# Patient Record
Sex: Female | Born: 1935 | Race: Black or African American | Hispanic: No | Marital: Married | Smoking: Never smoker
Health system: Southern US, Community
[De-identification: ages and names within clinical notes are randomized; demographics above are authoritative.]

## PROBLEM LIST (undated history)

## (undated) DIAGNOSIS — C801 Malignant (primary) neoplasm, unspecified: Secondary | ICD-10-CM

## (undated) DIAGNOSIS — M1990 Unspecified osteoarthritis, unspecified site: Secondary | ICD-10-CM

## (undated) DIAGNOSIS — F32A Depression, unspecified: Secondary | ICD-10-CM

## (undated) DIAGNOSIS — H269 Unspecified cataract: Secondary | ICD-10-CM

## (undated) DIAGNOSIS — E785 Hyperlipidemia, unspecified: Secondary | ICD-10-CM

## (undated) DIAGNOSIS — I10 Essential (primary) hypertension: Secondary | ICD-10-CM

## (undated) DIAGNOSIS — T7840XA Allergy, unspecified, initial encounter: Secondary | ICD-10-CM

## (undated) DIAGNOSIS — E119 Type 2 diabetes mellitus without complications: Secondary | ICD-10-CM

## (undated) DIAGNOSIS — K219 Gastro-esophageal reflux disease without esophagitis: Secondary | ICD-10-CM

## (undated) DIAGNOSIS — E079 Disorder of thyroid, unspecified: Secondary | ICD-10-CM

## (undated) HISTORY — DX: Disorder of thyroid, unspecified: E07.9

## (undated) HISTORY — DX: Unspecified osteoarthritis, unspecified site: M19.90

## (undated) HISTORY — PX: EYE SURGERY: SHX253

## (undated) HISTORY — DX: Hyperlipidemia, unspecified: E78.5

## (undated) HISTORY — PX: CATARACT EXTRACTION: SUR2

## (undated) HISTORY — DX: Malignant (primary) neoplasm, unspecified: C80.1

## (undated) HISTORY — DX: Allergy, unspecified, initial encounter: T78.40XA

## (undated) HISTORY — PX: BREAST BIOPSY: SHX20

## (undated) HISTORY — DX: Unspecified cataract: H26.9

## (undated) HISTORY — DX: Depression, unspecified: F32.A

## (undated) HISTORY — DX: Type 2 diabetes mellitus without complications: E11.9

## (undated) HISTORY — PX: TONSILLECTOMY: SUR1361

## (undated) HISTORY — PX: ABDOMINAL HYSTERECTOMY: SHX81

## (undated) HISTORY — DX: Essential (primary) hypertension: I10

## (undated) HISTORY — DX: Gastro-esophageal reflux disease without esophagitis: K21.9

---

## 1992-09-14 HISTORY — PX: THYROIDECTOMY, PARTIAL: SHX18

## 2002-04-04 ENCOUNTER — Ambulatory Visit (HOSPITAL_COMMUNITY): Admission: RE | Admit: 2002-04-04 | Discharge: 2002-04-04 | Payer: Self-pay | Admitting: Cardiology

## 2003-10-30 LAB — HM PAP SMEAR: HM PAP: NORMAL

## 2004-07-01 ENCOUNTER — Inpatient Hospital Stay (HOSPITAL_COMMUNITY): Admission: RE | Admit: 2004-07-01 | Discharge: 2004-07-03 | Payer: Self-pay | Admitting: Gynecology

## 2004-07-01 ENCOUNTER — Encounter (INDEPENDENT_AMBULATORY_CARE_PROVIDER_SITE_OTHER): Payer: Self-pay | Admitting: Specialist

## 2004-07-29 ENCOUNTER — Ambulatory Visit: Payer: Self-pay | Admitting: Family Medicine

## 2006-03-30 ENCOUNTER — Ambulatory Visit: Payer: Self-pay | Admitting: Family Medicine

## 2007-02-14 ENCOUNTER — Ambulatory Visit (HOSPITAL_COMMUNITY): Admission: RE | Admit: 2007-02-14 | Discharge: 2007-02-14 | Payer: Self-pay | Admitting: Cardiology

## 2007-06-28 ENCOUNTER — Ambulatory Visit: Payer: Self-pay | Admitting: Family Medicine

## 2008-01-03 ENCOUNTER — Ambulatory Visit: Payer: Self-pay | Admitting: Family Medicine

## 2008-11-28 ENCOUNTER — Ambulatory Visit: Payer: Self-pay | Admitting: Family Medicine

## 2010-02-13 ENCOUNTER — Ambulatory Visit: Payer: Self-pay | Admitting: Family Medicine

## 2010-09-23 ENCOUNTER — Ambulatory Visit (HOSPITAL_COMMUNITY)
Admission: RE | Admit: 2010-09-23 | Discharge: 2010-09-23 | Payer: Self-pay | Source: Home / Self Care | Attending: Ophthalmology | Admitting: Ophthalmology

## 2011-01-30 NOTE — H&P (Signed)
NAME:  [name], [name]              ACCOUNT NO.:  [id number]   MEDICAL RECORD NO.:  [id number]          PATIENT TYPE:  INP   LOCATION:  NA                            FACILITY:  [facility]   PHYSICIAN:  Ginger Carne, MD  DATE OF BIRTH:  Aug 21, 1933   DATE OF ADMISSION:  DATE OF DISCHARGE:                                HISTORY & PHYSICAL   ADMISSION DIAGNOSIS:  Postmenopausal bleeding with pedunculated uterine  fibroids/polyps.   IN-HOSPITAL PROCEDURE:  Total vaginal hysterectomy, bilateral salpingo-  oophorectomy, and possible laparoscopic assistance.   PRESENT HISTORY:  The patient is a 77 year old gravida 3, para 3-0-0-3,  African-American female admitted for the aforementioned procedures because  of postmenopausal bleeding.  The patient's primary care physician referred  said patient after a six to eight-week history of off and on spotting.  The  patient was noted on transvaginal ultrasound to have and on physical  inspection to have two large pedunculated leiomyoma, which apparently had  prolapsed through the endocervical canal and were present in the vagina.  They extended approximately 2-3 cm from the ectocervix.  The patient takes  no medications to enhance her bleeding propensity and has no personal or  family history of bleeding diatheses.  Endometrial biopsy revealed no  evidence of neoplasia or hyperplasia and findings were also demonstrating a  portion of a polyp.   OBSTETRIC/GYNECOLOGIC HISTORY:  The patient has had three full-term vaginal  deliveries in 1960, 1963, and 1970.  She had cryosurgery for dysplasia in  the 1970s.   MEDICAL HISTORY:  1.  Carcinoma of the thyroid in 1994.  2.  History of type 2 diabetes.  3.  Hypertension.   MEDICATIONS:  1.  Prilosec over-the-counter 20 mg daily.  2.  Glipizide ER 5 mg once a day.  3.  Accuretic 20/25 mg once a day.  4.  Synthroid 100 mcg daily.  5.  Lovastatin 40 mg at night.  6.  Vitamin C.  7.  Flax oil.  8.   Vitamin E.   FAMILY HISTORY:  Negative for breast, colon, ovarian, or uterine carcinoma  in first degree relatives.   The patient has no known allergies or sensitivities to iodine or latex.   OCCUPATION:  Retired.   SOCIAL HISTORY:  Negative for smoking, illicit drug abuse, or alcohol abuse.   SURGICAL HISTORY:  Thyroidectomy.   PHYSICAL EXAMINATION:  VITAL SIGNS:  Blood pressure is 120/60, weight 200  pounds, height 5 feet 3 inches.  HEENT:  Grossly normal.  BREASTS:  Without masses, discharge, thickenings, or tenderness.  CHEST:  Clear to percussion and auscultation.  CARDIOVASCULAR:  Without murmurs or enlargements, regular rate and rhythm.  EXTREMITIES, LYMPHATIC, SKIN, NEUROLOGIC, MUSCULOSKELETAL:  Systems within  normal limits.  ABDOMEN:  Soft without gross hepatosplenomegaly.  PELVIC:  External genitalia, vulva, and vagina normal.  Cervix smooth  without erosions or lesions on the cervix proper.  Present at the ectocervix  and extending about 2-3 cm from the ectocervix are two leiomyoma/polyps.  Uterus is small and appropriate for patient's age.  Both adnexa are  palpable, found to  be normal.  RECTAL:  Hemoccult-negative without masses.   IMPRESSION:  Postmenopausal bleeding secondary to prolapsed, pedunculated  uterine fibroids/polyps.   PLAN:  Discussed with patient the option of a hysteroscopic removal with  possibility of being unable to remove said base or stumps of said lesions,  which may cause further bleeding and/or further surgery.  The patient was  not open to the possibility of an incomplete resection and preferred a  vaginal hysterectomy and removal of both tubes and ovaries.  Risks,  including injuries to ureter, bowel, and bladder; possible conversion to a  laparoscopic or open procedure; hemorrhage possibly requiring blood  transfusion; infection; postoperative complications related to the patient's  medical diseases were discussed and understood by said  patient.  The patient  is therefore scheduled for a total vaginal hysterectomy and bilateral  salpingo-oophorectomy.  The nature of said procedure discussed in detail.  Pros and cons and options were also discussed thoroughly with patient and  husband and daughter.     Stev   SHB/MEDQ  D:  06/30/2004  T:  06/30/2004  Job:  [id number]

## 2011-01-30 NOTE — Op Note (Signed)
NAME:  [name] [name], [name] [name]              ACCOUNT NO.:  [id number]   MEDICAL RECORD NO.:  [id number]          PATIENT TYPE:  INP   LOCATION:  9399                          [facility]ACILITY:  [facility]   PHYSICIAN:  Ginger Carne, MD  DATE OF BIRTH:  [birth year]   DATE OF PROCEDURE:  DATE OF DISCHARGE:                                 OPERATIVE REPORT   PREOPERATIVE DIAGNOSIS:  Pedunculated prolapsed leiomyoma of the uterus and  subserosal leiomyoma.   POSTOPERATIVE DIAGNOSIS:  Pedunculated prolapsed leiomyoma of the uterus and  subserosal leiomyoma.   PROCEDURE:  Total vaginal hysterectomy with preservation of both tubes and  ovaries.   SURGEON:  Ginger Carne, M.D.   ASSISTANT:  Burnadette Peter, M.D.   ESTIMATED BLOOD LOSS:  Less than 50 to 75 mL.   COMPLICATIONS:  None immediate.   ANESTHESIA:  General anesthesia.   SPECIMENS:  Uterus and cervix.   FINDINGS:  Uterus was approximately four weeks in size compatible with post  menopausal size uterus.  There were two pedunculated fibroids than emanated  from the fundus with broad bases.  In addition, there was a 3 cm leiomyoma  on the subserosal intramural portion of the uterus at the level of the  fundus.  Ovaries and tubes were too high to safely remove vaginally.  Tissue  was somewhat friable through the course of the procedure and was deemed  inappropriate to attempt to remove said ovaries and incur the possibility of  further bleeding.   DESCRIPTION OF PROCEDURE:  The patient prepped and draped in usual fashion  and placed in the lithotomy position.  Betadine soap was used for antiseptic  and the patient was catheterized prior to the procedure.  After adequate  general anesthesia, anterior and posterior lifts of the cervix were grasped  with a double tooth tenaculum.  There were 20 mL of Marcaine with  epinephrine injected circumferentially around the cervix.  There were 2 cm  of anterior and posterior vaginal epithelium incised  transversely.  This was  then followed by opening the peritoneal reflections anteriorly and  posteriorly without injury to their respective organs.  Uterosacral cardinal  ligament complexes clamped, cut and ligated with 0 Vicryl suture.  This  extended to the uterine vasculature with its ascending branches.  Afterwards, the broad ligaments were clamped, cut and ligated with 0 Vicryl  suture. The round ligament was taken separately and utero-ovarian ligaments  clamped, cut and ligated with 0 Vicryl suture.  Bleeding points  hemostatically checked. Clots removed from the abdomen, closed with cuff in  one layer with 0 Vicryl running interlocking suture.  The patient tolerated  the procedure well and returned to the post anesthesia recovery room in  excellent condition.     Stev   SHB/MEDQ  D:  07/01/2004  T:  07/01/2004  Job:  [id number]

## 2011-01-30 NOTE — Cardiovascular Report (Signed)
[facility]  Patient:    [name], [name] Visit Number: [id number] MRN: [id number]          Service Type: DSU Location: [facility] 2859 [id number] Attending Physician:  Robynn Pane Dictated by:   Eduardo Osier Sharyn Lull, M.D. Proc. Date: 04/04/02 Admit Date:  04/04/2002 Discharge Date: 04/04/2002                          Cardiac Catheterization  PROCEDURES: 1. Left cardiac catheterization. 2. Selective left and right coronary angiography. 3. Left ventriculography via right groin using Judkins technique.  INDICATIONS FOR PROCEDURE:  The patient is a 77 year old black female with a past medical history significant for hypertension, diabetes mellitus, hypercholesterolemia, hypothyroidism, morbid obesity.  She is complains of retrosternal chest tightness, associated with nausea, vomiting, feeling weak for approximately two weeks.  The patient saw PMD in [place], [place] and subsequently had a stress Cardiolite at [facility], which showed partial fixed inferior wall defect with incomplete redistribution consistent with scar and peri-infarct ischemia in the inferior wall, with normal LV function.  The patient is referred for catheterization, possible PTC and stenting.  The patient denies any palpitations, lightheadedness or syncope.  No history of PND, orthopnea or leg swelling.  PAST MEDICAL HISTORY:  As above.  PAST SURGICAL HISTORY:  She had partial ________ many years ago.  Status post Bartholin cyst resection many years ago.  Status post tonsillectomy as a child.  ALLERGIES:  No known drug allergies.  HOME MEDICATIONS: 1. ________ 20/25 p.o. q.d. 2. Synthroid 0.1 mg p.o. q.d. 3. Glucotrol XL 2.5 mg p.o. q.d. 4. Lipitor 20 mg p.o. q.d. 5. Enteric-coated aspirin one p.o. q.d. 6. Nitrostat 0.4 mg sublingual as directed. 7. Norvasc (recently added) 5 mg p.o. q.d.  SOCIAL HISTORY:  She is married.  Has three children.  No smoking or alcohol abuse.   She is a retired [employer] features editor.  FAMILY HISTORY:  Negative for coronary artery disease.  PHYSICAL EXAMINATION:  GENERAL:  She was alert and oriented x3.  In no acute distress.  VITAL SIGNS:  Blood pressure 160/90, pulse 57 and regular.  HEENT:  Conjunctivae pink.  NECK:  Supple, no JVD and no bruit.  LUNGS:  Clear to auscultation without rhonchi or rales.  CARDIOVASCULAR:  S1, S2 normal.  There was a soft S4 gallop.  ABDOMEN:  Soft, bowel sounds were present.  Nontender.  EXTREMITIES:  There is no clubbing, cyanosis or edema.  IMPRESSION: 1. Accelerated angina, positive stress Cardiolite. 2. Uncontrolled hypertension. 3. Noninsulin-dependent diabetes mellitus. 4. Hypercholesterolemia. 5. Hypothyroidism. 6. Morbid obesity.  PLAN:  Discussed with the patient and her husband regarding left catheterization, possible PTCA and stenting; its risks including MI, stroke, need for emergency CABG, risks of restenosis, localized other complications. The patient has accepted and consented for PCI.  DESCRIPTION OF PROCEDURE:  After obtaining the informed consent, the patient was brought to the catheterization lab and was placed on the fluoroscopy table.  The right groin was prepped and draped in the usual fashion. Xylocaine 2% was used for local anesthesia in the right groin.  With the help of a thin-walled needle, a 6-French arterial sheath was placed.  The sheath was aspirated and flushed.  Next, 6-French left Judkins catheter was advanced over the wire under fluoroscopic guidance up to the ascending aorta.  The wire was pulled out. The catheter was aspirated and connected to the manifold.  The catheter was  further advanced and engaged into the right coronary ostium.  Multiple views of the left system were taken.  Next, the catheter was disengaged and was pulled out over the wire; replaced with 6-French right Judkins catheter.  This was advanced over the wire  under fluoroscopic guidance up to the ascending aorta.  The wire was pulled out. The catheter was aspirated and connected to the manifold. The catheter was further advanced and engaged into the right coronary ostium.  Multiple views of the right system were taken.  Next, the catheter was disengaged and was pulled out over the wire.  Replaced with 6-French pigtail catheter.  This was advanced over the wire under fluoroscopic guidance up to the ascending aorta.  The wire was pulled out, the catheter was aspirated and connected to the manifold.  The catheter was further advanced across the aortic valve into the LV.  LV pressures were recorded.  Next, left ventriculography was done in the 30-degree RAO position. Post-angiographic pressures were recorded from the LV and then pullback pressures were recorded from the aorta.  There was no gradient across the aortic valve.  Next, the pigtail catheter was pulled out over the wire and the sheaths were aspirated and flushed.  FINDINGS:  LEFT VENTRICULOGRAM:  The left ventricle showed good LV systolic function.  EF of 55-60%.  CORONARIES: 1. LEFT MAIN:  Has 10% ostial stenosis. 2. LEFT ANTERIOR DESCENDING:  Patent.  The first diagonal branch was medium    sized and had 20-25% ostial stenosis. 3. LEFT CIRCUMFLEX:  Large and patent.  This tapers down after giving    off OM2 in the AV groove.  OM1 is very small but is patent.  OM2 is    medium sized and patent. 4. RIGHT CORONARY:  Has 15-20% ostial and proximal stenosis.  There is    20-25% mid focal stenosis, with haziness and TIMI-3 distal flow.  DISPOSITION:  The patient tolerated the procedure well.  There were no complications.  PLAN:  Treat medically. Dictated by:   Eduardo Osier Sharyn Lull, M.D. Attending Physician:  Robynn Pane DD:  04/04/02 TD:  04/08/02 Job: [id number]

## 2011-01-30 NOTE — Discharge Summary (Signed)
NAME:  [name] [name], [name] [name]              ACCOUNT NO.:  [id number]   MEDICAL RECORD NO.:  [id number]          PATIENT TYPE:  INP   LOCATION:  9307                          [facility]ILITY:  [facility]   PHYSICIAN:  Ginger Carne, MD  DATE OF BIRTH:  Jun 24, 1933   DATE OF ADMISSION:  07/01/2004  DATE OF DISCHARGE:  07/03/2004                                 DISCHARGE SUMMARY   FINAL DIAGNOSES:  Postmenopausal bleeding with pedunculated uterine  fibroids.   IN HOSPITAL PROCEDURES:  Total vaginal hysterectomy with preservation of  both tubes and ovaries.   HOSPITAL COURSE:  This is a 77 year old African-American female who  underwent the aforementioned procedures on the 18th of October 2005. Her  postoperative course was uneventful.  Ovaries were not removed due to the  high position of said ovaries and the risk of bleeding.  She was afebrile,  voided well, minimal vaginal flow, calves without tenderness, lungs clear.  Her postoperative hemoglobin was 11.3 from a preop of 13.8 and 33.3,  hematocrit from 41.9 preop. Routine postoperative instructions provided to  said patient, Vicodin provided for analgesia.  The patient was asked to  return to the office in four weeks and to contact the office for temperature  elevation about 100.4 degrees centigrade, increased vaginal discharge,  burning  on urination, problems with bowel function and/or any other  concerns or complaints.     Stev   SHB/MEDQ  D:  07/03/2004  T:  07/03/2004  Job:  [id number]

## 2013-03-20 ENCOUNTER — Ambulatory Visit: Payer: Self-pay | Admitting: Family Medicine

## 2013-04-07 ENCOUNTER — Ambulatory Visit: Payer: Self-pay | Admitting: Orthopedic Surgery

## 2014-03-26 DIAGNOSIS — M48061 Spinal stenosis, lumbar region without neurogenic claudication: Secondary | ICD-10-CM | POA: Insufficient documentation

## 2014-03-26 DIAGNOSIS — M5116 Intervertebral disc disorders with radiculopathy, lumbar region: Secondary | ICD-10-CM | POA: Insufficient documentation

## 2014-03-27 DIAGNOSIS — M5136 Other intervertebral disc degeneration, lumbar region: Secondary | ICD-10-CM | POA: Insufficient documentation

## 2014-04-28 LAB — HM MAMMOGRAPHY: HM MAMMO: NORMAL

## 2014-10-02 DIAGNOSIS — E119 Type 2 diabetes mellitus without complications: Secondary | ICD-10-CM | POA: Diagnosis not present

## 2014-10-18 DIAGNOSIS — M5416 Radiculopathy, lumbar region: Secondary | ICD-10-CM | POA: Diagnosis not present

## 2014-10-18 DIAGNOSIS — M4806 Spinal stenosis, lumbar region: Secondary | ICD-10-CM | POA: Diagnosis not present

## 2014-12-17 DIAGNOSIS — Z23 Encounter for immunization: Secondary | ICD-10-CM | POA: Diagnosis not present

## 2014-12-17 DIAGNOSIS — E119 Type 2 diabetes mellitus without complications: Secondary | ICD-10-CM | POA: Diagnosis not present

## 2014-12-17 DIAGNOSIS — R5383 Other fatigue: Secondary | ICD-10-CM | POA: Diagnosis not present

## 2014-12-17 DIAGNOSIS — E785 Hyperlipidemia, unspecified: Secondary | ICD-10-CM | POA: Diagnosis not present

## 2014-12-17 DIAGNOSIS — I10 Essential (primary) hypertension: Secondary | ICD-10-CM | POA: Diagnosis not present

## 2014-12-17 DIAGNOSIS — E039 Hypothyroidism, unspecified: Secondary | ICD-10-CM | POA: Diagnosis not present

## 2014-12-17 DIAGNOSIS — Z0000 Encounter for general adult medical examination without abnormal findings: Secondary | ICD-10-CM | POA: Diagnosis not present

## 2014-12-17 DIAGNOSIS — E538 Deficiency of other specified B group vitamins: Secondary | ICD-10-CM | POA: Diagnosis not present

## 2014-12-17 LAB — LIPID PANEL
Cholesterol: 219 mg/dL — AB (ref 0–200)
HDL: 48 mg/dL (ref 35–70)
LDL Cholesterol: 152 mg/dL
Triglycerides: 97 mg/dL (ref 40–160)

## 2014-12-17 LAB — TSH: TSH: 0.74 u[IU]/mL (ref <=5.90)

## 2014-12-17 LAB — HEMOGLOBIN A1C: HEMOGLOBIN A1C: 6.4 % — AB (ref 4.0–6.0)

## 2014-12-31 DIAGNOSIS — R42 Dizziness and giddiness: Secondary | ICD-10-CM | POA: Insufficient documentation

## 2014-12-31 DIAGNOSIS — M549 Dorsalgia, unspecified: Secondary | ICD-10-CM | POA: Insufficient documentation

## 2014-12-31 DIAGNOSIS — D249 Benign neoplasm of unspecified breast: Secondary | ICD-10-CM | POA: Insufficient documentation

## 2014-12-31 DIAGNOSIS — E039 Hypothyroidism, unspecified: Secondary | ICD-10-CM | POA: Insufficient documentation

## 2014-12-31 DIAGNOSIS — M1990 Unspecified osteoarthritis, unspecified site: Secondary | ICD-10-CM | POA: Insufficient documentation

## 2014-12-31 DIAGNOSIS — B351 Tinea unguium: Secondary | ICD-10-CM | POA: Diagnosis not present

## 2014-12-31 DIAGNOSIS — E7849 Other hyperlipidemia: Secondary | ICD-10-CM | POA: Insufficient documentation

## 2014-12-31 DIAGNOSIS — M76899 Other specified enthesopathies of unspecified lower limb, excluding foot: Secondary | ICD-10-CM | POA: Insufficient documentation

## 2014-12-31 DIAGNOSIS — E1142 Type 2 diabetes mellitus with diabetic polyneuropathy: Secondary | ICD-10-CM | POA: Diagnosis not present

## 2014-12-31 DIAGNOSIS — R600 Localized edema: Secondary | ICD-10-CM | POA: Diagnosis not present

## 2014-12-31 DIAGNOSIS — M5186 Other intervertebral disc disorders, lumbar region: Secondary | ICD-10-CM | POA: Insufficient documentation

## 2014-12-31 DIAGNOSIS — M25559 Pain in unspecified hip: Secondary | ICD-10-CM | POA: Insufficient documentation

## 2015-01-14 DIAGNOSIS — H2513 Age-related nuclear cataract, bilateral: Secondary | ICD-10-CM | POA: Diagnosis not present

## 2015-01-17 DIAGNOSIS — E119 Type 2 diabetes mellitus without complications: Secondary | ICD-10-CM | POA: Diagnosis not present

## 2015-02-08 DIAGNOSIS — E119 Type 2 diabetes mellitus without complications: Secondary | ICD-10-CM | POA: Diagnosis not present

## 2015-03-27 DIAGNOSIS — E039 Hypothyroidism, unspecified: Secondary | ICD-10-CM | POA: Insufficient documentation

## 2015-03-27 DIAGNOSIS — E538 Deficiency of other specified B group vitamins: Secondary | ICD-10-CM | POA: Insufficient documentation

## 2015-03-27 DIAGNOSIS — D249 Benign neoplasm of unspecified breast: Secondary | ICD-10-CM | POA: Insufficient documentation

## 2015-03-27 DIAGNOSIS — M519 Unspecified thoracic, thoracolumbar and lumbosacral intervertebral disc disorder: Secondary | ICD-10-CM | POA: Insufficient documentation

## 2015-03-27 DIAGNOSIS — R609 Edema, unspecified: Secondary | ICD-10-CM | POA: Insufficient documentation

## 2015-03-27 DIAGNOSIS — M48061 Spinal stenosis, lumbar region without neurogenic claudication: Secondary | ICD-10-CM | POA: Insufficient documentation

## 2015-03-27 DIAGNOSIS — M1990 Unspecified osteoarthritis, unspecified site: Secondary | ICD-10-CM | POA: Insufficient documentation

## 2015-03-27 DIAGNOSIS — M7070 Other bursitis of hip, unspecified hip: Secondary | ICD-10-CM | POA: Insufficient documentation

## 2015-03-27 DIAGNOSIS — M7550 Bursitis of unspecified shoulder: Secondary | ICD-10-CM | POA: Insufficient documentation

## 2015-03-27 DIAGNOSIS — E739 Lactose intolerance, unspecified: Secondary | ICD-10-CM | POA: Insufficient documentation

## 2015-03-27 DIAGNOSIS — R351 Nocturia: Secondary | ICD-10-CM | POA: Insufficient documentation

## 2015-03-27 DIAGNOSIS — E785 Hyperlipidemia, unspecified: Secondary | ICD-10-CM | POA: Insufficient documentation

## 2015-03-27 DIAGNOSIS — I10 Essential (primary) hypertension: Secondary | ICD-10-CM | POA: Insufficient documentation

## 2015-03-27 DIAGNOSIS — E1149 Type 2 diabetes mellitus with other diabetic neurological complication: Secondary | ICD-10-CM | POA: Insufficient documentation

## 2015-04-01 ENCOUNTER — Encounter: Payer: Self-pay | Admitting: Family Medicine

## 2015-04-01 ENCOUNTER — Ambulatory Visit (INDEPENDENT_AMBULATORY_CARE_PROVIDER_SITE_OTHER): Payer: Commercial Managed Care - HMO | Admitting: Family Medicine

## 2015-04-01 VITALS — BP 144/74 | HR 58 | Temp 98.9°F | Resp 16 | Wt 179.0 lb

## 2015-04-01 DIAGNOSIS — E785 Hyperlipidemia, unspecified: Secondary | ICD-10-CM | POA: Diagnosis not present

## 2015-04-01 DIAGNOSIS — E039 Hypothyroidism, unspecified: Secondary | ICD-10-CM

## 2015-04-01 DIAGNOSIS — R358 Other polyuria: Secondary | ICD-10-CM

## 2015-04-01 DIAGNOSIS — M519 Unspecified thoracic, thoracolumbar and lumbosacral intervertebral disc disorder: Secondary | ICD-10-CM

## 2015-04-01 DIAGNOSIS — R3589 Other polyuria: Secondary | ICD-10-CM | POA: Insufficient documentation

## 2015-04-01 DIAGNOSIS — E119 Type 2 diabetes mellitus without complications: Secondary | ICD-10-CM | POA: Diagnosis not present

## 2015-04-01 DIAGNOSIS — I10 Essential (primary) hypertension: Secondary | ICD-10-CM | POA: Diagnosis not present

## 2015-04-01 LAB — POCT URINALYSIS DIPSTICK
Bilirubin, UA: NEGATIVE
Glucose, UA: NEGATIVE
KETONES UA: NEGATIVE
Leukocytes, UA: NEGATIVE
NITRITE UA: NEGATIVE
PROTEIN UA: NEGATIVE
RBC UA: NEGATIVE
Spec Grav, UA: 1.01
UROBILINOGEN UA: 0.2
pH, UA: 7

## 2015-04-01 MED ORDER — SOLIFENACIN SUCCINATE 5 MG PO TABS: 5.0000 mg | ORAL_TABLET | Freq: Every day | ORAL | Status: DC

## 2015-04-01 NOTE — Progress Notes (Signed)
Patient: [name] [name] Female    DOB: [birth year]   81 y.o.   MRN: [id number] Visit Date: 04/01/2015  Today's Provider: Lelon Huh, MD   Chief Complaint  Patient presents with  . Follow-up    Medication and labs  . Hypertension  . Hyperlipidemia  . Diabetes  . Hypothyroidism   Subjective:    Hypertension This is a chronic problem. The current episode started more than 1 year ago. The problem is controlled. Associated symptoms include headaches and malaise/fatigue. Pertinent negatives include no chest pain, palpitations or shortness of breath. Risk factors for coronary artery disease include diabetes mellitus.  Hyperlipidemia This is a chronic problem. The current episode started more than 1 year ago. Exacerbating diseases include diabetes and hypothyroidism. Associated symptoms include leg pain. Pertinent negatives include no chest pain or shortness of breath.  Diabetes She presents for her follow-up diabetic visit. She has type 2 diabetes mellitus. Hypoglycemia symptoms include headaches. Pertinent negatives for hypoglycemia include no dizziness. Associated symptoms include fatigue and weakness. Pertinent negatives for diabetes include no chest pain. Risk factors for coronary artery disease include hypertension.     Diabetes Mellitus Type II, Follow-up:   Lab Results  Component Value Date   HGBA1C 6.4* 12/17/2014   Last seen for diabetes 3 months ago.  Management changes included none. She reports good compliance with treatment. She is not having side effects. none Home blood sugar records: fasting range: 76  Episodes of hypoglycemia? no   Current exercise: none  ------------------------------------------------------------------------   Hypertension, follow-up:  BP Readings from Last 3 Encounters:  04/01/15 144/74  12/17/14 130/72    She was last seen for hypertension 3 months ago.  BP at that visit was 130/72. Management changes since that visit  include none .She reports good compliance with treatment. She is not having side effects. npne  She is not exercising. She is adherent to low salt diet.   Outside blood pressures are none. She is experiencing none.  Use of agents associated with hypertension: none.   ------------------------------------------------------------------------    Lipid/Cholesterol, Follow-up:   Last seen for this 3 months ago.  Management changes since that visit include continue Zetia, discontinue HCTZ. Added Furosemide 20 mg qd.  Last Lipid Panel:    Component Value Date/Time   CHOL 219* 12/17/2014   TRIG 97 12/17/2014   HDL 48 12/17/2014   LDLCALC 152 12/17/2014    She reports good compliance with treatment. She is not having side effects. none  Wt Readings from Last 3 Encounters:  04/01/15 179 lb (81.194 kg)  12/17/14 177 lb (80.287 kg)    ------------------------------------------------------------------------   Polyuria She states she continues to have to void ever couple of hours during the day, and several times throughout the night. She has stopped hctz but continues furosemide due to chronic edema of LEs, and finds that it is effective.     Allergies  Allergen Reactions  . Lovastatin     Weakness  . Pravastatin Sodium     Muscle weakness  . Simvastatin     Fatigue   Previous Medications   AMLODIPINE (NORVASC) 5 MG TABLET    Take 1 tablet by mouth daily.   ASPIRIN 81 MG TABLET    Take 1 tablet by mouth daily.   CHOLECALCIFEROL (VITAMIN D) 1000 UNITS TABLET    Take 1 tablet by mouth daily.   EZETIMIBE (ZETIA) 10 MG TABLET    Take 10 mg by mouth daily.  FUROSEMIDE (LASIX) 20 MG TABLET    Take 20 mg by mouth.   GLIPIZIDE (GLUCOTROL XL) 10 MG 24 HR TABLET    Take 1 tablet by mouth daily.   LEVOTHYROXINE (SYNTHROID, LEVOTHROID) 75 MCG TABLET    Take 1 tablet by mouth daily.   METFORMIN (GLUCOPHAGE-XR) 750 MG 24 HR TABLET    Take 1 tablet by mouth 2 (two) times daily.    OMEGA-3 FATTY ACIDS (FISH OIL) 1000 MG CAPS    Take 1 capsule by mouth daily.   QUINAPRIL (ACCUPRIL) 40 MG TABLET    Take 2 tablets by mouth daily.   VITAMIN B-12 (CYANOCOBALAMIN) 100 MCG TABLET    Take 1 tablet by mouth daily.    Review of Systems  Constitutional: Positive for malaise/fatigue and fatigue.  Respiratory: Negative for shortness of breath.   Cardiovascular: Positive for leg swelling. Negative for chest pain and palpitations.  Genitourinary: Positive for frequency.  Musculoskeletal: Positive for back pain and joint swelling.  Neurological: Positive for weakness, light-headedness and headaches. Negative for dizziness.    History  Substance Use Topics  . Smoking status: Never Smoker   . Smokeless tobacco: Not on file  . Alcohol Use: No   Objective:   BP 144/74 mmHg  Pulse 58  Temp(Src) 98.9 F (37.2 C) (Oral)  Resp 16  Wt 179 lb (81.194 kg)  SpO2 97%  Physical Exam  General Appearance:    Alert, cooperative, no distress  Eyes:    PERRL, conjunctiva/corneas clear, EOM's intact       Lungs:     Clear to auscultation bilaterally, respirations unlabored  Heart:    Regular rate and rhythm  Neurologic:   Awake, alert, oriented x 3. No apparent focal neurological           defect.           Assessment & Plan:      1. HLD (hyperlipidemia) She is tolerating Zetia well, but is expensive. She wants to see how well it is working before refilling prescription.  - Lipid panel  2. Essential hypertension well controlled Continue current medications.    3. Hypothyroidism, unspecified hypothyroidism type   4. Polyuria Try samples of Vesicare 5mg  daily #14.  - POCT urinalysis dipstick  5. Lumbar disc disease Has had low back pain for years and tried shots from Dr. Sharlet Salina with minimal improvement. She ha been very reluctant to try PT, but is now willing since nothing else has helped.  - Ambulatory referral to Physical Therapy  6. Type 2 diabetes mellitus without  complication  - Hemoglobin A1c          Lelon Huh, MD  [facility]

## 2015-04-02 LAB — LIPID PANEL
CHOL/HDL RATIO: 4.9 ratio — AB (ref 0.0–4.4)
CHOLESTEROL TOTAL: 202 mg/dL — AB (ref 100–199)
HDL: 41 mg/dL (ref >39)
LDL Calculated: 140 mg/dL — ABNORMAL HIGH (ref 0–99)
TRIGLYCERIDES: 105 mg/dL (ref 0–149)
VLDL CHOLESTEROL CAL: 21 mg/dL (ref 5–40)

## 2015-04-02 LAB — HEMOGLOBIN A1C
ESTIMATED AVERAGE GLUCOSE: 146 mg/dL
Hgb A1c MFr Bld: 6.7 % — ABNORMAL HIGH (ref 4.8–5.6)

## 2015-04-15 ENCOUNTER — Telehealth: Payer: Self-pay | Admitting: Family Medicine

## 2015-04-15 NOTE — Telephone Encounter (Signed)
It does not sound like a dangerous reaction, so it just depends if she thinks it is helping and how much the side effects bother her. However, I've never heard of a reaction llke this before, so I'm not sure the medications is causing it.

## 2015-04-15 NOTE — Telephone Encounter (Signed)
Pt states she started the Rx solifenacin (VESICARE) 5 MG and pt states she is having a reaction to this medication.  Pt was having a biting feeling on your right arm and back shoulder to her leg.  Pt did not take a pill this morning.  Pt is asking if she needs to stop taking this?  MO#[phone]/PT

## 2015-04-15 NOTE — Telephone Encounter (Signed)
Patient's daughter was notified. Daughter stated that they were going to wait a few days to see how patient is doing and then let us know.

## 2015-04-18 ENCOUNTER — Ambulatory Visit: Payer: Commercial Managed Care - HMO | Admitting: Physical Therapy

## 2015-04-22 ENCOUNTER — Encounter: Payer: Self-pay | Admitting: Physical Therapy

## 2015-04-24 ENCOUNTER — Encounter: Payer: Self-pay | Admitting: Physical Therapy

## 2015-04-29 ENCOUNTER — Ambulatory Visit: Payer: Commercial Managed Care - HMO | Attending: Family Medicine

## 2015-04-29 ENCOUNTER — Encounter: Payer: Self-pay | Admitting: Physical Therapy

## 2015-04-29 DIAGNOSIS — M519 Unspecified thoracic, thoracolumbar and lumbosacral intervertebral disc disorder: Secondary | ICD-10-CM | POA: Diagnosis not present

## 2015-04-29 DIAGNOSIS — R262 Difficulty in walking, not elsewhere classified: Secondary | ICD-10-CM | POA: Insufficient documentation

## 2015-04-29 DIAGNOSIS — R531 Weakness: Secondary | ICD-10-CM | POA: Insufficient documentation

## 2015-04-29 DIAGNOSIS — M545 Low back pain: Secondary | ICD-10-CM | POA: Insufficient documentation

## 2015-04-29 DIAGNOSIS — E119 Type 2 diabetes mellitus without complications: Secondary | ICD-10-CM | POA: Diagnosis not present

## 2015-04-29 NOTE — Patient Instructions (Addendum)
Adduction: Hip - Knees Together (Sitting)   Sit with towel roll between knees. Push knees together. Hold for _5__ seconds. Make sure you breathe!  Repeat __10_ times. Do _3__ times a day.  Copyright  VHI. All rights reserved.   Scapular Retraction: Bilateral   Pull arms back, bringing shoulder blades together.   Hold position for 5 seconds. Breathe!  Repeat __10__ times per set. Do __3__ sets per session. Do _1___ sessions per day.  http://orth.exer.us/177   Copyright  VHI. All rights reserved.    Improved exercise technique, movement at target joints, use of target muscles after mod verbal, visual, tactile cues.

## 2015-04-29 NOTE — Therapy (Signed)
[place] [facility] [address], [place], [address] Phone: [phone]   Fax:  [phone]  Physical Therapy Evaluation  Patient Details  Name: [name] [name] MRN: [id number] Date of Birth: 02-26-1933 Referring Provider:  Birdie Sons, MD  Encounter Date: 04/29/2015      PT End of Session - 04/29/15 0814    Visit Number 1   Number of Visits 9   Date for PT Re-Evaluation 05/30/15   Authorization Type 1   Authorization Time Period of 10   PT Start Time 0814   PT Stop Time 0955   PT Time Calculation (min) 101 min   Activity Tolerance Patient tolerated treatment well   Behavior During Therapy [facility] for tasks assessed/performed      Past Medical History  Diagnosis Date  . Hyperlipidemia   . Hypertension   . Diabetes mellitus without complication   . Arthritis   . Allergy   . Thyroid disease     Past Surgical History  Procedure Laterality Date  . Abdominal hysterectomy    . Thyroidectomy, partial  1994  . Tonsillectomy      There were no vitals filed for this visit.  Visit Diagnosis:  Lumbar disc disease - Plan: PT plan of care cert/re-cert  Bilateral low back pain, with sciatica presence unspecified - Plan: PT plan of care cert/re-cert  Weakness - Plan: PT plan of care cert/re-cert  Difficulty walking - Plan: PT plan of care cert/re-cert      Subjective Assessment - 04/29/15 0816    Subjective Back pain currently 6/10 (pt sitting on chair), best 8/10 (per pt; pt states feeling stress on her back)   Pertinent History Pt came to physical therapy secondary to low back pain. L LE symptoms (coldness) started bothering her first (over 5 years ago). Got cortisone shots x2 which helped. Started having symptoms on her right side as well. Currently walks with  a cane and a rw for balance. Difficulty sleeping on her R. Has to sleep on her back now with pillows under her knees. Feels weakness with her R  LE. Has to step up with her L LE when negotiating stairs due to R LE weakness. Pt also adds that an office chair rolled out from underneath her 5 years ago which might have increased her L LE "coldness" symptoms. Had X-rays and MRIs with which pt was diagnosed with spinal stenosis.  Uses a SPC for balance.  Pt daughter states that her mom is trying to maintain the ROM she has at her age.  Denies saddle anesthesia symptoms. Wears depends diapers for incontinence.    Patient Stated Goals "I would like to not use the cane and walk (secondary to LE weakness)." Try to work on balance.    Currently in Pain? Yes   Pain Score 6    Pain Location Back  and bilateral LE   Pain Orientation Lower   Pain Radiating Towards bilateral LE   Aggravating Factors  standing, cooking in the kitchen for 30-45 min, sitting for > 30 min (causes stiffness),    Multiple Pain Sites Yes  low back, L Merlene Pulling          [facility] PT Assessment - 04/29/15 0836    Assessment   Medical Diagnosis lumbar disc disease   Onset Date/Surgical Date 04/01/15  date MD ordered physical therapy   Prior Therapy None for current condition   Precautions   Precaution Comments  no known precautions   Restrictions   Other Position/Activity Restrictions no known restrictions   Balance Screen   Has the patient fallen in the past 6 months No   Has the patient had a decrease in activity level because of a fear of falling?  Yes   Is the patient reluctant to leave their home because of a fear of falling?  No  so long as pt had a SPC   Prior Function   Vocation Retired   [employer] PLOF: better able to stand and cook, walk, tolerate sitting   Observation/Other Assessments   Observations anterior/posterior sway at ankles during standing without shoes and SPC.  Decreased sensation to light touch to R L5 (at foot), L L4 (at foot), decreased sensation to light touch R L1, L2, L3 dermatomes compared to L LE. Swelling bilateral LE    Modified [place] 62% (extreme disability)   Posture/Postural Control   Posture Comments L low back side bend at L3/L4, R lumbar side bend at L2/L3. Decreased lumbar lordosis. R iliac crest slightly higher, decreased bilateral hip extension, bilaterally protracted shoulders and neck, bilaterally prontated feet   AROM   Lumbar Flexion limited, no symptoms   Lumbar Extension limited with bilateral LE nerve tension   Lumbar - Right Side Bend Limited with L5/S1 pulling sensation   Lumbar - Left Side Bend Limited with L5/S1 pulling sensation   Lumbar - Right Rotation limited with stress at her thoracolumbar junction   Lumbar - Left Rotation limited with stress at the thoracolumbar junction   Strength   Right Hip Flexion 4/5   Right Hip ABduction 4/5  sitting with hips < 90 degrees abduction   Left Hip Flexion 4-/5   Left Hip ABduction 4/5  sitting, hips < 90 degrees flexion   Right Knee Flexion 4/5   Right Knee Extension 5/5   Left Knee Flexion 4-/5   Left Knee Extension 5/5   Right Ankle Dorsiflexion 4+/5   Right Ankle Plantar Flexion 4/5   Left Ankle Dorsiflexion 4+/5   Left Ankle Plantar Flexion 4/5   Palpation   Palpation comment TTP R sacral ala; and around L3 in standing. R rotated L2, L rotated L3, R sacral ala more palpable. No step off deformity   Ambulation/Gait   Gait Comments SPC R LE, antalgic, decreased stance R LE, L lateral lean           [facility] Adult PT Treatment/Exercise - 04/29/15 0836    Exercises   Other Exercises  Directed patient with seated hip adductor pillow squeeze 10x2 with 5 second holds, seated bilateral scapular retraction 10x3 with 5 second holds. Reviewed aforementioned exercises and gave them as part her HEP. Pt demonstrated and verbalized understanding.            PT Education - 04/29/15 1921    Education provided Yes   Education Details ther-ex, HEP, plan of care, low back position, thoracic posture, hip extension, hip strength and back pain    Person(s) Educated Patient;Child(ren)  and daughter   Methods Explanation;Demonstration;Tactile cues;Verbal cues;Handout   Comprehension Verbalized understanding;Returned demonstration            PT Long Term Goals - 04/29/15 1642    PT LONG TERM GOAL #1   Title Pt will be independent with her HEP to help decrease back pain and increase ability to perform standing tasks such as cooking.   Time 4   Period Weeks   Status New   PT LONG  TERM GOAL #2   Title Patient will improve her LEFS score by at least 9 points as a demonstration of improved function.    Time 4   Period Weeks   Status New   PT LONG TERM GOAL #3   Title Patient will improve bilateral LE strength by 1/2 MMT grade to help decrease back and bilateral LE pain as well as to improve ability to perform standing tasks and to ambulate.   Time 4   Period Weeks   Status New           Plan - 05/03/2015 1629    Clinical Impression Statement Patient is an 81 year old female who came to physical therapy secondary to back pain. Patient also presents with LE symptoms, altered gait pattern and posture (including altered lumbar vertebral posture), LE weakness, TTP to low back, and difficulty performing functional tasks such as standing and cooking in the kitchen, walking, and maintaining positions such as sitting. Patient will benefit from skilled physical therapy intervention to address the aforementioned deficits.    Pt will benefit from skilled therapeutic intervention in order to improve on the following deficits Difficulty walking;Decreased range of motion;Pain;Improper body mechanics;Hypomobility;Decreased strength   Rehab Potential Good   Clinical Impairments Affecting Rehab Potential age   PT Frequency 2x / week   PT Duration 4 weeks   PT Treatment/Interventions Therapeutic exercise;Therapeutic activities;Manual techniques;Moist Heat;Electrical Stimulation;Cryotherapy;Patient/family education;Gait training;Neuromuscular  re-education;Ultrasound;ADLs/Self Care Home Management   PT Next Visit Plan thoracic mobility, hip strengthening, manual therapy   Consulted and Agree with Plan of Care Patient;Family member/caregiver   Family Member Consulted daugther          G-Codes - May 03, 2015 1635    Functional Assessment Tool Used LEFS, patient interview   Functional Limitation Mobility: Walking and moving around   Mobility: Walking and Moving Around Current Status [phone]) At least 60 percent but less than 80 percent impaired, limited or restricted   Mobility: Walking and Moving Around Goal Status [phone]) At least 20 percent but less than 40 percent impaired, limited or restricted       Problem List Patient Active Problem List   Diagnosis Date Noted  . Polyuria 04/01/2015  . Breast fibroadenoma 03/27/2015  . Bursitis of hip 03/27/2015  . Diabetes mellitus, type 2 03/27/2015  . Edema 03/27/2015  . HLD (hyperlipidemia) 03/27/2015  . Hypertension 03/27/2015  . Hypothyroid 03/27/2015  . Lactose intolerance 03/27/2015  . Lumbar disc disease 03/27/2015  . Nocturia 03/27/2015  . Osteoarthritis 03/27/2015  . Lumbar canal stenosis 03/27/2015  . Subcoracoid bursitis 03/27/2015  . B12 deficiency 03/27/2015     Thank you for your referral.   Joneen Boers PT, DPT   05-03-15, 7:32 PM  [place] [facility] [address] [place], [address] Phone: [phone]   Fax:  [phone]

## 2015-04-30 ENCOUNTER — Other Ambulatory Visit: Payer: Self-pay | Admitting: Family Medicine

## 2015-04-30 MED ORDER — FUROSEMIDE 20 MG PO TABS: 20.0000 mg | ORAL_TABLET | Freq: Every day | ORAL | Status: DC

## 2015-04-30 NOTE — Telephone Encounter (Signed)
Refill request for furosemide 20 mg Last filled by MD on- 01/04/2015 #90 x0 Last Appt: 04/01/2015 Next Appt: 05/27/2015 Please advise refill?

## 2015-04-30 NOTE — Telephone Encounter (Signed)
Pt called back and says she needs the RX now.  Could you please send 30 days to [facility] [address]   She said her feet are swollen now.  She has an appt. Sept 12th.  Thanks, Con Memos  She wants you to send the mail order in also.

## 2015-04-30 NOTE — Telephone Encounter (Signed)
Pt contacted office for refill request on the following medications:  furosemide (LASIX) 20 MG.  [facility] mail order.  LA#[id number]

## 2015-05-01 ENCOUNTER — Encounter: Payer: Self-pay | Admitting: Physical Therapy

## 2015-05-01 ENCOUNTER — Ambulatory Visit: Payer: Commercial Managed Care - HMO

## 2015-05-01 DIAGNOSIS — R262 Difficulty in walking, not elsewhere classified: Secondary | ICD-10-CM | POA: Diagnosis not present

## 2015-05-01 DIAGNOSIS — M545 Low back pain: Secondary | ICD-10-CM | POA: Diagnosis not present

## 2015-05-01 DIAGNOSIS — M519 Unspecified thoracic, thoracolumbar and lumbosacral intervertebral disc disorder: Secondary | ICD-10-CM | POA: Diagnosis not present

## 2015-05-01 DIAGNOSIS — R531 Weakness: Secondary | ICD-10-CM | POA: Diagnosis not present

## 2015-05-01 NOTE — Therapy (Signed)
[place] [facility] [address], [place], [address] Phone: [phone]   Fax:  [phone]  Physical Therapy Treatment  Patient Details  Name: [name] [name] MRN: [id number] Date of Birth: 1932/09/19 Referring Provider:  Birdie Sons, MD  Encounter Date: 05/01/2015      PT End of Session - 05/01/15 1044    Visit Number 1   Number of Visits 9   Date for PT Re-Evaluation 05/30/15   Authorization Type 1   Authorization Time Period of 10   PT Start Time 1045   PT Stop Time 1144   PT Time Calculation (min) 59 min   Activity Tolerance Patient tolerated treatment well   Behavior During Therapy [facility] for tasks assessed/performed      Past Medical History  Diagnosis Date  . Hyperlipidemia   . Hypertension   . Diabetes mellitus without complication   . Arthritis   . Allergy   . Thyroid disease     Past Surgical History  Procedure Laterality Date  . Abdominal hysterectomy    . Thyroidectomy, partial  1994  . Tonsillectomy      There were no vitals filed for this visit.  Visit Diagnosis:  Lumbar disc disease  Bilateral low back pain, with sciatica presence unspecified  Weakness  Difficulty walking      Subjective Assessment - 05/01/15 1155    Subjective Pt states that her back feels stiff this morning. Had a shot for her L hip when her symptoms started which helped. Had a shot in her R hip in February 2016 which only helped temporarily. One leg is also longer than the other.    Patient Stated Goals "I would like to not use the cane and walk (secondary to LE weakness)." Try to work on balance.    Currently in Pain? Yes   Pain Score --  No pain level provided   Pain Location Back  low back, L and R LE   Multiple Pain Sites Yes  low back, R and L LE           [facility] Adult PT Treatment/Exercise - 05/01/15 1117    Exercises   Other Exercises  Directed patient with sitting on physio ball  anterior/posterior pelvic tilts 10x3, side to side pelvic tilts 10x3; seated on table L trunk rotation 10x2 with 5 second holds each. seated R and L hip extension isometrics 10x3 with 5 second holds, seated L trunk rotation 3x30 seconds, sitting with proper posture bilateral scapular retraction 10x3 with 5 second holds   Manual Therapy   Manual therapy comments Seated: SNAGs R rotation with P to A pressure to L L3, and L5 transverse processes about 7-10x2; SNAGs L rotation with P to A pressure to R L3 transverse process about 10x2; anterior pelvic tilt with gentle P to A pressure to R sacral ala 10x3            PT Education - 05/01/15 1213    Education provided Yes   Education Details ther-ex   [employer]) Educated Patient   Methods Demonstration;Tactile cues;Verbal cues   Comprehension Returned demonstration;Verbal cues required;Tactile cues required           PT Long Term Goals - 04/29/15 1642    PT LONG TERM GOAL #1   Title Pt will be independent with her HEP to help decrease back pain and increase ability to perform standing tasks such as cooking.   Time 4  Period Weeks   Status New   PT LONG TERM GOAL #2   Title Patient will improve her LEFS score by at least 9 points as a demonstration of improved function.    Time 4   Period Weeks   Status New   PT LONG TERM GOAL #3   Title Patient will improve bilateral LE strength by 1/2 MMT grade to help decrease back and bilateral LE pain as well as to improve ability to perform standing tasks and to ambulate.   Time 4   Period Weeks   Status New           Plan - 05/01/15 1145    Clinical Impression Statement Increased R thigh symptoms and numbness with R trunk rotation. Decreased when pt stands. Decreased low back stiffness after session.    Pt will benefit from skilled therapeutic intervention in order to improve on the following deficits Difficulty walking;Decreased range of motion;Pain;Improper body  mechanics;Hypomobility;Decreased strength   Rehab Potential Good   Clinical Impairments Affecting Rehab Potential age   PT Frequency 2x / week   PT Duration 4 weeks   PT Treatment/Interventions Therapeutic exercise;Therapeutic activities;Manual techniques;Moist Heat;Electrical Stimulation;Cryotherapy;Patient/family education;Gait training;Neuromuscular re-education;Ultrasound;ADLs/Self Care Home Management   PT Next Visit Plan thoracic and lumbar mobility, hip strengthening, manual therapy   Consulted and Agree with Plan of Care Patient;Family member/caregiver   Family Member Consulted daugther    Problem List Patient Active Problem List   Diagnosis Date Noted  . Polyuria 04/01/2015  . Breast fibroadenoma 03/27/2015  . Bursitis of hip 03/27/2015  . Diabetes mellitus, type 2 03/27/2015  . Edema 03/27/2015  . HLD (hyperlipidemia) 03/27/2015  . Hypertension 03/27/2015  . Hypothyroid 03/27/2015  . Lactose intolerance 03/27/2015  . Lumbar disc disease 03/27/2015  . Nocturia 03/27/2015  . Osteoarthritis 03/27/2015  . Lumbar canal stenosis 03/27/2015  . Subcoracoid bursitis 03/27/2015  . B12 deficiency 03/27/2015    Joneen Boers PT, DPT  05/01/2015, 12:15 PM  [place] [facility] [address], [place], [address] Phone: [phone]   Fax:  [phone]

## 2015-05-01 NOTE — Patient Instructions (Signed)
Improved lumbar movement, scapular retraction, glute max activation after mod to max verbal, tactile, and mod visual cues.

## 2015-05-06 ENCOUNTER — Ambulatory Visit: Payer: Commercial Managed Care - HMO

## 2015-05-06 VITALS — BP 136/63 | HR 64

## 2015-05-06 DIAGNOSIS — R531 Weakness: Secondary | ICD-10-CM

## 2015-05-06 DIAGNOSIS — R262 Difficulty in walking, not elsewhere classified: Secondary | ICD-10-CM

## 2015-05-06 DIAGNOSIS — M545 Low back pain: Secondary | ICD-10-CM | POA: Diagnosis not present

## 2015-05-06 DIAGNOSIS — Z1231 Encounter for screening mammogram for malignant neoplasm of breast: Secondary | ICD-10-CM | POA: Diagnosis not present

## 2015-05-06 DIAGNOSIS — M519 Unspecified thoracic, thoracolumbar and lumbosacral intervertebral disc disorder: Secondary | ICD-10-CM

## 2015-05-06 NOTE — Patient Instructions (Addendum)
Improved exercise technique, movement at target joints, use of target muscles after mod verbal, visual, tactile cues.    Pt was recommended to try using a dyna disc on a chair only for when pt is sitting on a chair to promote low back comfort and mobility. Not for standing activities for safety.  Pt verbalized understanding.

## 2015-05-06 NOTE — Therapy (Signed)
[place] [facility] [address] [place], [address] Phone: [phone]   Fax:  [phone]  Physical Therapy Treatment  Patient Details  Name: [name] [name] MRN: [id number] Date of Birth: 10-Dec-1932 Referring Provider:  Birdie Sons, MD  Encounter Date: 05/06/2015      PT End of Session - 05/06/15 1425    Visit Number 3   Number of Visits 9   Date for PT Re-Evaluation 05/30/15   Authorization Type 3   Authorization Time Period of 10   PT Start Time 0086   PT Stop Time 1519   PT Time Calculation (min) 54 min   Activity Tolerance Patient tolerated treatment well   Behavior During Therapy [facility] for tasks assessed/performed      Past Medical History  Diagnosis Date  . Hyperlipidemia   . Hypertension   . Diabetes mellitus without complication   . Arthritis   . Allergy   . Thyroid disease     Past Surgical History  Procedure Laterality Date  . Abdominal hysterectomy    . Thyroidectomy, partial  1994  . Tonsillectomy      Filed Vitals:   05/06/15 1426  BP: 136/63  Pulse: 64    Visit Diagnosis:  Lumbar disc disease  Bilateral low back pain, with sciatica presence unspecified  Weakness  Difficulty walking      Subjective Assessment - 05/06/15 1426    Subjective Patient states feeling low back stiffness. Also had a busy morning today.    Patient Stated Goals "I would like to not use the cane and walk (secondary to LE weakness)." Try to work on balance.    Currently in Pain? Yes   Pain Score --  None provided. Back feels stiff.   Pain Descriptors / Indicators --  stiffness   Multiple Pain Sites Yes  low back, R and L LE            [facility] PT Assessment - 05/06/15 1435    Observation/Other Assessments   Observations Long sit test suggests posterior nutation R innominate.            [place] Adult PT Treatment/Exercise - 05/06/15 1448    Exercises   Other Exercises  Directed  patient with sit <> stand with emphasis on L LE use 10x, seated manually resisted L hip/glute extension 10x3, sitting on pillow: anterior/posterior pelvic tilts 10x3, sitting on physioball: side/side pelvic tilts 10x3 each direction, sitting bilateral scapular retraction 10x2 with 5 second holds. sitting on dyna disc bilateral scapular retraction resisting yellow band 10x3 (upgraded scap retract HEP to yellow band; pt demonstrated and verbalized understanding), standing hip abduction 10x2 each LE           PT Education - 05/06/15 2357    Education provided Yes   Education Details ther-ex   [employer]) Educated Patient   Methods Demonstration;Tactile cues;Verbal cues   Comprehension Returned demonstration;Verbal cues required;Tactile cues required             PT Long Term Goals - 04/29/15 1642    PT LONG TERM GOAL #1   Title Pt will be independent with her HEP to help decrease back pain and increase ability to perform standing tasks such as cooking.   Time 4   Period Weeks   Status New   PT LONG TERM GOAL #2   Title Patient will improve her LEFS score by at least 9 points as a demonstration of improved function.  Time 4   Period Weeks   Status New   PT LONG TERM GOAL #3   Title Patient will improve bilateral LE strength by 1/2 MMT grade to help decrease back and bilateral LE pain as well as to improve ability to perform standing tasks and to ambulate.   Time 4   Period Weeks   Status New               Plan - 05/06/15 1519    Clinical Impression Statement Pt states back and posterior hips do not feel as stiff after session.    Pt will benefit from skilled therapeutic intervention in order to improve on the following deficits Difficulty walking;Decreased range of motion;Pain;Improper body mechanics;Hypomobility;Decreased strength   Rehab Potential Good   Clinical Impairments Affecting Rehab Potential age   PT Frequency 2x / week   PT Duration 4 weeks   PT  Treatment/Interventions Therapeutic exercise;Therapeutic activities;Manual techniques;Moist Heat;Electrical Stimulation;Cryotherapy;Patient/family education;Gait training;Neuromuscular re-education;Ultrasound;ADLs/Self Care Home Management   PT Next Visit Plan thoracic and lumbar mobility, hip strengthening, manual therapy   Consulted and Agree with Plan of Care Patient;Family member/caregiver   Family Member Consulted husband        Problem List Patient Active Problem List   Diagnosis Date Noted  . Polyuria 04/01/2015  . Breast fibroadenoma 03/27/2015  . Bursitis of hip 03/27/2015  . Diabetes mellitus, type 2 03/27/2015  . Edema 03/27/2015  . HLD (hyperlipidemia) 03/27/2015  . Hypertension 03/27/2015  . Hypothyroid 03/27/2015  . Lactose intolerance 03/27/2015  . Lumbar disc disease 03/27/2015  . Nocturia 03/27/2015  . Osteoarthritis 03/27/2015  . Lumbar canal stenosis 03/27/2015  . Subcoracoid bursitis 03/27/2015  . B12 deficiency 03/27/2015    Joneen Boers PT, DPT   05/07/2015, 12:01 AM  [place] [facility] [address] [place], [address] Phone: [phone]   Fax:  [phone]

## 2015-05-08 ENCOUNTER — Other Ambulatory Visit: Payer: Self-pay | Admitting: *Deleted

## 2015-05-08 ENCOUNTER — Ambulatory Visit: Payer: Commercial Managed Care - HMO

## 2015-05-08 DIAGNOSIS — M545 Low back pain: Secondary | ICD-10-CM

## 2015-05-08 DIAGNOSIS — M519 Unspecified thoracic, thoracolumbar and lumbosacral intervertebral disc disorder: Secondary | ICD-10-CM

## 2015-05-08 DIAGNOSIS — R531 Weakness: Secondary | ICD-10-CM

## 2015-05-08 DIAGNOSIS — R262 Difficulty in walking, not elsewhere classified: Secondary | ICD-10-CM

## 2015-05-08 MED ORDER — FUROSEMIDE 20 MG PO TABS: 20.0000 mg | ORAL_TABLET | Freq: Every day | ORAL | Status: DC

## 2015-05-08 NOTE — Patient Instructions (Addendum)
Improved exercise technique, lumbopelvic mobility,  movement at target joints, use of target muscles after mod verbal, visual, tactile cues.    Pt was also recommended to perform supine posterior/anterior pelvic tilts when she wakes up in the morning to help decrease back stiffness. Pt verbalized understanding.

## 2015-05-08 NOTE — Telephone Encounter (Signed)
Patient requesting rx for furosemide be sent to [facility]. [facility] was filled [facility]/30/2015 [facility]

## 2015-05-08 NOTE — Therapy (Signed)
[facility] [address] [place], [address] Phone: [phone]   Fax:  [phone]  Physical Therapy Treatment  Patient Details  Name: [name] [name] MRN: [id number] Date of Birth: [birth year] Referring Provider:  Birdie Sons, MD  Encounter Date: 05/08/2015      PT End of Session - 05/08/15 1357    Visit Number 4   Number of Visits 9   Date for PT Re-Evaluation 05/30/15   Authorization Type 4   Authorization Time Period of 10   PT Start Time 1357  Pt arrived late   PT Stop Time 1434   PT Time Calculation (min) 37 min   Activity Tolerance Patient tolerated treatment well   Behavior During Therapy [facility] for tasks assessed/performed      Past Medical History  Diagnosis Date  . Hyperlipidemia   . Hypertension   . Diabetes mellitus without complication   . Arthritis   . Allergy   . Thyroid disease     Past Surgical History  Procedure Laterality Date  . Abdominal hysterectomy    . Thyroidectomy, partial  1994  . Tonsillectomy      There were no vitals filed for this visit.  Visit Diagnosis:  Lumbar disc disease  Bilateral low back pain, with sciatica presence unspecified  Weakness  Difficulty walking      Subjective Assessment - 05/08/15 1413    Subjective Pt states that her daughter said that she is walking better. Pt also states that her back feels better today than yesterday. Back usually feels better after physical therapy.    Patient Stated Goals "I would like to not use the cane and walk (secondary to LE weakness)." Try to work on balance.    Currently in Pain? Yes   Pain Score 9   stiffness   Multiple Pain Sites Yes           [facility] Adult PT Treatment/Exercise - 05/08/15 1419    Exercises   Other Exercises  Directed patient with sitting on physioball: anterior/posterior pelvic tilts 10x3, side /side pelvic tilts 10x3; sitting on dyna disc bilateral scapular retraction  yellow band 10x3 with 5 second holds, seated manually resisted L hip extension 10x3, seated R hip flexion isometrics 10x5 seconds for 3 sets, sit <> stand from mat table 10x.           PT Education - 05/08/15 2319    Education provided Yes   Education Details ther-ex   Person(s) Educated Patient   Methods Explanation;Demonstration;Tactile cues;Verbal cues   Comprehension Returned demonstration;Verbal cues required;Tactile cues required             PT Long Term Goals - 04/29/15 1642    PT LONG TERM GOAL #1   Title Pt will be independent with her HEP to help decrease back pain and increase ability to perform standing tasks such as cooking.   Time 4   Period Weeks   Status New   PT LONG TERM GOAL #2   Title Patient will improve her LEFS score by at least 9 points as a demonstration of improved function.    Time 4   Period Weeks   Status New   PT LONG TERM GOAL #3   Title Patient will improve bilateral LE strength by 1/2 MMT grade to help decrease back and bilateral LE pain as well as to improve ability to perform standing tasks and to ambulate.   Time 4  Period Weeks   Status New           Plan - 05/08/15 1434    Clinical Impression Statement Pt states feeling better after session. Improved lumbopelvic mobility after repetitions with physioball exercise.    Pt will benefit from skilled therapeutic intervention in order to improve on the following deficits Difficulty walking;Decreased range of motion;Pain;Improper body mechanics;Hypomobility;Decreased strength   Rehab Potential Good   Clinical Impairments Affecting Rehab Potential age   PT Frequency 2x / week   PT Duration 4 weeks   PT Treatment/Interventions Therapeutic exercise;Therapeutic activities;Manual techniques;Moist Heat;Electrical Stimulation;Cryotherapy;Patient/family education;Gait training;Neuromuscular re-education;Ultrasound;ADLs/Self Care Home Management   PT Next Visit Plan thoracic and lumbar  mobility, hip strengthening, manual therapy   Consulted and Agree with Plan of Care Patient        Problem List Patient Active Problem List   Diagnosis Date Noted  . Polyuria 04/01/2015  . Breast fibroadenoma 03/27/2015  . Bursitis of hip 03/27/2015  . Diabetes mellitus, type 2 03/27/2015  . Edema 03/27/2015  . HLD (hyperlipidemia) 03/27/2015  . Hypertension 03/27/2015  . Hypothyroid 03/27/2015  . Lactose intolerance 03/27/2015  . Lumbar disc disease 03/27/2015  . Nocturia 03/27/2015  . Osteoarthritis 03/27/2015  . Lumbar canal stenosis 03/27/2015  . Subcoracoid bursitis 03/27/2015  . B12 deficiency 03/27/2015    Joneen Boers PT, DPT  05/08/2015, 11:25 PM  [facility] [address], [place], [address] Phone: [phone]   Fax:  [phone]

## 2015-05-15 ENCOUNTER — Ambulatory Visit: Payer: Commercial Managed Care - HMO | Admitting: Physical Therapy

## 2015-05-15 ENCOUNTER — Encounter: Payer: Self-pay | Admitting: Family Medicine

## 2015-05-15 DIAGNOSIS — M545 Low back pain: Secondary | ICD-10-CM | POA: Diagnosis not present

## 2015-05-15 DIAGNOSIS — R531 Weakness: Secondary | ICD-10-CM | POA: Diagnosis not present

## 2015-05-15 DIAGNOSIS — R262 Difficulty in walking, not elsewhere classified: Secondary | ICD-10-CM | POA: Diagnosis not present

## 2015-05-15 DIAGNOSIS — M519 Unspecified thoracic, thoracolumbar and lumbosacral intervertebral disc disorder: Secondary | ICD-10-CM

## 2015-05-15 NOTE — Patient Instructions (Addendum)
   All exercises provided were adapted from [email or web address]. Patient was provided a written handout with pictures as described. Any additional cues were manually entered in to handout and copied in to this document.  ELASTIC BAND - KNEE EXTENSION  While seated and an elastic band attched to your ankle, straighten your knee and draw your foot upwards.   STANDING HEEL RAISES  While standing, raise up on your toes as you lift your heels off the ground.    SIT TO STAND  Start by sitting in a chair. Next, raise up to standing without using your hands for support.    ** Use your hands!*

## 2015-05-15 NOTE — Therapy (Signed)
[facility] [address] [place], [address] Phone: [phone]   Fax:  [phone]  Physical Therapy Treatment  Patient Details  Name: [name] [name] MRN: [id number] Date of Birth: 11/22/1932 Referring Provider:  Birdie Sons, MD  Encounter Date: 05/15/2015      PT End of Session - 05/15/15 1358    Visit Number 5   Number of Visits 9   Date for PT Re-Evaluation 05/30/15   Authorization Type 5   Authorization Time Period of 10   PT Start Time 1300   PT Stop Time 1345   PT Time Calculation (min) 45 min   Equipment Utilized During Treatment Gait belt  SPC   Activity Tolerance Patient tolerated treatment well   Behavior During Therapy [facility] for tasks assessed/performed      Past Medical History  Diagnosis Date  . Hyperlipidemia   . Hypertension   . Diabetes mellitus without complication   . Arthritis   . Allergy   . Thyroid disease     Past Surgical History  Procedure Laterality Date  . Abdominal hysterectomy    . Thyroidectomy, partial  1994  . Tonsillectomy      There were no vitals filed for this visit.  Visit Diagnosis:  Lumbar disc disease  Weakness  Difficulty walking      Subjective Assessment - 05/15/15 1307    Subjective Patient states this has been a bad week for her, that she has been to several funerals and not been exercising as much as she normally has. She feels stiff everywhere, primarily her legs and back.    Pertinent History Pt came to physical therapy secondary to low back pain. L LE symptoms (coldness) started bothering her first (over 5 years ago). Got cortisone shots x2 which helped. Started having symptoms on her right side as well. Currently walks with  a cane and a rw for balance. Difficulty sleeping on her R. Has to sleep on her back now with pillows under her knees. Feels weakness with her R LE. Has to step up with her L LE when negotiating stairs due to R  LE weakness. Pt also adds that an office chair rolled out from underneath her 5 years ago which might have increased her L LE "coldness" symptoms. Had X-rays and MRIs with which pt was diagnosed with spinal stenosis.  Uses a SPC for balance.  Pt daughter states that her mom is trying to maintain the ROM she has at her age.  Denies saddle anesthesia symptoms. Wears depends diapers for incontinence.    Patient Stated Goals "I would like to not use the cane and walk (secondary to LE weakness)." Try to work on balance.       Standing heel raises x 15 for 2 sets  Standing hip flexion marching with HHA 2 sets x 15  Standing pelvic nutation/counter-nutation x 12  Sit to stand x 5 for 2 sets with cuing for anterior movement of shoulders to initiate more hip flexion    Seated scapular retraction (mid rows) with yellow t-band x 15   OMEGA Leg extensions x 5# with bilateral LEs 2x10  Step ups (to exercise step) forwards x 8 for 3 sets on RLE and HHA, no increase in pain   Step ups x 4 for 2 sets with RLE ascending first, no increased pain.   Manual and self seated piriformis stretching in figure 4 position, decreased pulling sensation in her R  hip. X 1 minute each                            PT Education - 05/15/15 1357    Education provided Yes   Education Details Educated patient extensively on the need for her to exercise daily and complete strengthening routine as her LBP seems to be related to LE weakness.    Person(s) Educated Patient;Spouse   Methods Explanation;Demonstration;Verbal cues;Handout   Comprehension Verbalized understanding;Returned demonstration;Verbal cues required;Need further instruction             PT Long Term Goals - 04/29/15 1642    PT LONG TERM GOAL #1   Title Pt will be independent with her HEP to help decrease back pain and increase ability to perform standing tasks such as cooking.   Time 4   Period Weeks   Status New   PT LONG TERM  GOAL #2   Title Patient will improve her LEFS score by at least 9 points as a demonstration of improved function.    Time 4   Period Weeks   Status New   PT LONG TERM GOAL #3   Title Patient will improve bilateral LE strength by 1/2 MMT grade to help decrease back and bilateral LE pain as well as to improve ability to perform standing tasks and to ambulate.   Time 4   Period Weeks   Status New               Plan - 05/15/15 1359    Clinical Impression Statement Patient and spouse report improved gait after session and patient reports no symptoms afterwards. She states that her LEs feel lighter than prior to the session after the strengthening exercises provided. Patient displays significant bilateral LE weakness likely being compensated for by rigidity of her lumbar spine and paraspinals, and would benefit from LE strengthening.    Pt will benefit from skilled therapeutic intervention in order to improve on the following deficits Difficulty walking;Decreased range of motion;Pain;Improper body mechanics;Hypomobility;Decreased strength   Rehab Potential Good   Clinical Impairments Affecting Rehab Potential age   PT Frequency 2x / week   PT Duration 4 weeks   PT Treatment/Interventions Therapeutic exercise;Therapeutic activities;Manual techniques;Moist Heat;Electrical Stimulation;Cryotherapy;Patient/family education;Gait training;Neuromuscular re-education;Ultrasound;ADLs/Self Care Home Management   PT Next Visit Plan LE strengthening   Consulted and Agree with Plan of Care Patient   Family Member Consulted husband        Problem List Patient Active Problem List   Diagnosis Date Noted  . Polyuria 04/01/2015  . Breast fibroadenoma 03/27/2015  . Bursitis of hip 03/27/2015  . Diabetes mellitus, type 2 03/27/2015  . Edema 03/27/2015  . HLD (hyperlipidemia) 03/27/2015  . Hypertension 03/27/2015  . Hypothyroid 03/27/2015  . Lactose intolerance 03/27/2015  . Lumbar disc disease  03/27/2015  . Nocturia 03/27/2015  . Osteoarthritis 03/27/2015  . Lumbar canal stenosis 03/27/2015  . Subcoracoid bursitis 03/27/2015  . B12 deficiency 03/27/2015    Kerman Passey, PT, DPT    05/15/2015, 2:02 PM  [facility] [address] [place], [address] Phone: [phone]   Fax:  [phone]

## 2015-05-21 ENCOUNTER — Ambulatory Visit: Payer: Commercial Managed Care - HMO | Attending: Family Medicine

## 2015-05-21 DIAGNOSIS — R262 Difficulty in walking, not elsewhere classified: Secondary | ICD-10-CM | POA: Diagnosis not present

## 2015-05-21 DIAGNOSIS — M519 Unspecified thoracic, thoracolumbar and lumbosacral intervertebral disc disorder: Secondary | ICD-10-CM | POA: Insufficient documentation

## 2015-05-21 DIAGNOSIS — M545 Low back pain: Secondary | ICD-10-CM | POA: Diagnosis not present

## 2015-05-21 DIAGNOSIS — R531 Weakness: Secondary | ICD-10-CM | POA: Insufficient documentation

## 2015-05-21 NOTE — Therapy (Signed)
[facility] [address], [place], [address] Phone: [phone]   Fax:  [phone]  Physical Therapy Treatment  Patient Details  Name: [name] [name] MRN: [id number] Date of Birth: Sep 28, 1932 Referring Provider:  Birdie Sons, MD  Encounter Date: 05/21/2015      PT End of Session - 05/21/15 1048    Visit Number 6   Number of Visits 9   Date for PT Re-Evaluation 05/30/15   Authorization Type 6   Authorization Time Period of 10   PT Start Time 1048   PT Stop Time 1133   PT Time Calculation (min) 45 min   Activity Tolerance Patient tolerated treatment well   Behavior During Therapy [facility] for tasks assessed/performed      Past Medical History  Diagnosis Date  . Hyperlipidemia   . Hypertension   . Diabetes mellitus without complication   . Arthritis   . Allergy   . Thyroid disease     Past Surgical History  Procedure Laterality Date  . Abdominal hysterectomy    . Thyroidectomy, partial  1994  . Tonsillectomy      There were no vitals filed for this visit.  Visit Diagnosis:  Lumbar disc disease  Weakness  Difficulty walking  Bilateral low back pain, with sciatica presence unspecified      Subjective Assessment - 05/21/15 1102    Subjective Patient states that she feels stiff. Pt also states having a shot in her back before which helped her LE symptoms temporarily.    Patient Stated Goals "I would like to not use the cane and walk (secondary to LE weakness)." Try to work on balance.    Currently in Pain? No/denies   Multiple Pain Sites No          [facility] Adult PT Treatment/Exercise - 05/21/15 2033    Exercises   Other Exercises  Directed patient with sitting on dyna disc anterior posterior pelvic tilts 15x2 each direction, side/side pelvic tilts 15x2 each direction, seated bilateral scapular retraction red band 10x3 with 5 second holds. Per pt request on information pertaining to  LE numbness/paresthesia, (increased time spent): pt education on low back, piriformis, glute max weakness, increased hamstring use invovlvment in LE paresthesia as a possible cause in addition to possible diabetic neuropathy. Visual aids such as pictures of dermatomes, sciatic nerve distribution, and spinal model utilized.            PT Education - 05/21/15 2040    Education provided Yes   Education Details low back, glute max weakness, increased hamstring and piriformis use involvement on LE paresthesia; ther-ex   Person(s) Educated Patient   Methods Explanation;Demonstration;Tactile cues;Verbal cues;Other (comment)  visual aids   Comprehension Verbalized understanding;Returned demonstration;Verbal cues required;Tactile cues required             PT Long Term Goals - 04/29/15 1642    PT LONG TERM GOAL #1   Title Pt will be independent with her HEP to help decrease back pain and increase ability to perform standing tasks such as cooking.   Time 4   Period Weeks   Status New   PT LONG TERM GOAL #2   Title Patient will improve her LEFS score by at least 9 points as a demonstration of improved function.    Time 4   Period Weeks   Status New   PT LONG TERM GOAL #3   Title Patient will improve bilateral LE strength  by 1/2 MMT grade to help decrease back and bilateral LE pain as well as to improve ability to perform standing tasks and to ambulate.   Time 4   Period Weeks   Status New               Plan - 05/21/15 2041    Clinical Impression Statement Decreased lumbar stiffness with exercises.    Pt will benefit from skilled therapeutic intervention in order to improve on the following deficits Difficulty walking;Decreased range of motion;Pain;Improper body mechanics;Hypomobility;Decreased strength   Rehab Potential Good   Clinical Impairments Affecting Rehab Potential age   PT Frequency 2x / week   PT Duration 4 weeks   PT Treatment/Interventions Therapeutic  exercise;Therapeutic activities;Manual techniques;Moist Heat;Electrical Stimulation;Cryotherapy;Patient/family education;Gait training;Neuromuscular re-education;Ultrasound;ADLs/Self Care Home Management   PT Next Visit Plan LE strengthening, lumbar mobility   Consulted and Agree with Plan of Care Patient   Family Member Consulted husband        Problem List Patient Active Problem List   Diagnosis Date Noted  . Polyuria 04/01/2015  . Breast fibroadenoma 03/27/2015  . Bursitis of hip 03/27/2015  . Diabetes mellitus, type 2 03/27/2015  . Edema 03/27/2015  . HLD (hyperlipidemia) 03/27/2015  . Hypertension 03/27/2015  . Hypothyroid 03/27/2015  . Lactose intolerance 03/27/2015  . Lumbar disc disease 03/27/2015  . Nocturia 03/27/2015  . Osteoarthritis 03/27/2015  . Lumbar canal stenosis 03/27/2015  . Subcoracoid bursitis 03/27/2015  . B12 deficiency 03/27/2015    Joneen Boers PT, DPT   05/21/2015, 8:46 PM  [facility] [address] [place], [address] Phone: [phone]   Fax:  [phone]

## 2015-05-21 NOTE — Patient Instructions (Signed)
Improved exercise technique, movement at target joints, use of target muscles after mod verbal, visual, tactile cues.   

## 2015-05-23 ENCOUNTER — Ambulatory Visit: Payer: Commercial Managed Care - HMO

## 2015-05-23 DIAGNOSIS — R531 Weakness: Secondary | ICD-10-CM | POA: Diagnosis not present

## 2015-05-23 DIAGNOSIS — M519 Unspecified thoracic, thoracolumbar and lumbosacral intervertebral disc disorder: Secondary | ICD-10-CM

## 2015-05-23 DIAGNOSIS — R262 Difficulty in walking, not elsewhere classified: Secondary | ICD-10-CM

## 2015-05-23 DIAGNOSIS — M545 Low back pain: Secondary | ICD-10-CM

## 2015-05-23 NOTE — Therapy (Signed)
[facility] [address] [place], [address] Phone: [phone]   Fax:  [phone]  Physical Therapy Evaluation  Patient Details  Name: [name] [name] MRN: [id number] Date of Birth: May 21, 1933 Referring Provider:  Birdie Sons, MD  Encounter Date: 05/23/2015      PT End of Session - 05/23/15 1040    Visit Number 7   Number of Visits 9   Date for PT Re-Evaluation 05/30/15   Authorization Type 7   Authorization Time Period of 10   PT Start Time 1038   PT Stop Time 1125   PT Time Calculation (min) 47 min   Equipment Utilized During Treatment Gait belt   Activity Tolerance Patient tolerated treatment well   Behavior During Therapy [facility] for tasks assessed/performed      Past Medical History  Diagnosis Date  . Hyperlipidemia   . Hypertension   . Diabetes mellitus without complication   . Arthritis   . Allergy   . Thyroid disease     Past Surgical History  Procedure Laterality Date  . Abdominal hysterectomy    . Thyroidectomy, partial  1994  . Tonsillectomy      There were no vitals filed for this visit.  Visit Diagnosis:  Lumbar disc disease  Weakness  Difficulty walking  Bilateral low back pain, with sciatica presence unspecified      Subjective Assessment - 05/23/15 1040    Subjective Back is not as stiff. I think at times I feel like I'm walking better.    Patient Stated Goals "I would like to not use the cane and walk (secondary to LE weakness)." Try to work on balance.    Currently in Pain? No/denies   Multiple Pain Sites No            [facility] PT Assessment - 05/23/15 1054    Observation/Other Assessments   Modified Oswertry 44% (severe disability)   Strength   Right Hip Flexion 4/5   Right Hip ABduction 4+/5  sitting with hips < 90 degrees abduction   Left Hip Flexion 4-/5   Left Hip ABduction 4+/5  sitting, hips < 90 degrees flexion   Right Knee Flexion 4/5    Right Knee Extension 5/5   Left Knee Flexion 4/5   Left Knee Extension 5/5   Right Ankle Dorsiflexion 4+/5   Right Ankle Plantar Flexion 4+/5   Left Ankle Dorsiflexion 4+/5   Left Ankle Plantar Flexion 4+/5           [facility] Adult PT Treatment/Exercise - 05/23/15 1048    Exercises   Other Exercises  Directed patient with supine bilateral shoulder flexion 10x3 to promote upper thoracic extension, supine lower trunk rotation 10x3 each side (to promote lumbar mobility), manually resisted hip flexion, hip abduction (seated clam shells), knee flexion, knee extension, ankle DF, ankle PF 1x each way for each LE. Reviewed progress/current status with LE strength with pt. Manual ankle PF and IV each LE to promote nerve mobility, standing mini squats 10x2, side step (CGA) 32ft 2x5 each direction, standing bilateral scapular retraction 10x2 resisting yellow band.             PT Education - 05/23/15 2120    Education provided Yes   Education Details ther-ex   [employer]) Educated Patient;Child(ren)  daughter present   Methods Explanation;Demonstration;Tactile cues;Verbal cues   Comprehension Verbalized understanding;Returned demonstration             PT  Long Term Goals - 05/23/15 2125    PT LONG TERM GOAL #1   Title Pt will be independent with her HEP to help decrease back pain and increase ability to perform standing tasks such as cooking.   Time 4   Period Weeks   Status Achieved   PT LONG TERM GOAL #2   Title Patient will improve her LEFS (Modified Oswestry Low Back Pain Disability Questionnaire) score by at least 9 points as a demonstration of improved function.    Time 4   Period Weeks   Status Achieved   PT LONG TERM GOAL #3   Title Patient will improve bilateral LE strength by 1/2 MMT grade to help decrease back and bilateral LE pain as well as to improve ability to perform standing tasks and to ambulate.   Time 4   Period Weeks   Status On-going                Plan - 05/23/15 1129    Clinical Impression Statement Pt states that she has a busy month coming up and wants to try her HEP and schedule another follow up session when things calm down. Pt states she usually feels better after therapy. No complain of pain throughout session. Mainly bilateral foot numbness.  Patient has also demonstrated improved bilateral glute med, L hamstring, bilateral gastroc muscle strength as well as improved Modified Oswestry Low Back Pain Disability questionnaire score to 44% suggesting improved function since initial evaluation. Patient making good progress towards goals.    Pt will benefit from skilled therapeutic intervention in order to improve on the following deficits Difficulty walking;Decreased range of motion;Pain;Improper body mechanics;Hypomobility;Decreased strength   Rehab Potential Good   Clinical Impairments Affecting Rehab Potential age   PT Frequency 2x / week   PT Duration 4 weeks   PT Treatment/Interventions Therapeutic exercise;Therapeutic activities;Manual techniques;Moist Heat;Electrical Stimulation;Cryotherapy;Patient/family education;Gait training;Neuromuscular re-education;Ultrasound;ADLs/Self Care Home Management   PT Next Visit Plan Pt to perform HEP and to schedule more PT visits when her schedule allows.    Consulted and Agree with Plan of Care Patient;Family member/caregiver  daugther    Family Member Consulted daughter         Problem List Patient Active Problem List   Diagnosis Date Noted  . Polyuria 04/01/2015  . Breast fibroadenoma 03/27/2015  . Bursitis of hip 03/27/2015  . Diabetes mellitus, type 2 03/27/2015  . Edema 03/27/2015  . HLD (hyperlipidemia) 03/27/2015  . Hypertension 03/27/2015  . Hypothyroid 03/27/2015  . Lactose intolerance 03/27/2015  . Lumbar disc disease 03/27/2015  . Nocturia 03/27/2015  . Osteoarthritis 03/27/2015  . Lumbar canal stenosis 03/27/2015  . Subcoracoid bursitis 03/27/2015  . B12  deficiency 03/27/2015    Joneen Boers PT, DPT   05/23/2015, 9:38 PM  [facility] [address], [place], [address] Phone: [phone]   Fax:  [phone]

## 2015-05-23 NOTE — Patient Instructions (Addendum)
Improved exercise technique, movement at target joints, use of target muscles after mod verbal, visual, tactile cues.   Pt was recommended to perform seated on dyna disc on chair with arms anterior/posterior and side/side pelvic tilts as part of her HEP. Pt and daughter verbalized understanding.

## 2015-05-27 ENCOUNTER — Ambulatory Visit (INDEPENDENT_AMBULATORY_CARE_PROVIDER_SITE_OTHER): Payer: Commercial Managed Care - HMO | Admitting: Family Medicine

## 2015-05-27 ENCOUNTER — Encounter: Payer: Self-pay | Admitting: Family Medicine

## 2015-05-27 VITALS — BP 130/70 | HR 70 | Temp 99.1°F | Resp 16 | Wt 181.8 lb

## 2015-05-27 DIAGNOSIS — R358 Other polyuria: Secondary | ICD-10-CM | POA: Diagnosis not present

## 2015-05-27 DIAGNOSIS — Z23 Encounter for immunization: Secondary | ICD-10-CM

## 2015-05-27 DIAGNOSIS — I10 Essential (primary) hypertension: Secondary | ICD-10-CM | POA: Diagnosis not present

## 2015-05-27 DIAGNOSIS — R3589 Other polyuria: Secondary | ICD-10-CM

## 2015-05-27 MED ORDER — OXYBUTYNIN CHLORIDE 5 MG PO TABS: ORAL_TABLET | ORAL | Status: DC

## 2015-05-27 NOTE — Progress Notes (Signed)
Patient: [name] [name] Female    DOB: 1932-11-28   81 y.o.   MRN: [id number] Visit Date: 05/27/2015  Today's Provider: Lelon Huh, MD   Chief Complaint  Patient presents with  . Follow-up    Polyuria   Subjective:    HPI  Polyuria Follow up: Last office visit was 2 months ago.  Management at that visit includes starting Vesicare 5mg  daily (samples given). Since this visit patient has tried taking the Vesicare but stopped after about a week due to it causing itching and breaking out. She states urinary frequency had improved the last few days she took medication. Today patient is still having frequency with urination and it is unchanged since last visit.  Patient also states she finished her physical therapist last week. Patient wants to take a break. She is getting up and around a bit more now. .     Allergies  Allergen Reactions  . Lovastatin     Weakness  . Pravastatin Sodium     Muscle weakness  . Simvastatin     Fatigue   Previous Medications   AMLODIPINE (NORVASC) 5 MG TABLET    Take 1 tablet by mouth daily.   ASPIRIN 81 MG TABLET    Take 1 tablet by mouth daily.   CHOLECALCIFEROL (VITAMIN D) 1000 UNITS TABLET    Take 1 tablet by mouth daily.   EZETIMIBE (ZETIA) 10 MG TABLET    Take 10 mg by mouth daily.   FUROSEMIDE (LASIX) 20 MG TABLET    Take 1 tablet (20 mg total) by mouth daily.   GLIPIZIDE (GLUCOTROL XL) 10 MG 24 HR TABLET    Take 1 tablet by mouth daily.   HYDROCODONE-ACETAMINOPHEN (NORCO/VICODIN) 5-325 MG PER TABLET    Take by mouth.   LEVOTHYROXINE (SYNTHROID, LEVOTHROID) 75 MCG TABLET    Take 1 tablet by mouth daily.   METFORMIN (GLUCOPHAGE-XR) 750 MG 24 HR TABLET    Take 1 tablet by mouth 2 (two) times daily.   OMEGA-3 FATTY ACIDS (FISH OIL) 1000 MG CAPS    Take 1 capsule by mouth daily.   QUINAPRIL (ACCUPRIL) 40 MG TABLET    Take 2 tablets by mouth daily.   VITAMIN B-12 (CYANOCOBALAMIN) 100 MCG TABLET    Take 1 tablet by mouth daily.    Review  of Systems  Constitutional: Negative.   HENT: Negative.   Eyes: Negative.   Respiratory: Negative.   Cardiovascular: Negative.  Negative for chest pain and palpitations.  Gastrointestinal: Negative.   Endocrine: Negative.   Genitourinary: Positive for urgency and frequency.  Musculoskeletal: Positive for back pain (finished her physical therapy last week).  Skin: Negative.   Allergic/Immunologic: Negative.   Neurological: Negative.   Hematological: Negative.   Psychiatric/Behavioral: Negative.     Social History  Substance Use Topics  . Smoking status: Never Smoker   . Smokeless tobacco: Not on file  . Alcohol Use: No   Objective:   BP 130/70 mmHg  Pulse 70  Temp(Src) 99.1 F (37.3 C) (Oral)  Resp 16  Wt 181 lb 12.8 oz (82.464 kg)  Physical Exam  General Appearance:    Alert, cooperative, no distress  Eyes:    PERRL, conjunctiva/corneas clear, EOM's intact       Lungs:     Clear to auscultation bilaterally, respirations unlabored  Heart:    Regular rate and rhythm  Neurologic:   Awake, alert, oriented x 3. No apparent focal neurological  defect.   Ext:  1+ pitting edema right ankle, 2+ pitting edema left ankle.         Assessment & Plan:     1. Polyuria Did improve on vesicare, but had mild allergic reaction.  - oxybutynin (DITROPAN) 5 MG tablet; 1 tablet 2-3 times daily as needed for urinary frequency  Dispense: 90 tablet; Refill: 1  2. Essential hypertension well controlled Continue current medications.    3. Need for influenza vaccination  - Flu vaccine HIGH DOSE PF        Lelon Huh, MD  [facility]

## 2015-05-28 ENCOUNTER — Telehealth: Payer: Self-pay | Admitting: Family Medicine

## 2015-05-28 DIAGNOSIS — R358 Other polyuria: Secondary | ICD-10-CM

## 2015-05-28 DIAGNOSIS — R3589 Other polyuria: Secondary | ICD-10-CM

## 2015-05-28 MED ORDER — OXYBUTYNIN CHLORIDE 5 MG PO TABS: ORAL_TABLET | ORAL | Status: DC

## 2015-05-28 NOTE — Telephone Encounter (Signed)
Patient stated that she still wants to send rx to [facility], no matter that it is only a 30 day supply.

## 2015-05-28 NOTE — Telephone Encounter (Signed)
Pt called because the oxybutynin (DITROPAN) 5 MG tablet  Was supposed to have been sent to [facility] for a 30 day supply and a 90 day supply sent to [facility]. Pt stated that she went to [facility] to get the medication and they were going to charge her 17$ for a 90 day supply so she left the medication at [facility]. Pt request that this get fixed. Thanks TNP

## 2015-05-28 NOTE — Telephone Encounter (Signed)
This medication is to be taken 2-3 times a day, a prescription for 90 tablets is a 30 day supplies, which was what was sent to [facility]. She needs to try the medication for a couple of weeks to make sure she tolerates it, and will send prescription to her mail order if it works for her.

## 2015-05-28 NOTE — Telephone Encounter (Signed)
Please advise? Patient is requesting 90 day supply of oxybutynin 5 mg be resent to [facility] and a 30 day supply be sent [facility] [address]?

## 2015-07-29 ENCOUNTER — Ambulatory Visit: Payer: Commercial Managed Care - HMO | Admitting: Family Medicine

## 2015-07-29 ENCOUNTER — Other Ambulatory Visit: Payer: Self-pay | Admitting: *Deleted

## 2015-07-29 MED ORDER — FUROSEMIDE 20 MG PO TABS: 20.0000 mg | ORAL_TABLET | Freq: Every day | ORAL | Status: DC

## 2015-08-02 DIAGNOSIS — E119 Type 2 diabetes mellitus without complications: Secondary | ICD-10-CM | POA: Diagnosis not present

## 2015-08-13 ENCOUNTER — Encounter: Payer: Self-pay | Admitting: Family Medicine

## 2015-08-13 ENCOUNTER — Ambulatory Visit (INDEPENDENT_AMBULATORY_CARE_PROVIDER_SITE_OTHER): Payer: Commercial Managed Care - HMO | Admitting: Family Medicine

## 2015-08-13 VITALS — BP 130/66 | HR 72 | Temp 98.6°F | Resp 16 | Wt 178.0 lb

## 2015-08-13 DIAGNOSIS — I10 Essential (primary) hypertension: Secondary | ICD-10-CM

## 2015-08-13 DIAGNOSIS — E119 Type 2 diabetes mellitus without complications: Secondary | ICD-10-CM

## 2015-08-13 LAB — POCT GLYCOSYLATED HEMOGLOBIN (HGB A1C)
Est. average glucose Bld gHb Est-mCnc: 140
HEMOGLOBIN A1C: 6.5

## 2015-08-13 LAB — POCT UA - MICROALBUMIN: MICROALBUMIN (UR) POC: 20 mg/L

## 2015-08-13 NOTE — Progress Notes (Signed)
Patient: [name] [name] Female    DOB: August 25, 1933   81 y.o.   MRN: [id number] Visit Date: 08/13/2015  Today's Provider: Lelon Huh, MD   Chief Complaint  Patient presents with  . Hypertension    follow up  . Hypothyroidism    follow up  . Diabetes    follow up  . Polyuria    follow up   Subjective:    HPI   Diabetes Mellitus Type II, Follow-up:   Lab Results  Component Value Date   HGBA1C 6.7* 04/01/2015   HGBA1C 6.4* 12/17/2014    Last seen for diabetes 4 months ago.  Management since then includes no changes. She reports good compliance with treatment. She is not having side effects.  Current symptoms include none and have been stable. Home blood sugar records: fasting range: below 100  Episodes of hypoglycemia? yes - in the 70's rarely   Current Insulin Regimen: none Most Recent Eye Exam: >1 year Weight trend: stable Prior visit with dietician: no Current diet: in general, a "healthy" diet   Current exercise: none  Pertinent Labs:    Component Value Date/Time   CHOL 202* 04/01/2015 0955   CHOL 219* 12/17/2014   TRIG 105 04/01/2015 0955   CHOLHDL 4.9* 04/01/2015 0955    Wt Readings from Last 3 Encounters:  05/27/15 181 lb 12.8 oz (82.464 kg)  04/01/15 179 lb (81.194 kg)  12/17/14 177 lb (80.287 kg)    ------------------------------------------------------------------------   Hypertension, follow-up:  BP Readings from Last 3 Encounters:  05/27/15 130/70  05/06/15 136/63  04/01/15 144/74    She was last seen for hypertension 4 months ago.  BP at that visit was 130/70. Management since that visit includes no changes. She reports good compliance with treatment. She is not having side effects.  She is not exercising. She is adherent to low salt diet.   Outside blood pressures are not being checked. She is experiencing lower extremity edema.  Cardiovascular risk factors include advanced age (older than 98 for men, 50 for  women), diabetes mellitus, dyslipidemia, hypertension and sedentary lifestyle.  Use of agents associated with hypertension: NSAIDS.     Weight trend: stable Wt Readings from Last 3 Encounters:  05/27/15 181 lb 12.8 oz (82.464 kg)  04/01/15 179 lb (81.194 kg)  12/17/14 177 lb (80.287 kg)    Current diet: in general, a "healthy" diet    ------------------------------------------------------------------------  Follow up Polyuria: Last office visit was 2 months ago. Changes made include discontinuing Vesicare and starting Oxybutynin 5mg . As needed for urinary frequency. Patient reports good compliance with treatment, good tolerance and fair symptom control. Since starting the Oxybutynin patient has seen improvement.   Follow up Hypothyroidism: Last office visit was 4 months ago and no changes were made. Patient reports good compliance with treatment and good tolerance.     Allergies  Allergen Reactions  . Lovastatin     Weakness  . Pravastatin Sodium     Muscle weakness  . Simvastatin     Fatigue   Previous Medications   AMLODIPINE (NORVASC) 5 MG TABLET    Take 1 tablet by mouth daily.   ASPIRIN 81 MG TABLET    Take 1 tablet by mouth daily.   CHOLECALCIFEROL (VITAMIN D) 1000 UNITS TABLET    Take 1 tablet by mouth daily.   FUROSEMIDE (LASIX) 20 MG TABLET    Take 1 tablet (20 mg total) by mouth daily.   GLIPIZIDE (GLUCOTROL  XL) 10 MG 24 HR TABLET    Take 1 tablet by mouth daily.   HYDROCODONE-ACETAMINOPHEN (NORCO/VICODIN) 5-325 MG PER TABLET    Take by mouth.   LEVOTHYROXINE (SYNTHROID, LEVOTHROID) 75 MCG TABLET    Take 1 tablet by mouth daily.   METFORMIN (GLUCOPHAGE-XR) 750 MG 24 HR TABLET    Take 1 tablet by mouth 2 (two) times daily.   OMEGA-3 FATTY ACIDS (FISH OIL) 1000 MG CAPS    Take 1 capsule by mouth daily.   OXYBUTYNIN (DITROPAN) 5 MG TABLET    1 tablet 2-3 times daily as needed for urinary frequency   QUINAPRIL (ACCUPRIL) 40 MG TABLET    Take 2 tablets by mouth daily.     VITAMIN B-12 (CYANOCOBALAMIN) 100 MCG TABLET    Take 1 tablet by mouth daily.    Review of Systems  Constitutional: Positive for appetite change (decrease in appetite). Negative for fever, chills and fatigue.  Respiratory: Negative for chest tightness and shortness of breath.   Cardiovascular: Negative for chest pain and palpitations.  Gastrointestinal: Positive for constipation. Negative for nausea, vomiting and abdominal pain.  Musculoskeletal: Positive for joint swelling (in ankles; improved since last visit).  Neurological: Positive for headaches. Negative for dizziness and weakness.    Social History  Substance Use Topics  . Smoking status: Never Smoker   . Smokeless tobacco: Not on file  . Alcohol Use: No   Objective:   BP 130/66 mmHg  Pulse 72  Temp(Src) 98.6 F (37 C) (Oral)  Resp 16  Wt 178 lb (80.74 kg)  SpO2 96%  Physical Exam  General appearance: alert, well developed, well nourished, cooperative and in no distress Head: Normocephalic, without obvious abnormality, atraumatic Lungs: Respirations even and unlabored Extremities: No gross deformities Skin: Skin color, texture, turgor normal. No rashes seen  Psych: Appropriate mood and affect. Neurologic: Mental status: Alert, oriented to person, place, and time, thought content appropriate.   Results for orders placed or performed in visit on 08/13/15  POCT HgB A1C  Result Value Ref Range   Hemoglobin A1C 6.5    Est. average glucose Bld gHb Est-mCnc 140        Assessment & Plan:     .1. Controlled type 2 diabetes mellitus without complication, without long-term current use of insulin ([place]) Well controlled.  Continue current medications.   - POCT HgB A1C - POCT UA - Microalbumin  2. Essential hypertension Well controlled.  Continue current medications.     Follow up 4 months.       Lelon Huh, MD  [facility]

## 2015-08-13 NOTE — Patient Instructions (Signed)
Take one large spoonful of Fibercon mixed with liquid daily to help with cholesterol

## 2015-09-02 ENCOUNTER — Other Ambulatory Visit: Payer: Self-pay | Admitting: Family Medicine

## 2015-10-15 ENCOUNTER — Other Ambulatory Visit: Payer: Self-pay | Admitting: Family Medicine

## 2015-10-16 ENCOUNTER — Other Ambulatory Visit: Payer: Self-pay | Admitting: Family Medicine

## 2015-11-22 DIAGNOSIS — E119 Type 2 diabetes mellitus without complications: Secondary | ICD-10-CM | POA: Diagnosis not present

## 2016-01-07 ENCOUNTER — Ambulatory Visit (INDEPENDENT_AMBULATORY_CARE_PROVIDER_SITE_OTHER): Payer: Commercial Managed Care - HMO | Admitting: Family Medicine

## 2016-01-07 ENCOUNTER — Encounter: Payer: Self-pay | Admitting: Family Medicine

## 2016-01-07 VITALS — BP 130/62 | HR 62 | Temp 98.9°F | Resp 16 | Wt 181.0 lb

## 2016-01-07 DIAGNOSIS — M5136 Other intervertebral disc degeneration, lumbar region: Secondary | ICD-10-CM | POA: Diagnosis not present

## 2016-01-07 DIAGNOSIS — E039 Hypothyroidism, unspecified: Secondary | ICD-10-CM

## 2016-01-07 DIAGNOSIS — E1142 Type 2 diabetes mellitus with diabetic polyneuropathy: Secondary | ICD-10-CM

## 2016-01-07 LAB — POCT GLYCOSYLATED HEMOGLOBIN (HGB A1C)
ESTIMATED AVERAGE GLUCOSE: 140
Hemoglobin A1C: 6.5

## 2016-01-07 NOTE — Progress Notes (Signed)
Patient: [name] [name] Female    DOB: [birth year]   82 y.o.   MRN: [id number] Visit Date: 01/07/2016  Today's Provider: Lelon Huh, MD   Chief Complaint  Patient presents with  . Hypertension    follow up  . Diabetes    follow up  . Hypothyroidism    follow up   Subjective:    HPI  Hypertension, follow-up:  BP Readings from Last 3 Encounters:  08/13/15 130/66  05/27/15 130/70  05/06/15 136/63    She was last seen for hypertension 5 months ago.  BP at that visit was 130/66. Management since that visit includes no chages. She reports good compliance with treatment. She is not having side effects.  She is not exercising. She is adherent to low salt diet.   Outside blood pressures are not being checked. She is experiencing none.  Patient denies chest pain, chest pressure/discomfort, claudication, dyspnea, exertional chest pressure/discomfort, fatigue, irregular heart beat, lower extremity edema, near-syncope and orthopnea.   Cardiovascular risk factors include advanced age (older than 31 for men, 55 for women), diabetes mellitus and hypertension.  Use of agents associated with hypertension: NSAIDS.     Weight trend: fluctuating a bit Wt Readings from Last 3 Encounters:  08/13/15 178 lb (80.74 kg)  05/27/15 181 lb 12.8 oz (82.464 kg)  04/01/15 179 lb (81.194 kg)    Current diet: in general, a "healthy" diet    ------------------------------------------------------------------------   Diabetes Mellitus Type II, Follow-up:   Lab Results  Component Value Date   HGBA1C 6.5 08/13/2015   HGBA1C 6.7* 04/01/2015   HGBA1C 6.4* 12/17/2014    Last seen for diabetes 5 months ago.  Management since then includes no changes. She reports good compliance with treatment. She is not having side effects.  Current symptoms include paresthesia of the feet and have been stable. Home blood sugar records: fasting range: 78-100  Episodes of hypoglycemia? Yes.  occasionally occurs when patient doesn't eat enough before going to bed   Current Insulin Regimen: none Most Recent Eye Exam: 1 year ago Weight trend: fluctuating a bit Prior visit with dietician: no Current diet: in general, a "healthy" diet   Current exercise: none  Pertinent Labs:    Component Value Date/Time   CHOL 202* 04/01/2015 0955   CHOL 219* 12/17/2014   TRIG 105 04/01/2015 0955   HDL 41 04/01/2015 0955   HDL 48 12/17/2014   LDLCALC 140* 04/01/2015 0955   LDLCALC 152 12/17/2014    Wt Readings from Last 3 Encounters:  08/13/15 178 lb (80.74 kg)  05/27/15 181 lb 12.8 oz (82.464 kg)  04/01/15 179 lb (81.194 kg)    ------------------------------------------------------------------------  Follow up Hypothyroidism:  Last office visit was 9 months ago and no changes were made. Patient reports good compliance with treatment and good tolerance.     Allergies  Allergen Reactions  . Lovastatin     Weakness  . Pravastatin Sodium     Muscle weakness  . Simvastatin     Fatigue   Previous Medications   AMLODIPINE (NORVASC) 5 MG TABLET    Take 1 tablet by mouth daily.   ASPIRIN 81 MG TABLET    Take 1 tablet by mouth daily.   CHOLECALCIFEROL (VITAMIN D) 1000 UNITS TABLET    Take 1 tablet by mouth daily.   FUROSEMIDE (LASIX) 20 MG TABLET    Take 1 tablet (20 mg total) by mouth daily.   GLIPIZIDE (GLUCOTROL XL) 10  MG 24 HR TABLET    TAKE 1 TABLET EVERY DAY   HYDROCODONE-ACETAMINOPHEN (NORCO/VICODIN) 5-325 MG PER TABLET    Take by mouth.   LEVOTHYROXINE (SYNTHROID, LEVOTHROID) 75 MCG TABLET    TAKE 1 TABLET EVERY DAY   METFORMIN (GLUCOPHAGE-XR) 750 MG 24 HR TABLET    TAKE 1 TABLET TWICE DAILY   OMEGA-3 FATTY ACIDS (FISH OIL) 1000 MG CAPS    Take 1 capsule by mouth daily.   OXYBUTYNIN (DITROPAN) 5 MG TABLET    1 tablet 2-3 times daily as needed for urinary frequency   QUINAPRIL (ACCUPRIL) 40 MG TABLET    TAKE 2 TABLETS EVERY DAY   VITAMIN B-12 (CYANOCOBALAMIN) 100 MCG  TABLET    Take 1 tablet by mouth daily.    Review of Systems  Constitutional: Negative for fever, chills, appetite change and fatigue.  Respiratory: Negative for chest tightness and shortness of breath.   Cardiovascular: Negative for chest pain, palpitations and leg swelling.  Gastrointestinal: Negative for nausea, vomiting and abdominal pain.  Endocrine: Negative for cold intolerance, heat intolerance, polydipsia, polyphagia and polyuria.  Neurological: Positive for numbness (in feet). Negative for dizziness and weakness.    Social History  Substance Use Topics  . Smoking status: Never Smoker   . Smokeless tobacco: Not on file  . Alcohol Use: No   Objective:   BP 130/62 mmHg  Pulse 62  Temp(Src) 98.9 F (37.2 C) (Oral)  Resp 16  Wt 181 lb (82.101 kg)  SpO2 97%  Physical Exam  General Appearance:    Alert, cooperative, no distress, obese  Eyes:    PERRL, conjunctiva/corneas clear, EOM's intact       Lungs:     Clear to auscultation bilaterally, respirations unlabored  Heart:    Regular rate and rhythm  Neurologic:   Awake, alert, oriented x 3. Diminished somatosensation both feet. .           Assessment & Plan:           Lelon Huh, MD  [facility]

## 2016-01-08 LAB — RENAL FUNCTION PANEL
Albumin: 4.5 g/dL (ref 3.5–4.7)
BUN/Creatinine Ratio: 13 (ref 12–28)
BUN: 11 mg/dL (ref 8–27)
CALCIUM: 9.9 mg/dL (ref 8.7–10.3)
CO2: 24 mmol/L (ref 18–29)
Chloride: 98 mmol/L (ref 96–106)
Creatinine, Ser: 0.86 mg/dL (ref 0.57–1.00)
GFR calc Af Amer: 73 mL/min/{1.73_m2} (ref >59)
GFR calc non Af Amer: 64 mL/min/{1.73_m2} (ref >59)
Glucose: 91 mg/dL (ref 65–99)
PHOSPHORUS: 3.5 mg/dL (ref 2.5–4.5)
Potassium: 3.9 mmol/L (ref 3.5–5.2)
SODIUM: 143 mmol/L (ref 134–144)

## 2016-01-08 LAB — TSH: TSH: 0.589 u[IU]/mL (ref 0.450–4.500)

## 2016-02-05 ENCOUNTER — Other Ambulatory Visit: Payer: Self-pay | Admitting: Family Medicine

## 2016-02-26 DIAGNOSIS — E119 Type 2 diabetes mellitus without complications: Secondary | ICD-10-CM | POA: Diagnosis not present

## 2016-03-21 ENCOUNTER — Other Ambulatory Visit: Payer: Self-pay | Admitting: Family Medicine

## 2016-04-13 ENCOUNTER — Other Ambulatory Visit: Payer: Self-pay | Admitting: Family Medicine

## 2016-05-06 DIAGNOSIS — Z1231 Encounter for screening mammogram for malignant neoplasm of breast: Secondary | ICD-10-CM | POA: Diagnosis not present

## 2016-05-15 ENCOUNTER — Encounter: Payer: Self-pay | Admitting: Family Medicine

## 2016-06-09 DIAGNOSIS — E119 Type 2 diabetes mellitus without complications: Secondary | ICD-10-CM | POA: Diagnosis not present

## 2016-06-24 ENCOUNTER — Encounter: Payer: Self-pay | Admitting: Family Medicine

## 2016-06-24 ENCOUNTER — Ambulatory Visit (INDEPENDENT_AMBULATORY_CARE_PROVIDER_SITE_OTHER): Payer: Commercial Managed Care - HMO | Admitting: Family Medicine

## 2016-06-24 VITALS — BP 132/68 | HR 68 | Temp 99.0°F | Resp 16 | Wt 179.0 lb

## 2016-06-24 DIAGNOSIS — E1149 Type 2 diabetes mellitus with other diabetic neurological complication: Secondary | ICD-10-CM

## 2016-06-24 DIAGNOSIS — Z23 Encounter for immunization: Secondary | ICD-10-CM | POA: Diagnosis not present

## 2016-06-24 DIAGNOSIS — R200 Anesthesia of skin: Secondary | ICD-10-CM

## 2016-06-24 DIAGNOSIS — R5383 Other fatigue: Secondary | ICD-10-CM

## 2016-06-24 DIAGNOSIS — R202 Paresthesia of skin: Secondary | ICD-10-CM

## 2016-06-24 DIAGNOSIS — I10 Essential (primary) hypertension: Secondary | ICD-10-CM

## 2016-06-24 LAB — POCT GLYCOSYLATED HEMOGLOBIN (HGB A1C)
Est. average glucose Bld gHb Est-mCnc: 140
Hemoglobin A1C: 6.5

## 2016-06-24 MED ORDER — GLIPIZIDE ER 5 MG PO TB24: 5.0000 mg | ORAL_TABLET | Freq: Every day | ORAL | 2 refills | Status: DC

## 2016-06-24 NOTE — Progress Notes (Signed)
Patient: [name] [name] Female    DOB: December 05, 1932   82 y.o.   MRN: [id number] Visit Date: 06/24/2016  Today's Provider: Lelon Huh, MD   Chief Complaint  Patient presents with  . Diabetes  . Hypertension  . Fatigue   Subjective:    HPI  Diabetes Mellitus Type II, Follow-up:   Lab Results  Component Value Date   HGBA1C 6.5 06/24/2016   HGBA1C 6.5 01/07/2016   HGBA1C 6.5 08/13/2015    Last seen for diabetes 10 months ago.  Management since then includes no changes. She reports good compliance with treatment. She is not having side effects.  Current symptoms include hypoglycemia  and paresthesia of the feet and have been worsening. Home blood sugar records: fasting range: 100  Episodes of hypoglycemia? yes - Patient reports that it mainly happens at night. She reports that it has gotten as low as 69.    Most Recent Eye Exam: 2 years ago.  Weight trend: stable Prior visit with dietician: no Current diet: well balanced Current exercise: none  Pertinent Labs:    Component Value Date/Time   CHOL 202 (H) 04/01/2015 0955   TRIG 105 04/01/2015 0955   HDL 41 04/01/2015 0955   LDLCALC 140 (H) 04/01/2015 0955   CREATININE 0.86 01/07/2016 1157    Wt Readings from Last 3 Encounters:  06/24/16 179 lb (81.2 kg)  01/07/16 181 lb (82.1 kg)  08/13/15 178 lb (80.7 kg)      Hypertension, follow-up:  BP Readings from Last 3 Encounters:  06/24/16 132/68  01/07/16 130/62  08/13/15 130/66    She was last seen for hypertension 10 months ago.  BP at that visit was 130/66. Management since that visit includes no changes. She reports good compliance with treatment. She is not having side effects.  She is not exercising. She is adherent to low salt diet.   Outside blood pressures are not being checked. Patient denies chest pain, irregular heart beat and palpitations.   Cardiovascular risk factors include diabetes mellitus.   Weight trend: stable Wt  Readings from Last 3 Encounters:  06/24/16 179 lb (81.2 kg)  01/07/16 181 lb (82.1 kg)  08/13/15 178 lb (80.7 kg)    Current diet: well balanced   She does complain of feeling in increasingly fatigued over the last few years. No dyspnea. Did well with PT over the summer.   Lab Results  Component Value Date   TSH 0.589 01/07/2016         Allergies  Allergen Reactions  . Lovastatin     Weakness  . Pravastatin Sodium     Muscle weakness  . Simvastatin     Fatigue     Current Outpatient Prescriptions:  .  amLODipine (NORVASC) 5 MG tablet, TAKE 1 TABLET EVERY DAY, Disp: 90 tablet, Rfl: 4 .  aspirin 81 MG tablet, Take 1 tablet by mouth daily., Disp: , Rfl:  .  cholecalciferol (VITAMIN D) 1000 UNITS tablet, Take 1 tablet by mouth daily., Disp: , Rfl:  .  furosemide (LASIX) 20 MG tablet, TAKE 1 TABLET EVERY DAY, Disp: 90 tablet, Rfl: 3 .  glipiZIDE (GLUCOTROL XL) 10 MG 24 hr tablet, TAKE 1 TABLET EVERY DAY, Disp: 90 tablet, Rfl: 4 .  HYDROcodone-acetaminophen (NORCO/VICODIN) 5-325 MG per tablet, Take by mouth., Disp: , Rfl:  .  levothyroxine (SYNTHROID, LEVOTHROID) 75 MCG tablet, TAKE 1 TABLET EVERY DAY, Disp: 90 tablet, Rfl: 4 .  metFORMIN (GLUCOPHAGE-XR) 750 MG  24 hr tablet, TAKE 1 TABLET TWICE DAILY, Disp: 180 tablet, Rfl: 3 .  Omega-3 Fatty Acids (FISH OIL) 1000 MG CAPS, Take 1 capsule by mouth daily., Disp: , Rfl:  .  oxybutynin (DITROPAN) 5 MG tablet, TAKE 1 TABLET  2  TO  3  TIMES  DAILY AS NEEDED  FOR  URINARY FREQUENCY, Disp: 180 tablet, Rfl: 1 .  quinapril (ACCUPRIL) 40 MG tablet, TAKE 2 TABLETS EVERY DAY, Disp: 180 tablet, Rfl: 3 .  vitamin B-12 (CYANOCOBALAMIN) 100 MCG tablet, Take 1 tablet by mouth daily., Disp: , Rfl:   Review of Systems  Respiratory: Negative.   Cardiovascular: Negative.   Endocrine: Negative.   Genitourinary: Negative for flank pain.  Musculoskeletal: Negative.   Neurological: Positive for dizziness, weakness and light-headedness.     Social History  Substance Use Topics  . Smoking status: Never Smoker  . Smokeless tobacco: Not on file  . Alcohol use No   Objective:   BP 132/68 (BP Location: Right Arm, Patient Position: Sitting, Cuff Size: Normal)   Pulse 68   Temp 99 F (37.2 C)   Resp 16   Wt 179 lb (81.2 kg)   BMI 31.21 kg/m   Physical Exam   General Appearance:    Alert, cooperative, no distress  Eyes:    PERRL, conjunctiva/corneas clear, EOM's intact       Lungs:     Clear to auscultation bilaterally, respirations unlabored  Heart:    Regular rate and rhythm. II/VI systolic murmur.   Neurologic:   Awake, alert, oriented x 3. No apparent focal neurological           defect.       Results for orders placed or performed in visit on 06/24/16  POCT glycosylated hemoglobin (Hb A1C)  Result Value Ref Range   Hemoglobin A1C 6.5    Est. average glucose Bld gHb Est-mCnc 140        Assessment & Plan:     1. Essential hypertension Well controlled.  Continue current medications.    2. Diabetes mellitus type 2 with neurological manifestations ([place]) Well controlled.  Continue current medications.   - POCT glycosylated hemoglobin (Hb A1C)  3. Need for influenza vaccination  - Flu vaccine HIGH DOSE PF (Fluzone High dose)  4. Numbness and tingling of foot  - Vitamin B12  5. Other fatigue  - Comprehensive metabolic panel - TSH - Vitamin B12 - CBC  Return in about 6 months (around 12/23/2016).       Lelon Huh, MD  [facility]

## 2016-06-25 LAB — CBC
HEMATOCRIT: 38.1 % (ref 34.0–46.6)
Hemoglobin: 12.5 g/dL (ref 11.1–15.9)
MCH: 26.9 pg (ref 26.6–33.0)
MCHC: 32.8 g/dL (ref 31.5–35.7)
MCV: 82 fL (ref 79–97)
PLATELETS: 272 10*3/uL (ref 150–379)
RBC: 4.65 x10E6/uL (ref 3.77–5.28)
RDW: 14.2 % (ref 12.3–15.4)
WBC: 7.3 10*3/uL (ref 3.4–10.8)

## 2016-06-25 LAB — COMPREHENSIVE METABOLIC PANEL
A/G RATIO: 1.3 (ref 1.2–2.2)
ALK PHOS: 67 IU/L (ref 39–117)
ALT: 18 IU/L (ref 0–32)
AST: 15 IU/L (ref 0–40)
Albumin: 4.2 g/dL (ref 3.5–4.7)
BILIRUBIN TOTAL: 0.6 mg/dL (ref 0.0–1.2)
BUN/Creatinine Ratio: 13 (ref 12–28)
BUN: 12 mg/dL (ref 8–27)
CALCIUM: 9.8 mg/dL (ref 8.7–10.3)
CHLORIDE: 102 mmol/L (ref 96–106)
CO2: 28 mmol/L (ref 18–29)
Creatinine, Ser: 0.92 mg/dL (ref 0.57–1.00)
GFR calc Af Amer: 68 mL/min/{1.73_m2} (ref >59)
GFR, EST NON AFRICAN AMERICAN: 59 mL/min/{1.73_m2} — AB (ref >59)
GLOBULIN, TOTAL: 3.2 g/dL (ref 1.5–4.5)
Glucose: 83 mg/dL (ref 65–99)
POTASSIUM: 3.9 mmol/L (ref 3.5–5.2)
SODIUM: 146 mmol/L — AB (ref 134–144)
Total Protein: 7.4 g/dL (ref 6.0–8.5)

## 2016-06-25 LAB — TSH: TSH: 0.79 u[IU]/mL (ref 0.450–4.500)

## 2016-06-25 LAB — VITAMIN B12: VITAMIN B 12: 1375 pg/mL — AB (ref 211–946)

## 2016-07-03 ENCOUNTER — Telehealth: Payer: Self-pay | Admitting: Family Medicine

## 2016-07-03 NOTE — Telephone Encounter (Signed)
Called Pt to schedule AWV with [facility] - knb °

## 2016-08-25 ENCOUNTER — Encounter: Payer: Self-pay | Admitting: Family Medicine

## 2016-08-25 ENCOUNTER — Ambulatory Visit (INDEPENDENT_AMBULATORY_CARE_PROVIDER_SITE_OTHER): Payer: Commercial Managed Care - HMO | Admitting: Family Medicine

## 2016-08-25 VITALS — BP 132/62 | HR 55 | Temp 98.4°F | Resp 16 | Wt 182.0 lb

## 2016-08-25 DIAGNOSIS — Z0000 Encounter for general adult medical examination without abnormal findings: Secondary | ICD-10-CM

## 2016-08-25 MED ORDER — QUINAPRIL HCL 40 MG PO TABS: 80.0000 mg | ORAL_TABLET | Freq: Every day | ORAL | 4 refills | Status: DC

## 2016-08-25 MED ORDER — OXYBUTYNIN CHLORIDE 5 MG PO TABS: ORAL_TABLET | ORAL | 4 refills | Status: DC

## 2016-08-25 MED ORDER — METFORMIN HCL ER 750 MG PO TB24: 750.0000 mg | ORAL_TABLET | Freq: Two times a day (BID) | ORAL | 4 refills | Status: DC

## 2016-08-25 NOTE — Progress Notes (Signed)
Patient: [name] [name] Female    DOB: 11-11-1932   82 y.o.   MRN: [id number] Visit Date: 08/25/2016  Today's Provider: Lelon Huh, MD   Chief Complaint  Patient presents with  . Diabetes   Subjective:    HPI  Diabetes Mellitus Type II, Follow-up:   Lab Results  Component Value Date   HGBA1C 6.5 06/24/2016   HGBA1C 6.5 01/07/2016   HGBA1C 6.5 08/13/2015    Last seen for diabetes 2 months ago.  Management since then includes no changes. She reports good compliance with treatment. She is not having side effects.  Current symptoms include none and have been stable. Home blood sugar records: fasting range: 86, this morning.   Episodes of hypoglycemia? no   Current Insulin Regimen: none Most Recent Eye Exam: due Weight trend: stable Prior visit with dietician: no Current diet: well balanced Current exercise: walking  Pertinent Labs:    Component Value Date/Time   CHOL 202 (H) 04/01/2015 0955   TRIG 105 04/01/2015 0955   HDL 41 04/01/2015 0955   LDLCALC 140 (H) 04/01/2015 0955   CREATININE 0.92 06/24/2016 0939    Wt Readings from Last 3 Encounters:  08/25/16 182 lb (82.6 kg)  06/24/16 179 lb (81.2 kg)  01/07/16 181 lb (82.1 kg)     Cognitive Testing - 6-CIT  Correct? Score   What year is it? yes 0 0 or 4  What month is it? yes 0 0 or 3  Memorize:    Pia Mau,  44,  [address],  [place],      What time is it? (within 1 hour) yes 0 0 or 3  Count backwards from 20 yes 0 0, 2, or 4  Name the months of the year yes 0 0, 2, or 4  Repeat name & address above yes 0 0, 2, 4, 6, 8, or 10       TOTAL SCORE  0/28   Interpretation:  Normal  Normal (0-7) Abnormal (8-28)     Functional Status Survey: Is the patient deaf or have difficulty hearing?: No Does the patient have difficulty seeing, even when wearing glasses/contacts?: Yes Does the patient have difficulty concentrating, remembering, or making decisions?: No Does the patient have  difficulty walking or climbing stairs?: No Does the patient have difficulty dressing or bathing?: No Does the patient have difficulty doing errands alone such as visiting a doctor's office or shopping?: Yes  Depression screen PHQ 2/9 08/25/2016  Decreased Interest 0  Down, Depressed, Hopeless 0  PHQ - 2 Score 0  Altered sleeping -  Tired, decreased energy -  Change in appetite -  Feeling bad or failure about yourself  -  Trouble concentrating -  Moving slowly or fidgety/restless -  Suicidal thoughts -  PHQ-9 Score -  Difficult doing work/chores -      Allergies  Allergen Reactions  . Lovastatin     Weakness  . Pravastatin Sodium     Muscle weakness  . Simvastatin     Fatigue     Current Outpatient Prescriptions:  .  amLODipine (NORVASC) 5 MG tablet, TAKE 1 TABLET EVERY DAY, Disp: 90 tablet, Rfl: 4 .  aspirin 81 MG tablet, Take 1 tablet by mouth daily., Disp: , Rfl:  .  cholecalciferol (VITAMIN D) 1000 UNITS tablet, Take 1 tablet by mouth daily., Disp: , Rfl:  .  furosemide (LASIX) 20 MG tablet, TAKE 1 TABLET EVERY DAY, Disp: 90 tablet,  Rfl: 3 .  glipiZIDE (GLUCOTROL XL) 5 MG 24 hr tablet, Take 1 tablet (5 mg total) by mouth daily., Disp: 90 tablet, Rfl: 2 .  HYDROcodone-acetaminophen (NORCO/VICODIN) 5-325 MG per tablet, Take by mouth., Disp: , Rfl:  .  levothyroxine (SYNTHROID, LEVOTHROID) 75 MCG tablet, TAKE 1 TABLET EVERY DAY, Disp: 90 tablet, Rfl: 4 .  metFORMIN (GLUCOPHAGE-XR) 750 MG 24 hr tablet, TAKE 1 TABLET TWICE DAILY, Disp: 180 tablet, Rfl: 3 .  Omega-3 Fatty Acids (FISH OIL) 1000 MG CAPS, Take 1 capsule by mouth daily., Disp: , Rfl:  .  oxybutynin (DITROPAN) 5 MG tablet, TAKE 1 TABLET  2  TO  3  TIMES  DAILY AS NEEDED  FOR  URINARY FREQUENCY, Disp: 180 tablet, Rfl: 1 .  quinapril (ACCUPRIL) 40 MG tablet, TAKE 2 TABLETS EVERY DAY, Disp: 180 tablet, Rfl: 3 .  vitamin B-12 (CYANOCOBALAMIN) 100 MCG tablet, Take 1 tablet by mouth daily., Disp: , Rfl:   Review of  Systems  Constitutional: Negative.   Respiratory: Negative.   Cardiovascular: Negative.   Endocrine: Negative.   Musculoskeletal: Negative.   Psychiatric/Behavioral: Negative.     Social History  Substance Use Topics  . Smoking status: Never Smoker  . Smokeless tobacco: Not on file  . Alcohol use No         Patient: [name] Shenker, Female    DOB: August 31, 1933, 82 y.o.   MRN: [id number] Visit Date: 08/25/2016  Today's Provider: Lelon Huh, MD   Chief Complaint  Patient presents with  . Diabetes   Subjective:    Annual wellness visit [name] Bonnie is a 82 y.o. female. She feels fairly well. She reports exercising rarely. She reports she is sleeping fairly well.  -----------------------------------------------------------   ROS:  Social History   Social History  . Marital status: Married    Spouse name: N/A  . Number of children: 3  . Years of education: N/A   Occupational History  . Retired     Social History Main Topics  . Smoking status: Never Smoker  . Smokeless tobacco: Not on file  . Alcohol use No  . Drug use: No  . Sexual activity: Not on file   Other Topics Concern  . Not on file   Social History Narrative  . No narrative on file    Past Medical History:  Diagnosis Date  . Allergy   . Arthritis   . Diabetes mellitus without complication ([place])   . Hyperlipidemia   . Hypertension   . Thyroid disease     Patient Active Problem List   Diagnosis Date Noted  . Polyuria 04/01/2015  . Breast fibroadenoma 03/27/2015  . Bursitis of hip 03/27/2015  . Diabetes mellitus type 2 with neurological manifestations ([place]) 03/27/2015  . Edema 03/27/2015  . HLD (hyperlipidemia) 03/27/2015  . Hypertension 03/27/2015  . Hypothyroid 03/27/2015  . Lactose intolerance 03/27/2015  . Lumbar disc disease 03/27/2015  . Nocturia 03/27/2015  . Osteoarthritis 03/27/2015  . Lumbar canal stenosis 03/27/2015  . Subcoracoid bursitis 03/27/2015  . B12  deficiency 03/27/2015  . Arthritis, degenerative 12/31/2014  . 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 Enthesopathy of hip 12/31/2014  . Dizziness and giddiness 12/31/2014  . Calcification of intervertebral cartilage or disc of lumbar region 12/31/2014  . Benign breast neoplasm 12/31/2014  . 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 DDD (degenerative disc disease), lumbar 03/27/2014  . Neuritis or radiculitis due to rupture of lumbar intervertebral disc 03/26/2014    Past Surgical  History:  Procedure Laterality Date  . ABDOMINAL HYSTERECTOMY    . THYROIDECTOMY, PARTIAL  1994  . TONSILLECTOMY      Her family history is not on file.    Family Status  Relation Status  . Mother Deceased  . Father Deceased     Current Outpatient Prescriptions:  .  amLODipine (NORVASC) 5 MG tablet, TAKE 1 TABLET EVERY DAY, Disp: 90 tablet, Rfl: 4 .  aspirin 81 MG tablet, Take 1 tablet by mouth daily., Disp: , Rfl:  .  cholecalciferol (VITAMIN D) 1000 UNITS tablet, Take 1 tablet by mouth daily., Disp: , Rfl:  .  furosemide (LASIX) 20 MG tablet, TAKE 1 TABLET EVERY DAY, Disp: 90 tablet, Rfl: 3 .  glipiZIDE (GLUCOTROL XL) 5 MG 24 hr tablet, Take 1 tablet (5 mg total) by mouth daily., Disp: 90 tablet, Rfl: 2 .  HYDROcodone-acetaminophen (NORCO/VICODIN) 5-325 MG per tablet, Take by mouth., Disp: , Rfl:  .  levothyroxine (SYNTHROID, LEVOTHROID) 75 MCG tablet, TAKE 1 TABLET EVERY DAY, Disp: 90 tablet, Rfl: 4 .  metFORMIN (GLUCOPHAGE-XR) 750 MG 24 hr tablet, Take 1 tablet (750 mg total) by mouth 2 (two) times daily., Disp: 180 tablet, Rfl: 4 .  Omega-3 Fatty Acids (FISH OIL) 1000 MG CAPS, Take 1 capsule by mouth daily., Disp: , Rfl:  .  oxybutynin (DITROPAN) 5 MG tablet, TAKE 1 TABLET  2  TO  3  TIMES  DAILY AS NEEDED  FOR  URINARY FREQUENCY, Disp: 180 tablet, Rfl: 4 .  quinapril (ACCUPRIL) 40 MG tablet, Take 2 tablets (80 mg total) by mouth daily., Disp: 180 tablet, Rfl: 4 .  vitamin B-12  (CYANOCOBALAMIN) 100 MCG tablet, Take 1 tablet by mouth daily., Disp: , Rfl:    Patient Care Team: Birdie Sons, MD as PCP - General (Family Medicine) Sharlet Salina, MD as Referring Physician (Physical Medicine and Rehabilitation)      Objective:   Vitals: BP 132/62 (BP Location: Left Arm, Patient Position: Sitting, Cuff Size: Normal)   Pulse (!) 55   Temp 98.4 F (36.9 C)   Resp 16   Wt 182 lb (82.6 kg)   BMI 31.73 kg/m   Exam:  Activities of Daily Living In your present state of health, do you have any difficulty performing the following activities: 08/25/2016  Hearing? N  Vision? Y  Difficulty concentrating or making decisions? N  Walking or climbing stairs? N  Dressing or bathing? N  Doing errands, shopping? Y  Some recent data might be hidden    Fall Risk Assessment Fall Risk  08/25/2016 08/13/2015  Falls in the past year? No No     Depression Screen PHQ 2/9 Scores 08/25/2016 08/13/2015  PHQ - 2 Score 0 0  PHQ- 9 Score - 1       Assessment & Plan:     Annual Wellness Visit  Reviewed patient's Family Medical History Reviewed and updated list of patient's medical providers Assessment of cognitive impairment was done Assessed patient's functional ability Established a written schedule for health screening [place] Completed and Reviewed  Exercise Activities and Dietary recommendations Goals    None      Immunization History  Administered Date(s) Administered  . Influenza, High Dose Seasonal PF 05/27/2015, 06/24/2016  . Pneumococcal Conjugate-13 08/07/2014  . Pneumococcal Polysaccharide-23 12/31/2000  . Td 02/19/1998  . Tdap 06/20/2011  . Zoster 12/13/2007    Health Maintenance  Topic Date Due  . FOOT EXAM  10/25/1944  .  OPHTHALMOLOGY EXAM  10/25/1944  . HEMOGLOBIN A1C  12/23/2016  . MAMMOGRAM  05/06/2017  . TETANUS/TDAP  06/19/2021  . INFLUENZA VACCINE  Completed  . DEXA SCAN  Completed  . ZOSTAVAX   Completed  . PNA vac Low Risk Adult  Completed     Discussed health benefits of physical activity, and encouraged her to engage in regular exercise appropriate for her age and condition.    ------------------------------------------------------------------------------------------------------------   Objective:   BP 132/62 (BP Location: Left Arm, Patient Position: Sitting, Cuff Size: Normal)   Pulse (!) 55   Temp 98.4 F (36.9 C)   Resp 16   Wt 182 lb (82.6 kg)   BMI 31.73 kg/m   Physical Exam  General appearance: alert, well developed, well nourished, cooperative and in no distress Head: Normocephalic, without obvious abnormality, atraumatic Respiratory: Respirations even and unlabored, normal respiratory rate Extremities: No gross deformities Skin: Skin color, texture, turgor normal. No rashes seen  Psych: Appropriate mood and affect. Neurologic: Mental status: Alert, oriented to person, place, and time, thought content appropriate.     Assessment & Plan:     1. Medicare annual wellness visit, subsequent Doing fairly well.   Follow up 2-3 months for chronic medical conditions.       The entirety of the information documented in the History of Present Illness, Review of Systems and Physical Exam were personally obtained by me. Portions of this information were initially documented by Wilburt Finlay, CMA and reviewed by me for thoroughness and accuracy.     Lelon Huh, MD  [facility]

## 2016-09-09 ENCOUNTER — Other Ambulatory Visit: Payer: Self-pay | Admitting: Family Medicine

## 2016-10-01 DIAGNOSIS — E119 Type 2 diabetes mellitus without complications: Secondary | ICD-10-CM | POA: Diagnosis not present

## 2016-10-19 ENCOUNTER — Telehealth: Payer: Self-pay | Admitting: Family Medicine

## 2016-10-19 NOTE — Telephone Encounter (Signed)
Pt needs a refill on the gab

## 2016-10-22 ENCOUNTER — Ambulatory Visit (INDEPENDENT_AMBULATORY_CARE_PROVIDER_SITE_OTHER): Payer: Medicare HMO | Admitting: Family Medicine

## 2016-10-22 ENCOUNTER — Encounter: Payer: Self-pay | Admitting: Family Medicine

## 2016-10-22 VITALS — BP 120/78 | HR 60 | Temp 98.6°F | Resp 16 | Wt 182.0 lb

## 2016-10-22 DIAGNOSIS — M5136 Other intervertebral disc degeneration, lumbar region: Secondary | ICD-10-CM | POA: Diagnosis not present

## 2016-10-22 MED ORDER — GABAPENTIN 300 MG PO CAPS: 300.0000 mg | ORAL_CAPSULE | Freq: Two times a day (BID) | ORAL | 5 refills | Status: DC

## 2016-10-22 NOTE — Progress Notes (Signed)
Patient: [name] [name] Female    DOB: June 12, 1933   83 y.o.   MRN: [id number] Visit Date: 10/22/2016  Today's Provider: Lelon Huh, MD   Chief Complaint  Patient presents with  . Back Pain   Subjective:    Back Pain  This is a recurrent problem. The current episode started in the past 7 days. The problem has been gradually worsening since onset. The pain is present in the lumbar spine. The quality of the pain is described as aching and shooting. Radiates to: right leg. The pain is moderate. The pain is the same all the time. The symptoms are aggravated by bending. Stiffness is present all day. Pertinent negatives include no fever.   Patient reports that she always have back pain. However, her pain seemed to worsen over the last week. Patient denies any injuries. Had several injections by Dr. Sharlet Salina in 2016 which were hlpful, but pain is returning in same area. She reports that she has been taking Gabapentin 300mg  capsules which was originally prescribed by Dr. Sharlet Salina, and that seemed to help with the pain. Patient is requesting a refill today.      Allergies  Allergen Reactions  . Lovastatin     Weakness  . Pravastatin Sodium     Muscle weakness  . Simvastatin     Fatigue     Current Outpatient Prescriptions:  .  amLODipine (NORVASC) 5 MG tablet, TAKE 1 TABLET EVERY DAY, Disp: 90 tablet, Rfl: 4 .  aspirin 81 MG tablet, Take 1 tablet by mouth daily., Disp: , Rfl:  .  cholecalciferol (VITAMIN D) 1000 UNITS tablet, Take 1 tablet by mouth daily., Disp: , Rfl:  .  furosemide (LASIX) 20 MG tablet, TAKE 1 TABLET EVERY DAY, Disp: 90 tablet, Rfl: 3 .  glipiZIDE (GLUCOTROL XL) 5 MG 24 hr tablet, Take 1 tablet (5 mg total) by mouth daily., Disp: 90 tablet, Rfl: 2 .  levothyroxine (SYNTHROID, LEVOTHROID) 75 MCG tablet, TAKE 1 TABLET EVERY DAY, Disp: 90 tablet, Rfl: 4 .  metFORMIN (GLUCOPHAGE-XR) 750 MG 24 hr tablet, Take 1 tablet (750 mg total) by mouth 2 (two) times daily.,  Disp: 180 tablet, Rfl: 4 .  Omega-3 Fatty Acids (FISH OIL) 1000 MG CAPS, Take 1 capsule by mouth daily., Disp: , Rfl:  .  quinapril (ACCUPRIL) 40 MG tablet, Take 2 tablets (80 mg total) by mouth daily., Disp: 180 tablet, Rfl: 4 .  vitamin B-12 (CYANOCOBALAMIN) 100 MCG tablet, Take 1 tablet by mouth daily., Disp: , Rfl:  .  gabapentin (NEURONTIN) 300 MG capsule, Take 1 capsule (300 mg total) by mouth 2 (two) times daily., Disp: 60 capsule, Rfl: 5 .  oxybutynin (DITROPAN) 5 MG tablet, TAKE 1 TABLET  2  TO  3  TIMES  DAILY AS NEEDED  FOR  URINARY FREQUENCY, Disp: 180 tablet, Rfl: 1  Review of Systems  Constitutional: Positive for activity change. Negative for appetite change, chills, diaphoresis, fatigue, fever and unexpected weight change.  Musculoskeletal: Positive for back pain and myalgias.    Social History  Substance Use Topics  . Smoking status: Never Smoker  . Smokeless tobacco: Never Used  . Alcohol use No   Objective:   BP 120/78 (BP Location: Left Arm, Patient Position: Sitting, Cuff Size: Normal)   Pulse 60   Temp 98.6 F (37 C)   Resp 16   Wt 182 lb (82.6 kg)   BMI 31.73 kg/m   Physical Exam  General Appearance:    Alert, cooperative, no distress  Eyes:    PERRL, conjunctiva/corneas clear, EOM's intact       Lungs:     Clear to auscultation bilaterally, respirations unlabored  Heart:    Regular rate and rhythm  Neurologic:   Awake, alert, oriented x 3. No apparent focal neurological           defect.   MS:   Tender right lower SI and buttocks. No gross deformities. Normal s/s and strength of right LE.        Assessment & Plan:     1. DDD (degenerative disc disease), lumbar Gabapentin is effective, but prescription that Dr. Sharlet Salina wrote has run out. Will refill today and she is to call if not improving or if any new symptoms.  - gabapentin (NEURONTIN) 300 MG capsule; Take 1 capsule (300 mg total) by mouth 2 (two) times daily.  Dispense: 60 capsule; Refill: [place], MD  [facility]

## 2016-12-28 DIAGNOSIS — E119 Type 2 diabetes mellitus without complications: Secondary | ICD-10-CM | POA: Diagnosis not present

## 2017-02-02 ENCOUNTER — Ambulatory Visit: Payer: Medicare HMO

## 2017-02-09 ENCOUNTER — Other Ambulatory Visit: Payer: Self-pay | Admitting: Family Medicine

## 2017-02-26 ENCOUNTER — Ambulatory Visit: Payer: Self-pay

## 2017-02-26 ENCOUNTER — Ambulatory Visit (INDEPENDENT_AMBULATORY_CARE_PROVIDER_SITE_OTHER): Payer: Medicare HMO | Admitting: Family Medicine

## 2017-02-26 ENCOUNTER — Encounter: Payer: Self-pay | Admitting: Family Medicine

## 2017-02-26 VITALS — BP 138/80 | HR 58 | Temp 98.1°F | Resp 16 | Wt 183.0 lb

## 2017-02-26 DIAGNOSIS — I10 Essential (primary) hypertension: Secondary | ICD-10-CM

## 2017-02-26 DIAGNOSIS — M5136 Other intervertebral disc degeneration, lumbar region: Secondary | ICD-10-CM | POA: Diagnosis not present

## 2017-02-26 DIAGNOSIS — E1149 Type 2 diabetes mellitus with other diabetic neurological complication: Secondary | ICD-10-CM | POA: Diagnosis not present

## 2017-02-26 DIAGNOSIS — E2839 Other primary ovarian failure: Secondary | ICD-10-CM

## 2017-02-26 LAB — POCT GLYCOSYLATED HEMOGLOBIN (HGB A1C)
ESTIMATED AVERAGE GLUCOSE: 146
HEMOGLOBIN A1C: 6.7

## 2017-02-26 LAB — POCT UA - MICROALBUMIN: Microalbumin Ur, POC: NEGATIVE mg/L

## 2017-02-26 NOTE — Patient Instructions (Signed)

## 2017-02-26 NOTE — Progress Notes (Signed)
Patient: [name] [name] Female    DOB: [birth year]   83 y.o.   MRN: [id number] Visit Date: 02/26/2017  Today's Provider: Lelon Huh, MD   Chief Complaint  Patient presents with  . Diabetes  . Hypertension  . Hypothyroidism   Subjective:    HPI  Hypertension, follow-up:  BP Readings from Last 3 Encounters:  10/22/16 120/78  08/25/16 132/62  06/24/16 132/68    She was last seen for hypertension 8 months ago.  BP at that visit was 132/68. Management since that visit includes no changes. She reports good compliance with treatment. She is not having side effects.  She is exercising. She is adherent to low salt diet.   Outside blood pressures are not being checked at home. She is experiencing fatigue and lower extremity edema.  Patient denies chest pain, chest pressure/discomfort, claudication, dyspnea, exertional chest pressure/discomfort, irregular heart beat, near-syncope, orthopnea, palpitations, paroxysmal nocturnal dyspnea, syncope and tachypnea.   Cardiovascular risk factors include advanced age (older than 56 for men, 38 for women), diabetes mellitus and hypertension.  Use of agents associated with hypertension: NSAIDS.     Weight trend: stable Wt Readings from Last 3 Encounters:  10/22/16 182 lb (82.6 kg)  08/25/16 182 lb (82.6 kg)  06/24/16 179 lb (81.2 kg)    Current diet: in general, a "healthy" diet    ------------------------------------------------------------------------  Diabetes Mellitus Type II, Follow-up:   Lab Results  Component Value Date   HGBA1C 6.5 06/24/2016   HGBA1C 6.5 01/07/2016   HGBA1C 6.5 08/13/2015    Last seen for diabetes 8 months ago.  Management since then includes no changes. She reports good compliance with treatment. She is not having side effects.  Current symptoms include paresthesia of the feet and have been stable. Home blood sugar records: fasting range: 115-130  Episodes of hypoglycemia? yes     Current Insulin Regimen: none Most Recent Eye Exam: >1 year ago Weight trend: stable Prior visit with dietician: no Current diet: in general, a "healthy" diet   Current exercise: walking  Pertinent Labs:    Component Value Date/Time   CHOL 202 (H) 04/01/2015 0955   TRIG 105 04/01/2015 0955   HDL 41 04/01/2015 0955   LDLCALC 140 (H) 04/01/2015 0955   CREATININE 0.92 06/24/2016 0939    Wt Readings from Last 3 Encounters:  10/22/16 182 lb (82.6 kg)  08/25/16 182 lb (82.6 kg)  06/24/16 179 lb (81.2 kg)    ------------------------------------------------------------------------ Follow up of Hypothyroidism:  Patient was last seen for this problem 1 year ago and no changes were made. Patient reports good compliance with treatment and good tolerance.  Lab Results  Component Value Date   TSH 0.790 06/24/2016     Follow up of DDD:  Patient was last seen for this problem 4 months ago and no changes were made.     Allergies  Allergen Reactions  . Lovastatin     Weakness  . Pravastatin Sodium     Muscle weakness  . Simvastatin     Fatigue     Current Outpatient Prescriptions:  .  amLODipine (NORVASC) 5 MG tablet, TAKE 1 TABLET EVERY DAY, Disp: 90 tablet, Rfl: 4 .  aspirin 81 MG tablet, Take 1 tablet by mouth daily., Disp: , Rfl:  .  cholecalciferol (VITAMIN D) 1000 UNITS tablet, Take 1 tablet by mouth daily., Disp: , Rfl:  .  furosemide (LASIX) 20 MG tablet, TAKE 1 TABLET EVERY DAY, Disp:  90 tablet, Rfl: 3 .  gabapentin (NEURONTIN) 300 MG capsule, Take 1 capsule (300 mg total) by mouth 2 (two) times daily., Disp: 60 capsule, Rfl: 5 .  glipiZIDE (GLUCOTROL XL) 5 MG 24 hr tablet, Take 1 tablet (5 mg total) by mouth daily., Disp: 90 tablet, Rfl: 2 .  levothyroxine (SYNTHROID, LEVOTHROID) 75 MCG tablet, TAKE 1 TABLET EVERY DAY, Disp: 90 tablet, Rfl: 4 .  metFORMIN (GLUCOPHAGE-XR) 750 MG 24 hr tablet, Take 1 tablet (750 mg total) by mouth 2 (two) times daily., Disp: 180  tablet, Rfl: 4 .  Omega-3 Fatty Acids (FISH OIL) 1000 MG CAPS, Take 1 capsule by mouth daily., Disp: , Rfl:  .  oxybutynin (DITROPAN) 5 MG tablet, TAKE 1 TABLET  2  TO  3  TIMES  DAILY AS NEEDED  FOR  URINARY FREQUENCY, Disp: 180 tablet, Rfl: 1 .  quinapril (ACCUPRIL) 40 MG tablet, Take 2 tablets (80 mg total) by mouth daily., Disp: 180 tablet, Rfl: 4 .  vitamin B-12 (CYANOCOBALAMIN) 100 MCG tablet, Take 1 tablet by mouth daily., Disp: , Rfl:   Review of Systems  Constitutional: Negative for appetite change, chills, fatigue and fever.  Respiratory: Negative for chest tightness and shortness of breath.   Cardiovascular: Negative for chest pain and palpitations.  Gastrointestinal: Negative for abdominal pain, nausea and vomiting.  Neurological: Negative for dizziness and weakness.    Social History  Substance Use Topics  . Smoking status: Never Smoker  . Smokeless tobacco: Never Used  . Alcohol use No   Objective:   BP (!) 144/60 (BP Location: Left Arm, Patient Position: Sitting, Cuff Size: Large)   Pulse (!) 58   Temp 98.1 F (36.7 C) (Oral)   Resp 16   Wt 183 lb (83 kg)   SpO2 96% Comment: room air  BMI 31.91 kg/m  There were no vitals filed for this visit.   Physical Exam   General Appearance:    Alert, cooperative, no distress  Eyes:    PERRL, conjunctiva/corneas clear, EOM's intact       Lungs:     Clear to auscultation bilaterally, respirations unlabored  Heart:    Regular rate and rhythm  Neurologic:   Awake, alert, oriented x 3. No apparent focal neurological           defect.         Results for orders placed or performed in visit on 02/26/17  POCT HgB A1C  Result Value Ref Range   Hemoglobin A1C 6.7    Est. average glucose Bld gHb Est-mCnc 146        Assessment & Plan:     1. Diabetes mellitus type 2 with neurological manifestations ([name]) Well controlled.  Continue current medications.   - POCT HgB A1C - POCT UA - Microalbumin  2. Estrogen  deficiency  - DG Bone Density; Future  3. Essential hypertension Stable. BP up a bit today since she did not take her medications this morning  4. DDD (degenerative disc disease), lumbar        Lelon Huh, MD  [facility]

## 2017-03-31 ENCOUNTER — Other Ambulatory Visit: Payer: Self-pay | Admitting: Family Medicine

## 2017-04-02 DIAGNOSIS — E119 Type 2 diabetes mellitus without complications: Secondary | ICD-10-CM | POA: Diagnosis not present

## 2017-04-21 ENCOUNTER — Ambulatory Visit
Admission: RE | Admit: 2017-04-21 | Discharge: 2017-04-21 | Disposition: A | Discharge status: Home / Self Care | Payer: Commercial Managed Care - HMO | Source: Ambulatory Visit | Attending: Family Medicine | Admitting: Family Medicine

## 2017-04-21 DIAGNOSIS — M8588 Other specified disorders of bone density and structure, other site: Secondary | ICD-10-CM | POA: Diagnosis not present

## 2017-04-21 DIAGNOSIS — Z780 Asymptomatic menopausal state: Secondary | ICD-10-CM | POA: Diagnosis not present

## 2017-04-21 DIAGNOSIS — E2839 Other primary ovarian failure: Secondary | ICD-10-CM | POA: Diagnosis present

## 2017-04-21 DIAGNOSIS — M85652 Other cyst of bone, left thigh: Secondary | ICD-10-CM | POA: Diagnosis not present

## 2017-05-10 DIAGNOSIS — Z803 Family history of malignant neoplasm of breast: Secondary | ICD-10-CM | POA: Diagnosis not present

## 2017-05-10 DIAGNOSIS — Z1231 Encounter for screening mammogram for malignant neoplasm of breast: Secondary | ICD-10-CM | POA: Diagnosis not present

## 2017-05-10 LAB — HM MAMMOGRAPHY

## 2017-05-11 ENCOUNTER — Encounter: Payer: Self-pay | Admitting: *Deleted

## 2017-05-18 ENCOUNTER — Encounter: Payer: Self-pay | Admitting: Family Medicine

## 2017-06-01 ENCOUNTER — Other Ambulatory Visit: Payer: Self-pay | Admitting: Family Medicine

## 2017-06-28 ENCOUNTER — Ambulatory Visit: Payer: Medicare HMO | Admitting: Family Medicine

## 2017-07-06 ENCOUNTER — Ambulatory Visit (INDEPENDENT_AMBULATORY_CARE_PROVIDER_SITE_OTHER): Payer: Medicare HMO

## 2017-07-06 ENCOUNTER — Ambulatory Visit (INDEPENDENT_AMBULATORY_CARE_PROVIDER_SITE_OTHER): Payer: Medicare HMO | Admitting: Family Medicine

## 2017-07-06 ENCOUNTER — Encounter: Payer: Self-pay | Admitting: Family Medicine

## 2017-07-06 VITALS — BP 148/70

## 2017-07-06 VITALS — BP 160/58 | HR 68 | Temp 98.3°F | Ht 64.0 in | Wt 183.2 lb

## 2017-07-06 DIAGNOSIS — I10 Essential (primary) hypertension: Secondary | ICD-10-CM | POA: Diagnosis not present

## 2017-07-06 DIAGNOSIS — E538 Deficiency of other specified B group vitamins: Secondary | ICD-10-CM

## 2017-07-06 DIAGNOSIS — M5136 Other intervertebral disc degeneration, lumbar region: Secondary | ICD-10-CM

## 2017-07-06 DIAGNOSIS — Z0000 Encounter for general adult medical examination without abnormal findings: Secondary | ICD-10-CM | POA: Diagnosis not present

## 2017-07-06 DIAGNOSIS — E039 Hypothyroidism, unspecified: Secondary | ICD-10-CM | POA: Diagnosis not present

## 2017-07-06 DIAGNOSIS — E1149 Type 2 diabetes mellitus with other diabetic neurological complication: Secondary | ICD-10-CM | POA: Diagnosis not present

## 2017-07-06 LAB — POCT GLYCOSYLATED HEMOGLOBIN (HGB A1C)
Est. average glucose Bld gHb Est-mCnc: 134
Hemoglobin A1C: 6.3

## 2017-07-06 MED ORDER — GLIPIZIDE ER 10 MG PO TB24: 10.0000 mg | ORAL_TABLET | Freq: Every day | ORAL | 4 refills | Status: DC

## 2017-07-06 MED ORDER — GABAPENTIN 300 MG PO CAPS: 300.0000 mg | ORAL_CAPSULE | Freq: Two times a day (BID) | ORAL | 3 refills | Status: DC

## 2017-07-06 NOTE — Progress Notes (Signed)
Subjective:   [name] [name] is a 83 y.o. female who presents for Medicare Annual (Subsequent) preventive examination.  Review of Systems:  N/A   Cardiac Risk Factors include: advanced age (>46men, >74 women);diabetes mellitus;dyslipidemia;hypertension;obesity (BMI >30kg/m2)     Objective:     Vitals: BP (!) 160/58 (BP Location: Left Arm)   Pulse 68   Temp 98.3 F (36.8 C) (Oral)   Ht 5\' 4"  (1.626 m)   Wt 183 lb 3.2 oz (83.1 kg)   BMI 31.45 kg/m   Body mass index is 31.45 kg/m.   Tobacco History  Smoking Status  . Never Smoker  Smokeless Tobacco  . Never Used     Counseling given: Not Answered   Past Medical History:  Diagnosis Date  . Allergy   . Arthritis   . Diabetes mellitus without complication ([place])   . Hyperlipidemia   . Hypertension   . Thyroid disease    Past Surgical History:  Procedure Laterality Date  . ABDOMINAL HYSTERECTOMY    . THYROIDECTOMY, PARTIAL  1994  . TONSILLECTOMY     History reviewed. No pertinent family history. History  Sexual Activity  . Sexual activity: Not on file    Outpatient Encounter Prescriptions as of 07/06/2017  Medication Sig  . amLODipine (NORVASC) 5 MG tablet TAKE 1 TABLET EVERY DAY  . aspirin 81 MG tablet Take 1 tablet by mouth daily.  . cholecalciferol (VITAMIN D) 1000 UNITS tablet Take 1 tablet by mouth daily.  . furosemide (LASIX) 20 MG tablet TAKE 1 TABLET EVERY DAY  . gabapentin (NEURONTIN) 300 MG capsule Take 1 capsule (300 mg total) by mouth 2 (two) times daily.  Marland Kitchen glipiZIDE (GLUCOTROL XL) 5 MG 24 hr tablet TAKE 1 TABLET EVERY DAY  . levothyroxine (SYNTHROID, LEVOTHROID) 75 MCG tablet TAKE 1 TABLET EVERY DAY  . metFORMIN (GLUCOPHAGE-XR) 750 MG 24 hr tablet Take 1 tablet (750 mg total) by mouth 2 (two) times daily.  . Omega-3 Fatty Acids (FISH OIL) 1000 MG CAPS Take 1 capsule by mouth daily.  Marland Kitchen oxybutynin (DITROPAN) 5 MG tablet TAKE 1 TABLET  2  TO  3  TIMES  DAILY AS NEEDED  FOR  URINARY FREQUENCY    . quinapril (ACCUPRIL) 40 MG tablet Take 2 tablets (80 mg total) by mouth daily.  . vitamin B-12 (CYANOCOBALAMIN) 100 MCG tablet Take 1 tablet by mouth daily.   No facility-administered encounter medications on file as of 07/06/2017.     Activities of Daily Living In your present state of health, do you have any difficulty performing the following activities: 07/06/2017 08/25/2016  Hearing? N N  Vision? Y Y  Comment trouble seeing due to cataracts -  Difficulty concentrating or making decisions? N N  Walking or climbing stairs? Y N  Comment due to pains -  Dressing or bathing? N N  Doing errands, shopping? [place] Donning  Preparing Food and eating ? N -  Using the Toilet? N -  In the past six months, have you accidently leaked urine? Y -  Comment wears depends -  Do you have problems with loss of bowel control? N -  Managing your Medications? N -  Managing your Finances? Y -  Housekeeping or managing your Housekeeping? N -  Some recent data might be hidden    Patient Care Team: Birdie Sons, MD as PCP - General (Family Medicine)    Assessment:     Exercise Activities and Dietary recommendations Current Exercise Habits: The  patient does not participate in regular exercise at present, Exercise limited by: orthopedic condition(s)  Goals    . Increase water intake          Continue drinking 8 glasses of water a day.       Fall Risk Fall Risk  07/06/2017 02/26/2017 08/25/2016 08/13/2015  Falls in the past year? No No No No  Risk for fall due to : - Impaired balance/gait - -   Depression Screen PHQ 2/9 Scores 07/06/2017 08/25/2016 08/13/2015  PHQ - 2 Score 0 0 0  PHQ- 9 Score - - 1     Cognitive Function: Pt declined screening today.     6CIT Screen 08/25/2016  What Year? 0 points  What month? 0 points  What time? 0 points  Count back from 20 0 points  Months in reverse 0 points  Repeat phrase 4 points  Total Score 4    Immunization History  Administered  Date(s) Administered  . Influenza, High Dose Seasonal PF 05/27/2015, 06/24/2016  . Pneumococcal Conjugate-13 08/07/2014  . Pneumococcal Polysaccharide-23 12/31/2000  . Td 02/19/1998  . Tdap 06/20/2011  . Zoster 12/13/2007   Screening Tests Health Maintenance  Topic Date Due  . FOOT EXAM  10/25/1944  . OPHTHALMOLOGY EXAM  10/25/1944  . INFLUENZA VACCINE  04/14/2017  . HEMOGLOBIN A1C  08/28/2017  . MAMMOGRAM  05/10/2018  . DEXA SCAN  04/21/2020  . TETANUS/TDAP  06/19/2021  . PNA vac Low Risk Adult  Completed      Plan:  I have personally reviewed and addressed the Medicare Annual Wellness questionnaire and have noted the following in the patient's chart:  A. Medical and social history B. Use of alcohol, tobacco or illicit drugs  C. Current medications and supplements D. Functional ability and status E.  Nutritional status F.  Physical activity G. Advance directives H. List of other physicians I.  Hospitalizations, surgeries, and ER visits in previous 12 months J.  [place] such as hearing and vision if needed, cognitive and depression L. Referrals and appointments - none  In addition, I have reviewed and discussed with patient certain preventive protocols, quality metrics, and best practice recommendations. A written personalized care plan for preventive services as well as general preventive health recommendations were provided to patient.  See attached scanned questionnaire for additional information.   Signed,  Fabio Neighbors, LPN Nurse Health Advisor   MD Recommendations: Pt needs a diabetic foot exam today. Pt declined the influenza vaccine today due to having a cough. Pt to set up and eye exam this year.

## 2017-07-06 NOTE — Progress Notes (Signed)
Patient: [name] [name] Female    DOB: April 02, 1933   82 y.o.   MRN: [id number] Visit Date: 07/06/2017  Today's Provider: Lelon Huh, MD   Chief Complaint  Patient presents with  . Hypertension    follow up  . Diabetes    follow up  . Hypothyroidism    follow up   Subjective:    HPI   Diabetes Mellitus Type II, Follow-up:   Lab Results  Component Value Date   HGBA1C 6.7 02/26/2017   HGBA1C 6.5 06/24/2016   HGBA1C 6.5 01/07/2016   Last seen for diabetes 4 months ago.  Management since then includes; no changes. She reports good compliance with treatment. Patient states has been taking 2 tablets of Glipizide 5mg  daily due to blood sugars going up into the 140s after we previously reduced dose from 10 to 5mg . States she feels better with lower blood sugars.  She is not having side effects.  Current symptoms include paresthesia of the feet and have been stable. Home blood sugar records: fasting range: under 100  Episodes of hypoglycemia? no   Current Insulin Regimen: none Most Recent Eye Exam: 1 year ago Weight trend: stable Prior visit with dietician: no Current diet: well balanced Current exercise: none  ------------------------------------------------------------------------   Hypertension, follow-up:  BP Readings from Last 3 Encounters:  02/26/17 138/80  10/22/16 120/78  08/25/16 132/62    She was last seen for hypertension 4 months ago.  BP at that visit was 144/60. Management since that visit includes; no changes.She reports good compliance with treatment. She is not having side effects.  She is not exercising. She is adherent to low salt diet.   Outside blood pressures are not being checked. She is experiencing lower extremity edema.  Patient denies chest pain, chest pressure/discomfort, claudication, dyspnea, exertional chest pressure/discomfort, irregular heart beat, near-syncope, orthopnea, palpitations, paroxysmal nocturnal dyspnea,  syncope and tachypnea.   Cardiovascular risk factors include advanced age (older than 17 for men, 80 for women), diabetes mellitus and hypertension.  Use of agents associated with hypertension: NSAIDS.   ------------------------------------------------------------------------   Hypothyroid From 1 year ago-no changes. Patient reports good compliance with treatment.  Lab Results  Component Value Date   TSH 0.790 06/24/2016     Allergies  Allergen Reactions  . Lovastatin     Weakness  . Pravastatin Sodium     Muscle weakness  . Simvastatin     Fatigue     Current Outpatient Prescriptions:  .  amLODipine (NORVASC) 5 MG tablet, TAKE 1 TABLET EVERY DAY, Disp: 90 tablet, Rfl: 4 .  aspirin 81 MG tablet, Take 1 tablet by mouth daily., Disp: , Rfl:  .  cholecalciferol (VITAMIN D) 1000 UNITS tablet, Take 1 tablet by mouth daily., Disp: , Rfl:  .  furosemide (LASIX) 20 MG tablet, TAKE 1 TABLET EVERY DAY, Disp: 90 tablet, Rfl: 2 .  gabapentin (NEURONTIN) 300 MG capsule, Take 1 capsule (300 mg total) by mouth 2 (two) times daily., Disp: 60 capsule, Rfl: 5 .  glipiZIDE (GLUCOTROL XL) 5 MG 24 hr tablet, TAKE 1 TABLET EVERY DAY, Disp: 90 tablet, Rfl: 2 .  levothyroxine (SYNTHROID, LEVOTHROID) 75 MCG tablet, TAKE 1 TABLET EVERY DAY, Disp: 90 tablet, Rfl: 4 .  metFORMIN (GLUCOPHAGE-XR) 750 MG 24 hr tablet, Take 1 tablet (750 mg total) by mouth 2 (two) times daily., Disp: 180 tablet, Rfl: 4 .  Omega-3 Fatty Acids (FISH OIL) 1000 MG CAPS, Take 1 capsule by  mouth daily., Disp: , Rfl:  .  oxybutynin (DITROPAN) 5 MG tablet, TAKE 1 TABLET  2  TO  3  TIMES  DAILY AS NEEDED  FOR  URINARY FREQUENCY, Disp: 180 tablet, Rfl: 1 .  quinapril (ACCUPRIL) 40 MG tablet, Take 2 tablets (80 mg total) by mouth daily., Disp: 180 tablet, Rfl: 4 .  vitamin B-12 (CYANOCOBALAMIN) 100 MCG tablet, Take 1 tablet by mouth daily., Disp: , Rfl:   Review of Systems  Constitutional: Negative for appetite change, chills, fatigue  and fever.  HENT: Positive for voice change.   Respiratory: Positive for cough. Negative for chest tightness and shortness of breath.   Cardiovascular: Negative for chest pain and palpitations.  Gastrointestinal: Negative for abdominal pain, nausea and vomiting.  Neurological: Positive for numbness (in feet ). Negative for dizziness and weakness.    Social History  Substance Use Topics  . Smoking status: Never Smoker  . Smokeless tobacco: Never Used  . Alcohol use No   Objective:   BP (!) 148/70 (BP Location: Left Arm, Patient Position: Sitting, Cuff Size: Large)  There were no vitals filed for this visit. Vitals: BP (!) 160/58 (BP Location: Left Arm)   Pulse 68   Temp 98.3 F (36.8 C) (Oral)   Ht 5\' 4"  (1.626 m)   Wt 183 lb 3.2 oz (83.1 kg)   BMI 31.45 kg/m   Body mass index is 31.45 kg/m.  Physical Exam   General Appearance:    Alert, cooperative, no distress  Eyes:    PERRL, conjunctiva/corneas clear, EOM's intact       Lungs:     Clear to auscultation bilaterally, respirations unlabored  Heart:    Regular rate and rhythm  Neurologic:   Awake, alert, oriented x 3. No apparent focal neurological           defect.       Results for orders placed or performed in visit on 07/06/17  POCT glycosylated hemoglobin (Hb A1C)  Result Value Ref Range   Hemoglobin A1C 6.3    Est. average glucose Bld gHb Est-mCnc 134        Assessment & Plan:     .1. Diabetes mellitus type 2 with neurological manifestations ([place]) Well controlled.  Continue current medications.  Change from 2x5mg  glipizide daily to 1x10mg  daily.  - POCT glycosylated hemoglobin (Hb A1C)  2. Essential hypertension Well controlled.  Continue current medications.   - COMPLETE METABOLIC PANEL WITH GFR  3. Hypothyroidism, unspecified type  - TSH  4. B12 deficiency  - Vitamin B12  5. DDD (degenerative disc disease), lumbar Requests refill gabapentin.  - gabapentin (NEURONTIN) 300 MG capsule; Take 1  capsule (300 mg total) by mouth 2 (two) times daily.  Dispense: 180 capsule; Refill: 3  She declined flu vaccine today since has had had some nasal congestion and drainage, but plans on coming back to flu clinic to receive injection.       Lelon Huh, MD  [facility]

## 2017-07-06 NOTE — Patient Instructions (Signed)
[name] [name] , Thank you for taking time to come for your Medicare Wellness Visit. I appreciate your ongoing commitment to your health goals. Please review the following plan we discussed and let me know if I can assist you in the future.   Screening recommendations/referrals: Colonoscopy: no longer required Mammogram: Up to date Bone Density: Up to date Recommended yearly ophthalmology/optometry visit for glaucoma screening and checkup Recommended yearly dental visit for hygiene and checkup  Vaccinations: Influenza vaccine: declined Pneumococcal vaccine: completed series Tdap vaccine: Up to date Shingles vaccine: completed in 2009  Advanced directives: Advance directive discussed with you today. Even though you declined this today please call our office should you change your mind and we can give you the proper paperwork for you to fill out.  Conditions/risks identified: Obesity; Continue drinking 8 glasses of water a day.   Next appointment: 10:00 AM today   Preventive Care 65 Years and Older, Female Preventive care refers to lifestyle choices and visits with your health care provider that can promote health and wellness. What does preventive care include?  A yearly physical exam. This is also called an annual well check.  Dental exams once or twice a year.  Routine eye exams. Ask your health care provider how often you should have your eyes checked.  Personal lifestyle choices, including:  Daily care of your teeth and gums.  Regular physical activity.  Eating a healthy diet.  Avoiding tobacco and drug use.  Limiting alcohol use.  Practicing safe sex.  Taking low-dose aspirin every day.  Taking vitamin and mineral supplements as recommended by your health care provider. What happens during an annual well check? The services and screenings done by your health care provider during your annual well check will depend on your age, overall health, lifestyle risk factors,  and family history of disease. Counseling  Your health care provider may ask you questions about your:  Alcohol use.  Tobacco use.  Drug use.  Emotional well-being.  Home and relationship well-being.  Sexual activity.  Eating habits.  History of falls.  Memory and ability to understand (cognition).  Work and work Statistician.  Reproductive health. Screening  You may have the following tests or measurements:  Height, weight, and BMI.  Blood pressure.  Lipid and cholesterol levels. These may be checked every 5 years, or more frequently if you are over 76 years old.  Skin check.  Lung cancer screening. You may have this screening every year starting at age 60 if you have a 30-pack-year history of smoking and currently smoke or have quit within the past 15 years.  Fecal occult blood test (FOBT) of the stool. You may have this test every year starting at age 56.  Flexible sigmoidoscopy or colonoscopy. You may have a sigmoidoscopy every 5 years or a colonoscopy every 10 years starting at age 42.  Hepatitis C blood test.  Hepatitis B blood test.  Sexually transmitted disease (STD) testing.  Diabetes screening. This is done by checking your blood sugar (glucose) after you have not eaten for a while (fasting). You may have this done every 1-3 years.  Bone density scan. This is done to screen for osteoporosis. You may have this done starting at age 9.  Mammogram. This may be done every 1-2 years. Talk to your health care provider about how often you should have regular mammograms. Talk with your health care provider about your test results, treatment options, and if necessary, the need for more tests. Vaccines  Your health care provider may recommend certain vaccines, such as:  Influenza vaccine. This is recommended every year.  Tetanus, diphtheria, and acellular pertussis (Tdap, Td) vaccine. You may need a Td booster every 10 years.  Zoster vaccine. You may need  this after age 33.  Pneumococcal 13-valent conjugate (PCV13) vaccine. One dose is recommended after age 32.  Pneumococcal polysaccharide (PPSV23) vaccine. One dose is recommended after age 31. Talk to your health care provider about which screenings and vaccines you need and how often you need them. This information is not intended to replace advice given to you by your health care provider. Make sure you discuss any questions you have with your health care provider. Document Released: 09/27/2015 Document Revised: 05/20/2016 Document Reviewed: 07/02/2015 Elsevier Interactive Patient Education  2017 [place] Prevention in the Home Falls can cause injuries. They can happen to people of all ages. There are many things you can do to make your home safe and to help prevent falls. What can I do on the outside of my home?  Regularly fix the edges of walkways and driveways and fix any cracks.  Remove anything that might make you trip as you walk through a door, such as a raised step or threshold.  Trim any bushes or trees on the path to your home.  Use bright outdoor lighting.  Clear any walking paths of anything that might make someone trip, such as rocks or tools.  Regularly check to see if handrails are loose or broken. Make sure that both sides of any steps have handrails.  Any raised decks and porches should have guardrails on the edges.  Have any leaves, snow, or ice cleared regularly.  Use sand or salt on walking paths during winter.  Clean up any spills in your garage right away. This includes oil or grease spills. What can I do in the bathroom?  Use night lights.  Install grab bars by the toilet and in the tub and shower. Do not use towel bars as grab bars.  Use non-skid mats or decals in the tub or shower.  If you need to sit down in the shower, use a plastic, non-slip stool.  Keep the floor dry. Clean up any water that spills on the floor as soon as it  happens.  Remove soap buildup in the tub or shower regularly.  Attach bath mats securely with double-sided non-slip rug tape.  Do not have throw rugs and other things on the floor that can make you trip. What can I do in the bedroom?  Use night lights.  Make sure that you have a light by your bed that is easy to reach.  Do not use any sheets or blankets that are too big for your bed. They should not hang down onto the floor.  Have a firm chair that has side arms. You can use this for support while you get dressed.  Do not have throw rugs and other things on the floor that can make you trip. What can I do in the kitchen?  Clean up any spills right away.  Avoid walking on wet floors.  Keep items that you use a lot in easy-to-reach places.  If you need to reach something above you, use a strong step stool that has a grab bar.  Keep electrical cords out of the way.  Do not use floor polish or wax that makes floors slippery. If you must use wax, use non-skid floor wax.  Do  not have throw rugs and other things on the floor that can make you trip. What can I do with my stairs?  Do not leave any items on the stairs.  Make sure that there are handrails on both sides of the stairs and use them. Fix handrails that are broken or loose. Make sure that handrails are as long as the stairways.  Check any carpeting to make sure that it is firmly attached to the stairs. Fix any carpet that is loose or worn.  Avoid having throw rugs at the top or bottom of the stairs. If you do have throw rugs, attach them to the floor with carpet tape.  Make sure that you have a light switch at the top of the stairs and the bottom of the stairs. If you do not have them, ask someone to add them for you. What else can I do to help prevent falls?  Wear shoes that:  Do not have high heels.  Have rubber bottoms.  Are comfortable and fit you well.  Are closed at the toe. Do not wear sandals.  If you  use a stepladder:  Make sure that it is fully opened. Do not climb a closed stepladder.  Make sure that both sides of the stepladder are locked into place.  Ask someone to hold it for you, if possible.  Clearly mark and make sure that you can see:  Any grab bars or handrails.  First and last steps.  Where the edge of each step is.  Use tools that help you move around (mobility aids) if they are needed. These include:  Canes.  Walkers.  Scooters.  Crutches.  Turn on the lights when you go into a dark area. Replace any light bulbs as soon as they burn out.  Set up your furniture so you have a clear path. Avoid moving your furniture around.  If any of your floors are uneven, fix them.  If there are any pets around you, be aware of where they are.  Review your medicines with your doctor. Some medicines can make you feel dizzy. This can increase your chance of falling. Ask your doctor what other things that you can do to help prevent falls. This information is not intended to replace advice given to you by your health care provider. Make sure you discuss any questions you have with your health care provider. Document Released: 06/27/2009 Document Revised: 02/06/2016 Document Reviewed: 10/05/2014 Elsevier Interactive Patient Education  2017 [employer].

## 2017-07-07 LAB — COMPLETE METABOLIC PANEL WITH GFR
AG RATIO: 1.4 (calc) (ref 1.0–2.5)
ALBUMIN MSPROF: 4.5 g/dL (ref 3.6–5.1)
ALT: 18 U/L (ref 6–29)
AST: 19 U/L (ref 10–35)
Alkaline phosphatase (APISO): 60 U/L (ref 33–130)
BUN / CREAT RATIO: 14 (calc) (ref 6–22)
BUN: 13 mg/dL (ref 7–25)
CALCIUM: 9.8 mg/dL (ref 8.6–10.4)
CO2: 31 mmol/L (ref 20–32)
CREATININE: 0.9 mg/dL — AB (ref 0.60–0.88)
Chloride: 101 mmol/L (ref 98–110)
GFR, EST AFRICAN AMERICAN: 69 mL/min/{1.73_m2} (ref >=60)
GFR, EST NON AFRICAN AMERICAN: 60 mL/min/{1.73_m2} (ref >=60)
GLOBULIN: 3.2 g/dL (ref 1.9–3.7)
Glucose, Bld: 83 mg/dL (ref 65–99)
Potassium: 4 mmol/L (ref 3.5–5.3)
SODIUM: 141 mmol/L (ref 135–146)
TOTAL PROTEIN: 7.7 g/dL (ref 6.1–8.1)
Total Bilirubin: 0.6 mg/dL (ref 0.2–1.2)

## 2017-07-07 LAB — VITAMIN B12: Vitamin B-12: 1103 pg/mL — ABNORMAL HIGH (ref 200–1100)

## 2017-07-07 LAB — TSH: TSH: 0.74 mIU/L (ref 0.40–4.50)

## 2017-07-15 ENCOUNTER — Ambulatory Visit (INDEPENDENT_AMBULATORY_CARE_PROVIDER_SITE_OTHER): Payer: Medicare HMO

## 2017-07-15 DIAGNOSIS — Z23 Encounter for immunization: Secondary | ICD-10-CM

## 2017-07-17 ENCOUNTER — Ambulatory Visit: Payer: Self-pay

## 2017-07-20 ENCOUNTER — Telehealth: Payer: Self-pay

## 2017-07-20 NOTE — Telephone Encounter (Signed)
LMTCB and r/s previous n/s apt for AWV with [facility]. Will try to r/s around next apt with PCP in 10/2017.

## 2017-08-17 DIAGNOSIS — E119 Type 2 diabetes mellitus without complications: Secondary | ICD-10-CM | POA: Diagnosis not present

## 2017-09-21 ENCOUNTER — Other Ambulatory Visit: Payer: Self-pay | Admitting: Family Medicine

## 2017-11-02 ENCOUNTER — Other Ambulatory Visit: Payer: Self-pay | Admitting: Family Medicine

## 2017-11-08 ENCOUNTER — Ambulatory Visit (INDEPENDENT_AMBULATORY_CARE_PROVIDER_SITE_OTHER): Payer: Medicare HMO

## 2017-11-08 ENCOUNTER — Ambulatory Visit (INDEPENDENT_AMBULATORY_CARE_PROVIDER_SITE_OTHER): Payer: Medicare HMO | Admitting: Family Medicine

## 2017-11-08 VITALS — BP 134/64 | HR 76 | Temp 99.3°F | Ht 64.0 in | Wt 176.6 lb

## 2017-11-08 DIAGNOSIS — E1149 Type 2 diabetes mellitus with other diabetic neurological complication: Secondary | ICD-10-CM | POA: Diagnosis not present

## 2017-11-08 DIAGNOSIS — Z0000 Encounter for general adult medical examination without abnormal findings: Secondary | ICD-10-CM

## 2017-11-08 DIAGNOSIS — I10 Essential (primary) hypertension: Secondary | ICD-10-CM

## 2017-11-08 DIAGNOSIS — J301 Allergic rhinitis due to pollen: Secondary | ICD-10-CM

## 2017-11-08 LAB — POCT GLYCOSYLATED HEMOGLOBIN (HGB A1C)
ESTIMATED AVERAGE GLUCOSE: 134
HEMOGLOBIN A1C: 6.3

## 2017-11-08 MED ORDER — FLUTICASONE PROPIONATE 50 MCG/ACT NA SUSP: 2.0000 | Freq: Every day | NASAL | 3 refills | Status: DC

## 2017-11-08 NOTE — Progress Notes (Signed)
Lab Results  Component Value Date   HGBA1C 6.3 11/08/2017       Patient: [name] [name] Female    DOB: 27-Feb-1933   84 y.o.   MRN: [id number] Visit Date: 11/08/2017  Today's Provider: Lelon Huh, MD   Chief Complaint  Patient presents with  . Hypertension  . Diabetes  . Hypothyroidism   Subjective:    HPI  Hypertension, follow-up:  BP Readings from Last 3 Encounters:  11/08/17 134/64  07/06/17 (!) 148/70  07/06/17 (!) 160/58    She was last seen for hypertension 4 months ago.  BP at that visit was 148/70. Management since that visit includes no changes. She reports good compliance with treatment. She is not having side effects.  She is not exercising. She is adherent to low salt diet.   Outside blood pressures are not being checked. She is experiencing lower extremity edema.  Patient denies chest pain, chest pressure/discomfort, claudication, dyspnea, exertional chest pressure/discomfort, fatigue, irregular heart beat, near-syncope, orthopnea, palpitations, paroxysmal nocturnal dyspnea, syncope and tachypnea.   Cardiovascular risk factors include advanced age (older than 95 for men, 47 for women), diabetes mellitus and hypertension.  Use of agents associated with hypertension: thyroid hormones.     Weight trend: fluctuating a bit Wt Readings from Last 3 Encounters:  11/08/17 176 lb 9.6 oz (80.1 kg)  07/06/17 183 lb 3.2 oz (83.1 kg)  02/26/17 183 lb (83 kg)    Current diet: well balanced  ------------------------------------------------------------------------  Diabetes Mellitus Type II, Follow-up:   Lab Results  Component Value Date   HGBA1C 6.3 07/06/2017   HGBA1C 6.7 02/26/2017   HGBA1C 6.5 06/24/2016    Last seen for diabetes 4 months ago.  Management since then includes no changes. She reports good compliance with treatment. She is not having side effects.  Current symptoms include none and have been stable. Home blood sugar records:  fasting range: low 100's  Episodes of hypoglycemia? no   Current Insulin Regimen: none Most Recent Eye Exam: >1 year ago Weight trend: fluctuating a bit Prior visit with dietician: no Current diet: well balanced Current exercise: none  Pertinent Labs:    Component Value Date/Time   CHOL 202 (H) 04/01/2015 0955   TRIG 105 04/01/2015 0955   HDL 41 04/01/2015 0955   LDLCALC 140 (H) 04/01/2015 0955   CREATININE 0.90 (H) 07/06/2017 1049    Wt Readings from Last 3 Encounters:  11/08/17 176 lb 9.6 oz (80.1 kg)  07/06/17 183 lb 3.2 oz (83.1 kg)  02/26/17 183 lb (83 kg)    ------------------------------------------------------------------------ Follow up of Hypothyroidism:  Patient was last seen for this problem 4 months ago and no changes were made. Patient reports good compliance with treatment, and good tolerance.  Lab Results  Component Value Date   TSH 0.74 07/06/2017     Follow up of Vitamin B12 Deficiency:  Patient last seen for this problem 4 months ago and no changes were made.   Lab Results  Component Value Date   VITAMINB12 1,103 (H) 07/06/2017    Allergies. States she has started having persistently watery nasal drainage the last couple of week. Not taking any OTC allergy medications. Is having some nasal congestion, but no sinus pain, no fever.     Allergies  Allergen Reactions  . Lovastatin     Weakness  . Pravastatin Sodium     Muscle weakness  . Simvastatin     Fatigue     Current Outpatient Medications:  .  amLODipine (NORVASC) 5 MG tablet, TAKE 1 TABLET EVERY DAY, Disp: 90 tablet, Rfl: 4 .  aspirin 81 MG tablet, Take 1 tablet by mouth daily., Disp: , Rfl:  .  cholecalciferol (VITAMIN D) 1000 UNITS tablet, Take 1 tablet by mouth daily., Disp: , Rfl:  .  furosemide (LASIX) 20 MG tablet, TAKE 1 TABLET EVERY DAY, Disp: 90 tablet, Rfl: 4 .  gabapentin (NEURONTIN) 300 MG capsule, Take 1 capsule (300 mg total) by mouth 2 (two) times daily. (Patient  taking differently: Take 300 mg by mouth 2 (two) times daily. ), Disp: 180 capsule, Rfl: 3 .  glipiZIDE (GLUCOTROL XL) 10 MG 24 hr tablet, Take 1 tablet (10 mg total) by mouth daily., Disp: 90 tablet, Rfl: 4 .  levothyroxine (SYNTHROID, LEVOTHROID) 75 MCG tablet, TAKE 1 TABLET EVERY DAY, Disp: 90 tablet, Rfl: 4 .  metFORMIN (GLUCOPHAGE-XR) 750 MG 24 hr tablet, TAKE 1 TABLET TWICE DAILY, Disp: 180 tablet, Rfl: 4 .  Omega-3 Fatty Acids (FISH OIL) 1000 MG CAPS, Take 1 capsule by mouth daily., Disp: , Rfl:  .  oxybutynin (DITROPAN) 5 MG tablet, TAKE 1 TABLET  2  TO  3  TIMES  DAILY AS NEEDED  FOR  URINARY FREQUENCY, Disp: 180 tablet, Rfl: 4 .  quinapril (ACCUPRIL) 40 MG tablet, TAKE 2 TABLETS EVERY DAY, Disp: 180 tablet, Rfl: 4 .  vitamin B-12 (CYANOCOBALAMIN) 100 MCG tablet, Take 1,000 mcg by mouth daily. , Disp: , Rfl:   Review of Systems  Constitutional: Positive for activity change and appetite change. Negative for chills, fatigue and fever.  HENT: Positive for postnasal drip and rhinorrhea. Negative for congestion, ear pain, sneezing and sore throat.   Eyes: Negative.  Negative for pain and redness.  Respiratory: Negative for cough, shortness of breath and wheezing.   Cardiovascular: Positive for leg swelling (ankles). Negative for chest pain.  Gastrointestinal: Positive for constipation. Negative for abdominal pain, blood in stool, diarrhea and nausea.  Endocrine: Negative for polydipsia and polyphagia.  Genitourinary: Negative.  Negative for dysuria, flank pain, hematuria, pelvic pain, vaginal bleeding and vaginal discharge.  Musculoskeletal: Positive for arthralgias and back pain. Negative for gait problem and joint swelling.  Skin: Negative for rash.  Neurological: Positive for dizziness. Negative for tremors, seizures, weakness, light-headedness, numbness and headaches.  Hematological: Negative for adenopathy.  Psychiatric/Behavioral: Positive for agitation. Negative for behavioral  problems, confusion and dysphoric mood. The patient is not nervous/anxious and is not hyperactive.     Social History   Tobacco Use  . Smoking status: Never Smoker  . Smokeless tobacco: Never Used  Substance Use Topics  . Alcohol use: No    Alcohol/week: 0.0 oz   Objective:       Most recent update: 11/08/2017 10:34 AM by Fabio Neighbors, LPN  BP  035/00 (BP Location: Left Arm)     Pulse  76     Temp  99.3 F (37.4 C) (Oral)     Ht  5\' 4"  (1.626 m)     Wt  176 lb 9.6 oz (80.1 kg)      BMI  30.31 kg/m       Physical Exam  General Appearance:    Alert, cooperative, no distress  HENT:   bilateral TM normal without fluid or infection, neck without nodes, neck has bilateral anterior cervical nodes enlarged, pharynx erythematous without exudate, post nasal drip noted and nasal mucosa pale and congested  Eyes:    PERRL, conjunctiva/corneas clear, EOM's intact  Lungs:     Clear to auscultation bilaterally, respirations unlabored  Heart:    Regular rate and rhythm, trace bipedal and ankle edema.   Neurologic:   Awake, alert, oriented x 3. No apparent focal neurological           defect.           Assessment & Plan:     1. Diabetes mellitus type 2 with neurological manifestations ([place]) Well controlled.  Continue current medications.   - POCT HgB A1C  2. Non-seasonal allergic rhinitis due to pollen try- fluticasone (FLONASE) 50 MCG/ACT nasal spray; Place 2 sprays into both nostrils daily.  Dispense: 16 g; Refill: 3  3. Essential hypertension Well controlled.  Continue current medications.    Return in about 5 months (around 04/07/2018).        Lelon Huh, MD  [facility]

## 2017-11-08 NOTE — Progress Notes (Signed)
Subjective:   [name] [name] is a 84 y.o. female who presents for Medicare Annual (Subsequent) preventive examination.  Review of Systems:  N/A Cardiac Risk Factors include: advanced age (>57men, >31 women);diabetes mellitus;dyslipidemia;hypertension;obesity (BMI >30kg/m2)     Objective:     Vitals: BP 134/64 (BP Location: Left Arm)   Pulse 76   Temp 99.3 F (37.4 C) (Oral)   Ht 5\' 4"  (1.626 m)   Wt 176 lb 9.6 oz (80.1 kg)   BMI 30.31 kg/m   Body mass index is 30.31 kg/m.  Advanced Directives 11/08/2017 07/06/2017 05/27/2015  Does Patient Have a Medical Advance Directive? No No No  Would patient like information on creating a medical advance directive? Yes (MAU/Ambulatory/Procedural Areas - Information given) - -    Tobacco Social History   Tobacco Use  Smoking Status Never Smoker  Smokeless Tobacco Never Used     Counseling given: Not Answered   Clinical Intake:  Pre-visit preparation completed: Yes  Pain : No/denies pain Pain Score: 0-No pain     Nutritional Status: BMI > 30  Obese Nutritional Risks: None Diabetes: Yes(type 2) CBG done?: No Did pt. bring in CBG monitor from home?: No  How often do you need to have someone help you when you read instructions, pamphlets, or other written materials from your doctor or pharmacy?: 1 - Never  Interpreter Needed?: No  Information entered by :: [facility], LPN  Past Medical History:  Diagnosis Date  . Allergy   . Arthritis   . Cancer ([place])    thryoid  . Diabetes mellitus without complication ([place])   . Hyperlipidemia   . Hypertension   . Thyroid disease    Past Surgical History:  Procedure Laterality Date  . ABDOMINAL HYSTERECTOMY    . THYROIDECTOMY, PARTIAL  1994  . TONSILLECTOMY     Family History  Problem Relation Age of Onset  . Cancer Daughter        breast  . Diabetes Son    Social History   Socioeconomic History  . Marital status: Married    Spouse name: None  . Number of  children: 3  . Years of education: None  . Highest education level: 10th grade  Social Needs  . Financial resource strain: Not hard at all  . Food insecurity - worry: Never true  . Food insecurity - inability: Never true  . Transportation needs - medical: No  . Transportation needs - non-medical: No  Occupational History  . Occupation: Retired   Tobacco Use  . Smoking status: Never Smoker  . Smokeless tobacco: Never Used  Substance and Sexual Activity  . Alcohol use: No    Alcohol/week: 0.0 oz  . Drug use: No  . Sexual activity: None  Other Topics Concern  . None  Social History Narrative  . None    Outpatient Encounter Medications as of 11/08/2017  Medication Sig  . amLODipine (NORVASC) 5 MG tablet TAKE 1 TABLET EVERY DAY  . aspirin 81 MG tablet Take 1 tablet by mouth daily.  . cholecalciferol (VITAMIN D) 1000 UNITS tablet Take 1 tablet by mouth daily.  . furosemide (LASIX) 20 MG tablet TAKE 1 TABLET EVERY DAY  . gabapentin (NEURONTIN) 300 MG capsule Take 1 capsule (300 mg total) by mouth 2 (two) times daily. (Patient taking differently: Take 300 mg by mouth 2 (two) times daily. )  . glipiZIDE (GLUCOTROL XL) 10 MG 24 hr tablet Take 1 tablet (10 mg total) by mouth daily.  Marland Kitchen  levothyroxine (SYNTHROID, LEVOTHROID) 75 MCG tablet TAKE 1 TABLET EVERY DAY  . metFORMIN (GLUCOPHAGE-XR) 750 MG 24 hr tablet TAKE 1 TABLET TWICE DAILY  . Omega-3 Fatty Acids (FISH OIL) 1000 MG CAPS Take 1 capsule by mouth daily.  Marland Kitchen oxybutynin (DITROPAN) 5 MG tablet TAKE 1 TABLET  2  TO  3  TIMES  DAILY AS NEEDED  FOR  URINARY FREQUENCY  . quinapril (ACCUPRIL) 40 MG tablet TAKE 2 TABLETS EVERY DAY  . vitamin B-12 (CYANOCOBALAMIN) 100 MCG tablet Take 1,000 mcg by mouth daily.    No facility-administered encounter medications on file as of 11/08/2017.     Activities of Daily Living In your present state of health, do you have any difficulty performing the following activities: 11/08/2017 07/06/2017  Hearing?  N N  Vision? Y Y  Comment Due to cataracts.  trouble seeing due to cataracts  Difficulty concentrating or making decisions? N N  Walking or climbing stairs? Y Y  Comment Due to balance issues.  due to pains  Dressing or bathing? N N  Doing errands, shopping? Y Y  Comment Does not drive. -  Preparing Food and eating ? Y N  Comment Daughter helps. -  Using the Toilet? N N  In the past six months, have you accidently leaked urine? Tempie Donning  Comment Wears depends daily.  wears depends  Do you have problems with loss of bowel control? N N  Managing your Medications? N N  Managing your Finances? Y Y  Comment Has assistance -  Housekeeping or managing your Housekeeping? Y N  Comment Has assistance -  Some recent data might be hidden    Patient Care Team: Birdie Sons, MD as PCP - General (Family Medicine)    Assessment:   This is a routine wellness examination for [name].  Exercise Activities and Dietary recommendations Current Exercise Habits: The patient does not participate in regular exercise at present, Exercise limited by: Other - see comments(Spinal stenosis)  Goals    . DIET - REDUCE FRIED FOOD     Recommend avoiding fried foods when having to eat out. Daughter is looking into a food service with healthier food.        Fall Risk Fall Risk  11/08/2017 07/06/2017 02/26/2017 08/25/2016 08/13/2015  Falls in the past year? No No No No No  Risk for fall due to : - - Impaired balance/gait - -   Is the patient's home free of loose throw rugs in walkways, pet beds, electrical cords, etc?   yes      Grab bars in the bathroom? yes      Handrails on the stairs?   no      Adequate lighting?   yes  Timed Get Up and Go performed: N/A  Depression Screen PHQ 2/9 Scores 11/08/2017 07/06/2017 08/25/2016 08/13/2015  PHQ - 2 Score 2 0 0 0  PHQ- 9 Score 5 - - 1     Cognitive Function: Pt declined screening today.      6CIT Screen 08/25/2016  What Year? 0 points  What month? 0  points  What time? 0 points  Count back from 20 0 points  Months in reverse 0 points  Repeat phrase 4 points  Total Score 4    Immunization History  Administered Date(s) Administered  . Influenza, High Dose Seasonal PF 05/27/2015, 06/24/2016, 07/15/2017  . Pneumococcal Conjugate-13 08/07/2014  . Pneumococcal Polysaccharide-23 12/31/2000  . Td 02/19/1998  . Tdap 06/20/2011  . Zoster  12/13/2007    Qualifies for Shingles Vaccine? Pt declines today.   Screening Tests Health Maintenance  Topic Date Due  . FOOT EXAM  10/25/1942  . OPHTHALMOLOGY EXAM  10/25/1942  . HEMOGLOBIN A1C  01/04/2018  . MAMMOGRAM  05/10/2018  . DEXA SCAN  04/21/2020  . TETANUS/TDAP  06/19/2021  . INFLUENZA VACCINE  Completed  . PNA vac Low Risk Adult  Completed    Cancer Screenings: Lung: Low Dose CT Chest recommended if Age 6-80 years, 30 pack-year currently smoking OR have quit w/in 15years. Patient does not qualify. Breast:  Up to date on Mammogram? Yes   Up to date of Bone Density/Dexa? Yes Colorectal: N/A  Additional Screenings:  Hepatitis C Screening: N/A     Plan:  I have personally reviewed and addressed the Medicare Annual Wellness questionnaire and have noted the following in the patient's chart:  A. Medical and social history B. Use of alcohol, tobacco or illicit drugs  C. Current medications and supplements D. Functional ability and status E.  Nutritional status F.  Physical activity G. Advance directives H. List of other physicians I.  Hospitalizations, surgeries, and ER visits in previous 12 months J.  [place] such as hearing and vision if needed, cognitive and depression L. Referrals and appointments - none  In addition, I have reviewed and discussed with patient certain preventive protocols, quality metrics, and best practice recommendations. A written personalized care plan for preventive services as well as general preventive health recommendations were  provided to patient.  See attached scanned questionnaire for additional information.   Signed,  Fabio Neighbors, LPN Nurse Health Advisor   Nurse Recommendations: Pt needs a diabetic foot exam today. Pt does not plan to have an eye exam at this time.

## 2017-11-08 NOTE — Patient Instructions (Signed)
Ms. [name] [name] , Thank you for taking time to come for your Medicare Wellness Visit. I appreciate your ongoing commitment to your health goals. Please review the following plan we discussed and let me know if I can assist you in the future.   Screening recommendations/referrals: Colonoscopy: N/A Mammogram: Up to date Bone Density: Up to date Recommended yearly ophthalmology/optometry visit for glaucoma screening and checkup Recommended yearly dental visit for hygiene and checkup  Vaccinations: Influenza vaccine: Up to date Pneumococcal vaccine: Up to date Tdap vaccine: Up to date Shingles vaccine: Pt declines today.     Advanced directives: Please bring a copy of your POA (Power of Attorney) and/or Living Will to your next appointment.   Conditions/risks identified: Recommend avoiding fried foods when having to eat out. Daughter is looking into a food service with healthier food.   Next appointment: 11:15 AM today with Dr Caryn Section.   Preventive Care 65 Years and Older, Female Preventive care refers to lifestyle choices and visits with your health care provider that can promote health and wellness. What does preventive care include?  A yearly physical exam. This is also called an annual well check.  Dental exams once or twice a year.  Routine eye exams. Ask your health care provider how often you should have your eyes checked.  Personal lifestyle choices, including:  Daily care of your teeth and gums.  Regular physical activity.  Eating a healthy diet.  Avoiding tobacco and drug use.  Limiting alcohol use.  Practicing safe sex.  Taking low-dose aspirin every day.  Taking vitamin and mineral supplements as recommended by your health care provider. What happens during an annual well check? The services and screenings done by your health care provider during your annual well check will depend on your age, overall health, lifestyle risk factors, and family history of  disease. Counseling  Your health care provider may ask you questions about your:  Alcohol use.  Tobacco use.  Drug use.  Emotional well-being.  Home and relationship well-being.  Sexual activity.  Eating habits.  History of falls.  Memory and ability to understand (cognition).  Work and work Statistician.  Reproductive health. Screening  You may have the following tests or measurements:  Height, weight, and BMI.  Blood pressure.  Lipid and cholesterol levels. These may be checked every 5 years, or more frequently if you are over 69 years old.  Skin check.  Lung cancer screening. You may have this screening every year starting at age 45 if you have a 30-pack-year history of smoking and currently smoke or have quit within the past 15 years.  Fecal occult blood test (FOBT) of the stool. You may have this test every year starting at age 93.  Flexible sigmoidoscopy or colonoscopy. You may have a sigmoidoscopy every 5 years or a colonoscopy every 10 years starting at age 22.  Hepatitis C blood test.  Hepatitis B blood test.  Sexually transmitted disease (STD) testing.  Diabetes screening. This is done by checking your blood sugar (glucose) after you have not eaten for a while (fasting). You may have this done every 1-3 years.  Bone density scan. This is done to screen for osteoporosis. You may have this done starting at age 73.  Mammogram. This may be done every 1-2 years. Talk to your health care provider about how often you should have regular mammograms. Talk with your health care provider about your test results, treatment options, and if necessary, the need for more tests. Vaccines  Your health care provider may recommend certain vaccines, such as:  Influenza vaccine. This is recommended every year.  Tetanus, diphtheria, and acellular pertussis (Tdap, Td) vaccine. You may need a Td booster every 10 years.  Zoster vaccine. You may need this after age  74.  Pneumococcal 13-valent conjugate (PCV13) vaccine. One dose is recommended after age 19.  Pneumococcal polysaccharide (PPSV23) vaccine. One dose is recommended after age 2. Talk to your health care provider about which screenings and vaccines you need and how often you need them. This information is not intended to replace advice given to you by your health care provider. Make sure you discuss any questions you have with your health care provider. Document Released: 09/27/2015 Document Revised: 05/20/2016 Document Reviewed: 07/02/2015 Elsevier Interactive Patient Education  2017 [place] Prevention in the Home Falls can cause injuries. They can happen to people of all ages. There are many things you can do to make your home safe and to help prevent falls. What can I do on the outside of my home?  Regularly fix the edges of walkways and driveways and fix any cracks.  Remove anything that might make you trip as you walk through a door, such as a raised step or threshold.  Trim any bushes or trees on the path to your home.  Use bright outdoor lighting.  Clear any walking paths of anything that might make someone trip, such as rocks or tools.  Regularly check to see if handrails are loose or broken. Make sure that both sides of any steps have handrails.  Any raised decks and porches should have guardrails on the edges.  Have any leaves, snow, or ice cleared regularly.  Use sand or salt on walking paths during winter.  Clean up any spills in your garage right away. This includes oil or grease spills. What can I do in the bathroom?  Use night lights.  Install grab bars by the toilet and in the tub and shower. Do not use towel bars as grab bars.  Use non-skid mats or decals in the tub or shower.  If you need to sit down in the shower, use a plastic, non-slip stool.  Keep the floor dry. Clean up any water that spills on the floor as soon as it happens.  Remove  soap buildup in the tub or shower regularly.  Attach bath mats securely with double-sided non-slip rug tape.  Do not have throw rugs and other things on the floor that can make you trip. What can I do in the bedroom?  Use night lights.  Make sure that you have a light by your bed that is easy to reach.  Do not use any sheets or blankets that are too big for your bed. They should not hang down onto the floor.  Have a firm chair that has side arms. You can use this for support while you get dressed.  Do not have throw rugs and other things on the floor that can make you trip. What can I do in the kitchen?  Clean up any spills right away.  Avoid walking on wet floors.  Keep items that you use a lot in easy-to-reach places.  If you need to reach something above you, use a strong step stool that has a grab bar.  Keep electrical cords out of the way.  Do not use floor polish or wax that makes floors slippery. If you must use wax, use non-skid floor wax.  Do  not have throw rugs and other things on the floor that can make you trip. What can I do with my stairs?  Do not leave any items on the stairs.  Make sure that there are handrails on both sides of the stairs and use them. Fix handrails that are broken or loose. Make sure that handrails are as long as the stairways.  Check any carpeting to make sure that it is firmly attached to the stairs. Fix any carpet that is loose or worn.  Avoid having throw rugs at the top or bottom of the stairs. If you do have throw rugs, attach them to the floor with carpet tape.  Make sure that you have a light switch at the top of the stairs and the bottom of the stairs. If you do not have them, ask someone to add them for you. What else can I do to help prevent falls?  Wear shoes that:  Do not have high heels.  Have rubber bottoms.  Are comfortable and fit you well.  Are closed at the toe. Do not wear sandals.  If you use a  stepladder:  Make sure that it is fully opened. Do not climb a closed stepladder.  Make sure that both sides of the stepladder are locked into place.  Ask someone to hold it for you, if possible.  Clearly mark and make sure that you can see:  Any grab bars or handrails.  First and last steps.  Where the edge of each step is.  Use tools that help you move around (mobility aids) if they are needed. These include:  Canes.  Walkers.  Scooters.  Crutches.  Turn on the lights when you go into a dark area. Replace any light bulbs as soon as they burn out.  Set up your furniture so you have a clear path. Avoid moving your furniture around.  If any of your floors are uneven, fix them.  If there are any pets around you, be aware of where they are.  Review your medicines with your doctor. Some medicines can make you feel dizzy. This can increase your chance of falling. Ask your doctor what other things that you can do to help prevent falls. This information is not intended to replace advice given to you by your health care provider. Make sure you discuss any questions you have with your health care provider. Document Released: 06/27/2009 Document Revised: 02/06/2016 Document Reviewed: 10/05/2014 Elsevier Interactive Patient Education  2017 [employer].

## 2017-11-23 ENCOUNTER — Other Ambulatory Visit: Payer: Self-pay | Admitting: Family Medicine

## 2017-11-23 NOTE — Telephone Encounter (Signed)
Pt's daughter [name] is requesting that orders for pt's diabetic testing supplies be sent to [facility]. [name] stated that pt had been getting supplies from another supplier but they now need to get them from [facility] mail order. [name] stated that pt uses prodigy autocode glucometer and is needing testing strips and lancets sent to [facility]. Please advise. Thanks TNP

## 2017-11-24 MED ORDER — GLUCOSE BLOOD VI STRP: ORAL_STRIP | 3 refills | Status: DC

## 2017-11-24 MED ORDER — PRODIGY LANCETS 28G MISC: 3 refills | Status: DC

## 2017-11-24 MED ORDER — PRODIGY AUTOCODE BLOOD GLUCOSE W/DEVICE KIT: PACK | 0 refills | Status: DC

## 2017-11-29 ENCOUNTER — Telehealth: Payer: Self-pay

## 2017-11-29 NOTE — Telephone Encounter (Signed)
Pharmacist Lincoln Brigham from [place] order pharmacy called stating that they have sent [place] a request for BD single use alcohol swabs and Prodogy control solution, but haven't heard back about this request. [place] states Prodigy meter, strips and lancets that  Dr. Caryn Section prescribed will require a prior authorization because it is typically for patients who are blind. Alternative options (for glucometer, lancets and test strips) that are on patients formulary include:  *Accucheck Aviva Plus *Accucheck Guide *[employer] True Metrics  Do you want to change to one of these brands? The BD single use alcohol swabs are covered, and still need to be called in once a decision has been made about which meter to prescribe.  Pharmacy call back [phone]

## 2017-11-30 MED ORDER — ALCOHOL SWABS 70 % PADS: MEDICATED_PAD | 4 refills | Status: DC

## 2017-11-30 MED ORDER — ACCU-CHEK AVIVA PLUS W/DEVICE KIT: PACK | 0 refills | Status: DC

## 2017-11-30 MED ORDER — GLUCOSE BLOOD VI STRP: ORAL_STRIP | 4 refills | Status: DC

## 2017-11-30 MED ORDER — ACCU-CHEK SOFT TOUCH LANCETS MISC: 4 refills | Status: DC

## 2017-12-10 DIAGNOSIS — H2513 Age-related nuclear cataract, bilateral: Secondary | ICD-10-CM | POA: Diagnosis not present

## 2017-12-10 DIAGNOSIS — H40013 Open angle with borderline findings, low risk, bilateral: Secondary | ICD-10-CM | POA: Diagnosis not present

## 2017-12-10 DIAGNOSIS — H25013 Cortical age-related cataract, bilateral: Secondary | ICD-10-CM | POA: Diagnosis not present

## 2017-12-10 DIAGNOSIS — H35033 Hypertensive retinopathy, bilateral: Secondary | ICD-10-CM | POA: Diagnosis not present

## 2017-12-10 LAB — HM DIABETES EYE EXAM

## 2017-12-14 ENCOUNTER — Encounter: Payer: Self-pay | Admitting: *Deleted

## 2017-12-17 ENCOUNTER — Encounter: Payer: Self-pay | Admitting: Family Medicine

## 2017-12-17 DIAGNOSIS — H35033 Hypertensive retinopathy, bilateral: Secondary | ICD-10-CM

## 2017-12-18 DIAGNOSIS — Z0100 Encounter for examination of eyes and vision without abnormal findings: Secondary | ICD-10-CM | POA: Diagnosis not present

## 2018-01-10 ENCOUNTER — Other Ambulatory Visit: Payer: Self-pay | Admitting: Family Medicine

## 2018-01-10 DIAGNOSIS — J301 Allergic rhinitis due to pollen: Secondary | ICD-10-CM

## 2018-01-10 MED ORDER — FLUTICASONE PROPIONATE 50 MCG/ACT NA SUSP: 2.0000 | Freq: Every day | NASAL | 3 refills | Status: DC

## 2018-01-10 NOTE — Telephone Encounter (Signed)
[facility] pharmacy faxed a refill request for the following medication. Thanks CC  fluticasone (FLONASE) 50 MCG/ACT nasal spray

## 2018-02-02 ENCOUNTER — Other Ambulatory Visit: Payer: Self-pay | Admitting: Family Medicine

## 2018-02-02 DIAGNOSIS — M5136 Other intervertebral disc degeneration, lumbar region: Secondary | ICD-10-CM

## 2018-02-02 MED ORDER — GABAPENTIN 300 MG PO CAPS: 300.0000 mg | ORAL_CAPSULE | Freq: Two times a day (BID) | ORAL | 3 refills | Status: DC

## 2018-02-02 NOTE — Telephone Encounter (Signed)
[employer] faxed a refill request for the following medication. Thanks CC  gabapentin (NEURONTIN) 300 MG capsule

## 2018-02-28 ENCOUNTER — Other Ambulatory Visit: Payer: Self-pay | Admitting: Family Medicine

## 2018-03-14 ENCOUNTER — Other Ambulatory Visit: Payer: Self-pay | Admitting: Family Medicine

## 2018-03-14 DIAGNOSIS — J301 Allergic rhinitis due to pollen: Secondary | ICD-10-CM

## 2018-04-07 NOTE — Progress Notes (Signed)
Patient: [name] [name] Female    DOB: 02/09/1933   84 y.o.   MRN: [id number] Visit Date: 04/08/2018  Today's Provider: Lelon Huh, MD   Chief Complaint  Patient presents with  . Follow-up  . Diabetes  . Hypertension  . Hypothyroidism   Subjective:    HPI  Lab Results  Component Value Date   HGBA1C 6.6 (A) 04/08/2018     Diabetes Mellitus Type II, Follow-up:   Lab Results  Component Value Date   HGBA1C 6.6 (A) 04/08/2018   HGBA1C 6.3 11/08/2017   HGBA1C 6.3 07/06/2017   Last seen for diabetes 5 months ago.  Management since then includes; no changes. She reports good compliance with treatment. She is not having side effects. none Current symptoms include none and have been unchanged. Home blood sugar records: fasting range: 70-105  Episodes of hypoglycemia? no   Current Insulin Regimen: n/a Most Recent Eye Exam: 12/10/2017 Weight trend: stable Prior visit with dietician: no Current diet: well balanced Current exercise: none  ---------------------------------------------------------------   Hypertension, follow-up:  BP Readings from Last 3 Encounters:  04/08/18 (!) 170/73  11/08/17 134/64  07/06/17 (!) 148/70    She was last seen for hypertension 5 months ago.  BP at that visit was 134/64. Management since that visit includes; no cahnges.She reports good compliance with treatment. She is not having side effects. none She is not exercising. She is adherent to low salt diet.   Outside blood pressures are not checking. She is experiencing none.  Patient denies none.   Cardiovascular risk factors include diabetes mellitus.  Use of agents associated with hypertension: none.   ---------------------------------------------------------------  Hypothyroidism, unspecified type From 07/06/2017-labs checked, no changes.  Lab Results  Component Value Date   TSH 0.74 07/06/2017      Allergies  Allergen Reactions  . Lovastatin    Weakness  . Pravastatin Sodium     Muscle weakness  . Simvastatin     Fatigue     Current Outpatient Medications:  .  Alcohol Swabs 70 % PADS, Use to check blood sugar once a day Dx: E11.9, Disp: 100 each, Rfl: 4 .  amLODipine (NORVASC) 5 MG tablet, TAKE 1 TABLET EVERY DAY, Disp: 90 tablet, Rfl: 4 .  aspirin 81 MG tablet, Take 1 tablet by mouth daily., Disp: , Rfl:  .  Blood Glucose Monitoring Suppl (ACCU-CHEK AVIVA PLUS) w/Device KIT, Use to check blood sugar once a day Dx: E11.9, Disp: 1 kit, Rfl: 0 .  cholecalciferol (VITAMIN D) 1000 UNITS tablet, Take 1 tablet by mouth daily., Disp: , Rfl:  .  fluticasone (FLONASE) 50 MCG/ACT nasal spray, PLACE 2 SPRAYS INTO BOTH NOSTRILS DAILY., Disp: 48 g, Rfl: 3 .  furosemide (LASIX) 20 MG tablet, TAKE 1 TABLET EVERY DAY, Disp: 90 tablet, Rfl: 4 .  gabapentin (NEURONTIN) 300 MG capsule, Take 1 capsule (300 mg total) by mouth 2 (two) times daily., Disp: 180 capsule, Rfl: 3 .  glipiZIDE (GLUCOTROL XL) 10 MG 24 hr tablet, Take 1 tablet (10 mg total) by mouth daily., Disp: 90 tablet, Rfl: 4 .  glucose blood (ACCU-CHEK AVIVA PLUS) test strip, Use to check blood sugar once a day Dx: E11.9, Disp: 100 each, Rfl: 4 .  Lancets (ACCU-CHEK SOFT TOUCH) lancets, Use to check blood sugar once a day Dx: E11.9, Disp: 100 each, Rfl: 4 .  levothyroxine (SYNTHROID, LEVOTHROID) 75 MCG tablet, TAKE 1 TABLET EVERY DAY, Disp: 90 tablet, Rfl: 4 .  metFORMIN (GLUCOPHAGE-XR) 750 MG 24 hr tablet, TAKE 1 TABLET TWICE DAILY, Disp: 180 tablet, Rfl: 4 .  Omega-3 Fatty Acids (FISH OIL) 1000 MG CAPS, Take 1 capsule by mouth daily., Disp: , Rfl:  .  oxybutynin (DITROPAN) 5 MG tablet, TAKE 1 TABLET  2  TO  3  TIMES  DAILY AS NEEDED  FOR  URINARY FREQUENCY, Disp: 180 tablet, Rfl: 4 .  quinapril (ACCUPRIL) 40 MG tablet, TAKE 2 TABLETS EVERY DAY, Disp: 180 tablet, Rfl: 4 .  vitamin B-12 (CYANOCOBALAMIN) 100 MCG tablet, Take 1,000 mcg by mouth daily. , Disp: , Rfl:   Review of Systems    Constitutional: Negative for appetite change, chills, fatigue and fever.  Respiratory: Negative for chest tightness and shortness of breath.   Cardiovascular: Negative for chest pain and palpitations.  Gastrointestinal: Negative for abdominal pain, nausea and vomiting.  Neurological: Negative for dizziness and weakness.    Social History   Tobacco Use  . Smoking status: Never Smoker  . Smokeless tobacco: Never Used  Substance Use Topics  . Alcohol use: No    Alcohol/week: 0.0 oz   Objective:   BP (!) 170/73 (BP Location: Right Arm, Patient Position: Sitting, Cuff Size: Normal)   Pulse 60   Temp 98.3 F (36.8 C) (Oral)   Resp 16   Ht '5\' 4"'$  (1.626 m)   Wt 176 lb (79.8 kg)   SpO2 96%   BMI 30.21 kg/m  Vitals:   04/08/18 1119 04/08/18 1148  BP: (!) 170/73 (!) 154/72  Pulse: 60   Resp: 16   Temp: 98.3 F (36.8 C)   TempSrc: Oral   SpO2: 96%   Weight: 176 lb (79.8 kg)   Height: '5\' 4"'$  (1.626 m)      Physical Exam   General Appearance:    Alert, cooperative, no distress  Eyes:    PERRL, conjunctiva/corneas clear, EOM's intact       Lungs:     Clear to auscultation bilaterally, respirations unlabored  Heart:    Regular rate and rhythm  Neurologic:   Awake, alert, oriented x 3. No apparent focal neurological           defect.       Results for orders placed or performed in visit on 04/08/18  POCT glycosylated hemoglobin (Hb A1C)  Result Value Ref Range   Hemoglobin A1C 6.6 (A) 4.0 - 5.6 %   HbA1c POC (<> result, manual entry)  4.0 - 5.6 %   HbA1c, POC (prediabetic range)  5.7 - 6.4 %   HbA1c, POC (controlled diabetic range)  0.0 - 7.0 %   Est. average glucose Bld gHb Est-mCnc 143        Assessment & Plan:     1. Diabetes mellitus type 2 with neurological manifestations ([place]) Well controlled.  Continue current medications.   - POCT glycosylated hemoglobin (Hb A1C)  2. . Essential hypertension BP up today, but usually better controlled. Continue current  medications.  Consider addition of thiazide diuretic if remains consistently over 150.   3. Polyuria Counseled on contribution of anticholinergics such as oxybutynin on cognition. Patent's daughter feels that patient has had decline in memory and thinking. Try change to - solifenacin (VESICARE) 5 MG tablet; Take 1 tablet (5 mg total) by mouth daily.  Dispense: 90 tablet; Refill: 2 Consider d/c anticholinergics altogether .   Return in about 6 months (around 10/09/2018).        Lelon Huh, MD  [facility]  [facility]  

## 2018-04-08 ENCOUNTER — Encounter: Payer: Self-pay | Admitting: Family Medicine

## 2018-04-08 ENCOUNTER — Ambulatory Visit (INDEPENDENT_AMBULATORY_CARE_PROVIDER_SITE_OTHER): Payer: Medicare HMO | Admitting: Family Medicine

## 2018-04-08 VITALS — BP 154/72 | HR 60 | Temp 98.3°F | Resp 16 | Ht 64.0 in | Wt 176.0 lb

## 2018-04-08 DIAGNOSIS — Z6830 Body mass index (BMI) 30.0-30.9, adult: Secondary | ICD-10-CM | POA: Diagnosis not present

## 2018-04-08 DIAGNOSIS — I10 Essential (primary) hypertension: Secondary | ICD-10-CM | POA: Diagnosis not present

## 2018-04-08 DIAGNOSIS — R358 Other polyuria: Secondary | ICD-10-CM | POA: Diagnosis not present

## 2018-04-08 DIAGNOSIS — R3589 Other polyuria: Secondary | ICD-10-CM

## 2018-04-08 DIAGNOSIS — E1149 Type 2 diabetes mellitus with other diabetic neurological complication: Secondary | ICD-10-CM

## 2018-04-08 LAB — POCT GLYCOSYLATED HEMOGLOBIN (HGB A1C)
Est. average glucose Bld gHb Est-mCnc: 143
Hemoglobin A1C: 6.6 % — AB (ref 4.0–5.6)

## 2018-04-08 MED ORDER — SOLIFENACIN SUCCINATE 5 MG PO TABS: 5.0000 mg | ORAL_TABLET | Freq: Every day | ORAL | 2 refills | Status: DC

## 2018-05-24 ENCOUNTER — Other Ambulatory Visit: Payer: Self-pay | Admitting: Family Medicine

## 2018-06-28 ENCOUNTER — Other Ambulatory Visit: Payer: Self-pay | Admitting: Family Medicine

## 2018-06-28 DIAGNOSIS — E1149 Type 2 diabetes mellitus with other diabetic neurological complication: Secondary | ICD-10-CM

## 2018-07-20 ENCOUNTER — Telehealth: Payer: Self-pay | Admitting: Family Medicine

## 2018-07-20 NOTE — Telephone Encounter (Signed)
Pt's daughter [name] ([place]) advised.   Thanks,   -Mickel Baas

## 2018-07-20 NOTE — Telephone Encounter (Signed)
Please give her contact information to schedule at [facility].

## 2018-07-20 NOTE — Telephone Encounter (Signed)
Pt needing a mammogram done in [place].  Has been done in [place] in the past. Now needing the referral to go to a place in [place].  Thanks, [employer]

## 2018-07-22 ENCOUNTER — Other Ambulatory Visit: Payer: Self-pay | Admitting: Family Medicine

## 2018-07-22 DIAGNOSIS — Z1231 Encounter for screening mammogram for malignant neoplasm of breast: Secondary | ICD-10-CM

## 2018-07-27 ENCOUNTER — Ambulatory Visit: Payer: Medicare HMO

## 2018-07-30 ENCOUNTER — Ambulatory Visit (INDEPENDENT_AMBULATORY_CARE_PROVIDER_SITE_OTHER): Payer: Medicare HMO

## 2018-07-30 DIAGNOSIS — Z23 Encounter for immunization: Secondary | ICD-10-CM | POA: Diagnosis not present

## 2018-08-22 ENCOUNTER — Ambulatory Visit: Payer: Medicare HMO | Admitting: Family Medicine

## 2018-08-29 ENCOUNTER — Ambulatory Visit
Admission: RE | Admit: 2018-08-29 | Discharge: 2018-08-29 | Disposition: A | Discharge status: Home / Self Care | Payer: Commercial Managed Care - HMO | Source: Ambulatory Visit | Attending: Family Medicine | Admitting: Family Medicine

## 2018-08-29 DIAGNOSIS — Z1231 Encounter for screening mammogram for malignant neoplasm of breast: Secondary | ICD-10-CM | POA: Insufficient documentation

## 2018-10-10 ENCOUNTER — Encounter: Payer: Self-pay | Admitting: Family Medicine

## 2018-10-10 ENCOUNTER — Ambulatory Visit (INDEPENDENT_AMBULATORY_CARE_PROVIDER_SITE_OTHER): Payer: Medicare HMO | Admitting: Family Medicine

## 2018-10-10 VITALS — BP 140/70 | HR 58 | Temp 98.5°F | Resp 16 | Wt 176.0 lb

## 2018-10-10 DIAGNOSIS — E039 Hypothyroidism, unspecified: Secondary | ICD-10-CM | POA: Diagnosis not present

## 2018-10-10 DIAGNOSIS — M85852 Other specified disorders of bone density and structure, left thigh: Secondary | ICD-10-CM | POA: Diagnosis not present

## 2018-10-10 DIAGNOSIS — R200 Anesthesia of skin: Secondary | ICD-10-CM

## 2018-10-10 DIAGNOSIS — I10 Essential (primary) hypertension: Secondary | ICD-10-CM | POA: Diagnosis not present

## 2018-10-10 DIAGNOSIS — E785 Hyperlipidemia, unspecified: Secondary | ICD-10-CM

## 2018-10-10 DIAGNOSIS — E1149 Type 2 diabetes mellitus with other diabetic neurological complication: Secondary | ICD-10-CM

## 2018-10-10 LAB — POCT GLYCOSYLATED HEMOGLOBIN (HGB A1C)
Est. average glucose Bld gHb Est-mCnc: 143
HEMOGLOBIN A1C: 6.6 % — AB (ref 4.0–5.6)

## 2018-10-10 MED ORDER — PREGABALIN 75 MG PO CAPS: 75.0000 mg | ORAL_CAPSULE | Freq: Two times a day (BID) | ORAL | 3 refills | Status: DC

## 2018-10-10 NOTE — Progress Notes (Signed)
Patient: [name] [name] Female    DOB: 24-Aug-1933   85 y.o.   MRN: [id number] Visit Date: 10/10/2018  Today's Provider: Lelon Huh, MD   Chief Complaint  Patient presents with  . Diabetes  . Hypertension   Subjective:     HPI  Diabetes Mellitus Type II, Follow-up:   Lab Results  Component Value Date   HGBA1C 6.6 (A) 04/08/2018   HGBA1C 6.3 11/08/2017   HGBA1C 6.3 07/06/2017    Last seen for diabetes 6 months ago.  Management since then includes no changes. She reports good compliance with treatment. She is not having side effects.  Current symptoms include none and have been stable. Home blood sugar records: fasting range: 98  Episodes of hypoglycemia? no   Current Insulin Regimen: none Most Recent Eye Exam: 12/10/2017 Weight trend: stable Prior visit with dietician: no Current diet: well balanced Current exercise: walking   He primary complaint today is numbness in her feet and repeated asks if she can see someone to figure out what is going on. Is not at all painful, does not keep her up night, but is still very bothered it.   Pertinent Labs:    Component Value Date/Time   CHOL 202 (H) 04/01/2015 0955   TRIG 105 04/01/2015 0955   HDL 41 04/01/2015 0955   LDLCALC 140 (H) 04/01/2015 0955   CREATININE 0.90 (H) 07/06/2017 1049    Wt Readings from Last 3 Encounters:  10/10/18 176 lb (79.8 kg)  04/08/18 176 lb (79.8 kg)  11/08/17 176 lb 9.6 oz (80.1 kg)    ------------------------------------------------------------------------  Hypertension, follow-up:  BP Readings from Last 3 Encounters:  10/10/18 140/70  04/08/18 (!) 154/72  11/08/17 134/64    She was last seen for hypertension 6 months ago.  BP at that visit was 170/73. Management since that visit includes no changes. She reports good compliance with treatment. She is not having side effects.  She is exercising. She is adherent to low salt diet.   Outside blood pressures are  checked at home and are normal per patient report. She is experiencing none.  Patient denies chest pain, chest pressure/discomfort, claudication, dyspnea, exertional chest pressure/discomfort, fatigue, irregular heart beat, lower extremity edema, near-syncope, orthopnea, palpitations, paroxysmal nocturnal dyspnea, syncope and tachypnea.   Cardiovascular risk factors include advanced age (older than 88 for men, 51 for women), diabetes mellitus and hypertension.  Use of agents associated with hypertension: NSAIDS.     Weight trend: stable Wt Readings from Last 3 Encounters:  10/10/18 176 lb (79.8 kg)  04/08/18 176 lb (79.8 kg)  11/08/17 176 lb 9.6 oz (80.1 kg)    Current diet: well balanced  ------------------------------------------------------------------------  Allergies  Allergen Reactions  . Lovastatin     Weakness  . Pravastatin Sodium     Muscle weakness  . Simvastatin     Fatigue     Current Outpatient Medications:  .  Alcohol Swabs 70 % PADS, Use to check blood sugar once a day Dx: E11.9, Disp: 100 each, Rfl: 4 .  amLODipine (NORVASC) 5 MG tablet, TAKE 1 TABLET EVERY DAY, Disp: 90 tablet, Rfl: 4 .  aspirin 81 MG tablet, Take 1 tablet by mouth daily., Disp: , Rfl:  .  Blood Glucose Monitoring Suppl (ACCU-CHEK AVIVA PLUS) w/Device KIT, Use to check blood sugar once a day Dx: E11.9, Disp: 1 kit, Rfl: 0 .  cholecalciferol (VITAMIN D) 1000 UNITS tablet, Take 1 tablet by mouth daily.,  Disp: , Rfl:  .  fluticasone (FLONASE) 50 MCG/ACT nasal spray, PLACE 2 SPRAYS INTO BOTH NOSTRILS DAILY., Disp: 48 g, Rfl: 3 .  furosemide (LASIX) 20 MG tablet, TAKE 1 TABLET EVERY DAY, Disp: 90 tablet, Rfl: 4 .  glipiZIDE (GLUCOTROL XL) 10 MG 24 hr tablet, TAKE 1 TABLET (10 MG TOTAL) BY MOUTH DAILY., Disp: 90 tablet, Rfl: 4 .  glucose blood (ACCU-CHEK AVIVA PLUS) test strip, Use to check blood sugar once a day Dx: E11.9, Disp: 100 each, Rfl: 4 .  Lancets (ACCU-CHEK SOFT TOUCH) lancets, Use to  check blood sugar once a day Dx: E11.9, Disp: 100 each, Rfl: 4 .  levothyroxine (SYNTHROID, LEVOTHROID) 75 MCG tablet, TAKE 1 TABLET EVERY DAY, Disp: 90 tablet, Rfl: 4 .  metFORMIN (GLUCOPHAGE-XR) 750 MG 24 hr tablet, TAKE 1 TABLET TWICE DAILY, Disp: 180 tablet, Rfl: 4 .  Omega-3 Fatty Acids (FISH OIL) 1000 MG CAPS, Take 1 capsule by mouth daily., Disp: , Rfl:  .  quinapril (ACCUPRIL) 40 MG tablet, TAKE 2 TABLETS EVERY DAY, Disp: 180 tablet, Rfl: 4 .  solifenacin (VESICARE) 5 MG tablet, Take 1 tablet (5 mg total) by mouth daily., Disp: 90 tablet, Rfl: 2 .  vitamin B-12 (CYANOCOBALAMIN) 100 MCG tablet, Take 1,000 mcg by mouth daily. , Disp: , Rfl:  .  gabapentin (NEURONTIN) 300 MG capsule, Take 1 capsule (300 mg total) by mouth 2 (two) times daily. (Patient not taking: Reported on 10/10/2018), Disp: 180 capsule, Rfl: 3 .  oxybutynin (DITROPAN) 5 MG tablet, TAKE 1 TABLET  2  TO  3  TIMES  DAILY AS NEEDED  FOR  URINARY FREQUENCY (Patient not taking: Reported on 10/10/2018), Disp: 180 tablet, Rfl: 4  Review of Systems  Constitutional: Negative for appetite change, chills, fatigue and fever.  Respiratory: Negative for chest tightness and shortness of breath.   Cardiovascular: Negative for chest pain and palpitations.  Gastrointestinal: Negative for abdominal pain, nausea and vomiting.  Neurological: Negative for dizziness and weakness.    Social History   Tobacco Use  . Smoking status: Never Smoker  . Smokeless tobacco: Never Used  Substance Use Topics  . Alcohol use: No    Alcohol/week: 0.0 standard drinks      Objective:   BP 140/70 (BP Location: Left Arm, Patient Position: Sitting, Cuff Size: Large)   Pulse (!) 58   Temp 98.5 F (36.9 C) (Oral)   Resp 16   Wt 176 lb (79.8 kg)   SpO2 98% Comment: room air  BMI 30.21 kg/m     Physical Exam   General Appearance:    Alert, cooperative, no distress  Eyes:    PERRL, conjunctiva/corneas clear, EOM's intact       Lungs:     Clear  to auscultation bilaterally, respirations unlabored  Heart:    Regular rate and rhythm  Neurologic:   Awake, alert, oriented x 3. No apparent focal neurological           defect.       Results for orders placed or performed in visit on 10/10/18  Comprehensive metabolic panel  POCT HgB V6H  Result Value Ref Range   Hemoglobin A1C 6.6 (A) 4.0 - 5.6 %   HbA1c POC (<> result, manual entry)     HbA1c, POC (prediabetic range)     HbA1c, POC (controlled diabetic range)     Est. average glucose Bld gHb Est-mCnc 143         Assessment & Plan  1. Numbness of feet Likely secondary to diabetes. Also checking vitamin d today as below. Advised that neuropathy with pain usually improves with medications, but may not help with numbness. Will try pregabalin as below, but she seems to believe there must be another explanation and may need neurology referral for additional evaluation.   2. Diabetes mellitus type 2 with neurological manifestations ([place])  - POCT HgB A1C - pregabalin (LYRICA) 75 MG capsule; Take 1 capsule (75 mg total) by mouth 2 (two) times daily.  Dispense: 180 capsule; Refill: 3  3. Essential hypertension Well controlled.  Continue current medications.    4. Hypothyroidism, unspecified type  - TSH  5. Hyperlipidemia, unspecified hyperlipidemia type Intolerant to at least 3 statins.  Is willing to try ezetimibe  - Comprehensive metabolic panel   6. Osteopenia of neck of left femur  - VITAMIN D 25 Hydroxy (Vit-D Deficiency, Fractures)  Future Appointments  Date Time Provider [place]  04/10/2019 11:00 AM Fisher, Kirstie Peri, MD [facility] None       Lelon Huh, MD  [facility]

## 2018-10-11 ENCOUNTER — Telehealth: Payer: Self-pay

## 2018-10-11 LAB — LIPID PANEL
Chol/HDL Ratio: 5.6 ratio — ABNORMAL HIGH (ref 0.0–4.4)
Cholesterol, Total: 222 mg/dL — ABNORMAL HIGH (ref 100–199)
HDL: 40 mg/dL (ref >39)
LDL CALC: 157 mg/dL — AB (ref 0–99)
Triglycerides: 123 mg/dL (ref 0–149)
VLDL Cholesterol Cal: 25 mg/dL (ref 5–40)

## 2018-10-11 LAB — COMPREHENSIVE METABOLIC PANEL
A/G RATIO: 1.4 (ref 1.2–2.2)
ALBUMIN: 4.5 g/dL (ref 3.6–4.6)
ALT: 29 IU/L (ref 0–32)
AST: 34 IU/L (ref 0–40)
Alkaline Phosphatase: 72 IU/L (ref 39–117)
BUN / CREAT RATIO: 9 — AB (ref 12–28)
BUN: 8 mg/dL (ref 8–27)
Bilirubin Total: 0.5 mg/dL (ref 0.0–1.2)
CALCIUM: 10 mg/dL (ref 8.7–10.3)
CO2: 26 mmol/L (ref 20–29)
CREATININE: 0.88 mg/dL (ref 0.57–1.00)
Chloride: 103 mmol/L (ref 96–106)
GFR calc Af Amer: 70 mL/min/{1.73_m2} (ref >59)
GFR, EST NON AFRICAN AMERICAN: 61 mL/min/{1.73_m2} (ref >59)
Globulin, Total: 3.2 g/dL (ref 1.5–4.5)
Glucose: 77 mg/dL (ref 65–99)
POTASSIUM: 4.2 mmol/L (ref 3.5–5.2)
SODIUM: 148 mmol/L — AB (ref 134–144)
Total Protein: 7.7 g/dL (ref 6.0–8.5)

## 2018-10-11 LAB — TSH: TSH: 0.256 u[IU]/mL — ABNORMAL LOW (ref 0.450–4.500)

## 2018-10-11 LAB — VITAMIN D 25 HYDROXY (VIT D DEFICIENCY, FRACTURES): VIT D 25 HYDROXY: 55.8 ng/mL (ref 30.0–100.0)

## 2018-10-11 NOTE — Telephone Encounter (Signed)
Spoke with Stephens November patient's daughter she is on the [facility] and Mrs. Krock at the same time and they were advised of results. They requested for medication to be send to [facility] mail order please.

## 2018-10-11 NOTE — Telephone Encounter (Signed)
LMTCB  10/11/2018  Thanks,   -Mickel Baas

## 2018-10-11 NOTE — Telephone Encounter (Signed)
-----   Message from Birdie Sons, MD sent at 10/11/2018  8:04 AM EST ----- Cholesterol much too high. Need to start ezetimibe 10mg  daily #90, rf x 3 (this is NOT a statin, so should not cause side effects like previous cholesterol medications)  Rest of labs are normal.

## 2018-10-12 MED ORDER — EZETIMIBE 10 MG PO TABS
10.0000 mg | ORAL_TABLET | Freq: Every day | ORAL | 3 refills | Status: DC
Start: 2018-10-12 — End: 2019-06-01

## 2018-10-13 NOTE — Patient Instructions (Signed)
.   Please review the attached list of medications and notify my office if there are any errors.   . Please bring all of your medications to every appointment so we can make sure that our medication list is the same as yours.   

## 2018-10-24 ENCOUNTER — Other Ambulatory Visit: Payer: Self-pay | Admitting: Family Medicine

## 2018-12-06 ENCOUNTER — Other Ambulatory Visit: Payer: Self-pay | Admitting: Family Medicine

## 2018-12-07 NOTE — Telephone Encounter (Signed)
Need to verify if patient needs a new meter. Tried calling patient, left message to call back.

## 2018-12-09 NOTE — Telephone Encounter (Signed)
Pt does need a new meter her old one stopped working.  Using [employer]

## 2018-12-23 ENCOUNTER — Other Ambulatory Visit: Payer: Self-pay | Admitting: Family Medicine

## 2019-01-02 ENCOUNTER — Other Ambulatory Visit: Payer: Self-pay | Admitting: Family Medicine

## 2019-01-02 DIAGNOSIS — E1149 Type 2 diabetes mellitus with other diabetic neurological complication: Secondary | ICD-10-CM

## 2019-01-02 DIAGNOSIS — J301 Allergic rhinitis due to pollen: Secondary | ICD-10-CM

## 2019-01-03 MED ORDER — GLUCOSE BLOOD VI STRP: ORAL_STRIP | 4 refills | Status: AC

## 2019-01-03 NOTE — Telephone Encounter (Signed)
I called and spoke with patient's daughter [name], who says that patient does not need the accucheck device test kit. She only needs the test strips.

## 2019-01-03 NOTE — Telephone Encounter (Signed)
receieved refill request from mail order for accucheck device test kit. Does she need the actual kit or just strips?

## 2019-01-10 ENCOUNTER — Other Ambulatory Visit: Payer: Self-pay | Admitting: Family Medicine

## 2019-03-02 ENCOUNTER — Other Ambulatory Visit: Payer: Self-pay | Admitting: Family Medicine

## 2019-04-10 ENCOUNTER — Encounter: Payer: Self-pay | Admitting: Family Medicine

## 2019-04-10 ENCOUNTER — Ambulatory Visit (INDEPENDENT_AMBULATORY_CARE_PROVIDER_SITE_OTHER): Payer: Medicare HMO | Admitting: Family Medicine

## 2019-04-10 ENCOUNTER — Other Ambulatory Visit: Payer: Self-pay

## 2019-04-10 VITALS — BP 143/76 | HR 67 | Temp 98.6°F | Resp 16 | Wt 176.0 lb

## 2019-04-10 DIAGNOSIS — E785 Hyperlipidemia, unspecified: Secondary | ICD-10-CM | POA: Diagnosis not present

## 2019-04-10 DIAGNOSIS — E538 Deficiency of other specified B group vitamins: Secondary | ICD-10-CM | POA: Diagnosis not present

## 2019-04-10 DIAGNOSIS — E1149 Type 2 diabetes mellitus with other diabetic neurological complication: Secondary | ICD-10-CM | POA: Diagnosis not present

## 2019-04-10 DIAGNOSIS — E039 Hypothyroidism, unspecified: Secondary | ICD-10-CM

## 2019-04-10 DIAGNOSIS — I10 Essential (primary) hypertension: Secondary | ICD-10-CM

## 2019-04-10 LAB — POCT GLYCOSYLATED HEMOGLOBIN (HGB A1C)
Est. average glucose Bld gHb Est-mCnc: 160
Hemoglobin A1C: 7.2 % — AB (ref 4.0–5.6)

## 2019-04-10 MED ORDER — GABAPENTIN 300 MG PO CAPS: 300.0000 mg | ORAL_CAPSULE | Freq: Every day | ORAL | 3 refills | Status: DC

## 2019-04-10 MED ORDER — GABAPENTIN 100 MG PO CAPS: 100.0000 mg | ORAL_CAPSULE | ORAL | 3 refills | Status: DC

## 2019-04-10 NOTE — Progress Notes (Signed)
Patient: [name] [name] Female    DOB: [birth year]   85 y.o.   MRN: [id number] Visit Date: 04/10/2019  Today's Provider: Lelon Huh, MD   Chief Complaint  Patient presents with  . Follow-up   Subjective:     HPI  Diabetes Mellitus Type II, Follow-up:   Lab Results  Component Value Date   HGBA1C 7.2 (A) 04/10/2019   HGBA1C 6.6 (A) 10/10/2018   HGBA1C 6.6 (A) 04/08/2018    Last seen for diabetes 6 months ago.  Management since then includes starting Pregabalin due to numbness. She reports fair compliance with treatment. Patient stopped taking Lyrica and went back to taking Gabapentin due to no improvement of symptoms.  She is not having side effects.  Current symptoms include none and have been stable. Home blood sugar records: fasting range: 130's  Episodes of hypoglycemia? no   Current insulin regiment: Is not on insulin Most Recent Eye Exam: >1 year ago Weight trend: stable Prior visit with dietician: No Current exercise: none Current diet habits: well balanced  Pertinent Labs:    Component Value Date/Time   CHOL 222 (H) 10/10/2018 0000   TRIG 123 10/10/2018 0000   HDL 40 10/10/2018 0000   LDLCALC 157 (H) 10/10/2018 0000   CREATININE 0.88 10/10/2018 0000   CREATININE 0.90 (H) 07/06/2017 1049    Wt Readings from Last 3 Encounters:  04/10/19 176 lb (79.8 kg)  10/10/18 176 lb (79.8 kg)  04/08/18 176 lb (79.8 kg)    ------------------------------------------------------------------------  Lipid/Cholesterol, Follow-up:   Last seen for this 6 months ago.  Management changes since that visit include starting Ezetimibe. . Last Lipid Panel:    Component Value Date/Time   CHOL 222 (H) 10/10/2018 0000   TRIG 123 10/10/2018 0000   HDL 40 10/10/2018 0000   CHOLHDL 5.6 (H) 10/10/2018 0000   LDLCALC 157 (H) 10/10/2018 0000    Risk factors for vascular disease include diabetes mellitus  She reports good compliance with treatment. She is not  having side effects.  Current symptoms include none and have been stable. Weight trend: stable Prior visit with dietician: no Current diet: well balanced Current exercise: none  Wt Readings from Last 3 Encounters:  04/10/19 176 lb (79.8 kg)  10/10/18 176 lb (79.8 kg)  04/08/18 176 lb (79.8 kg)    -------------------------------------------------------------------  Allergies  Allergen Reactions  . Lovastatin     Weakness  . Pravastatin Sodium     Muscle weakness  . Simvastatin     Fatigue     Current Outpatient Medications:  .  Accu-Chek Softclix Lancets lancets, USE TO CHECK BLOOD SUGAR ONCE A DAY, Disp: 100 each, Rfl: 4 .  Alcohol Swabs (B-D SINGLE USE SWABS REGULAR) PADS, USE TEST BLOOD SUGAR EVERY DAY, Disp: 100 each, Rfl: 4 .  amLODipine (NORVASC) 5 MG tablet, TAKE 1 TABLET EVERY DAY, Disp: 90 tablet, Rfl: 4 .  aspirin 81 MG tablet, Take 1 tablet by mouth daily., Disp: , Rfl:  .  Blood Glucose Monitoring Suppl (ACCU-CHEK AVIVA PLUS) w/Device KIT, USE TO CHECK BLOOD SUGAR ONCE A DAY, Disp: 1 kit, Rfl: 0 .  cholecalciferol (VITAMIN D) 1000 UNITS tablet, Take 1 tablet by mouth daily., Disp: , Rfl:  .  ezetimibe (ZETIA) 10 MG tablet, Take 1 tablet (10 mg total) by mouth daily., Disp: 90 tablet, Rfl: 3 .  fluticasone (FLONASE) 50 MCG/ACT nasal spray, PLACE 2 SPRAYS INTO BOTH NOSTRILS DAILY., Disp: 48 g, Rfl:  3 .  furosemide (LASIX) 20 MG tablet, TAKE 1 TABLET EVERY DAY, Disp: 90 tablet, Rfl: 4 .  gabapentin (NEURONTIN) 300 MG capsule, Take 300 mg by mouth 3 (three) times daily., Disp: , Rfl:  .  glipiZIDE (GLUCOTROL XL) 10 MG 24 hr tablet, TAKE 1 TABLET (10 MG TOTAL) BY MOUTH DAILY., Disp: 90 tablet, Rfl: 4 .  glucose blood (ACCU-CHEK AVIVA PLUS) test strip, TEST BLOOD SUGAR ONE TIME DAILY, Disp: 100 each, Rfl: 4 .  levothyroxine (SYNTHROID) 75 MCG tablet, TAKE 1 TABLET EVERY DAY, Disp: 90 tablet, Rfl: 4 .  metFORMIN (GLUCOPHAGE-XR) 750 MG 24 hr tablet, TAKE 1 TABLET TWICE  DAILY, Disp: 180 tablet, Rfl: 4 .  Omega-3 Fatty Acids (FISH OIL) 1000 MG CAPS, Take 1 capsule by mouth daily., Disp: , Rfl:  .  oxybutynin (DITROPAN) 5 MG tablet, TAKE 1 TABLET  2  TO  3  TIMES  DAILY AS NEEDED  FOR  URINARY FREQUENCY, Disp: 180 tablet, Rfl: 4 .  quinapril (ACCUPRIL) 40 MG tablet, TAKE 2 TABLETS EVERY DAY, Disp: 180 tablet, Rfl: 4 .  solifenacin (VESICARE) 5 MG tablet, Take 1 tablet (5 mg total) by mouth daily., Disp: 90 tablet, Rfl: 2 .  vitamin B-12 (CYANOCOBALAMIN) 100 MCG tablet, Take 1,000 mcg by mouth daily. , Disp: , Rfl:  .  pregabalin (LYRICA) 75 MG capsule, Take 1 capsule (75 mg total) by mouth 2 (two) times daily. (Patient not taking: Reported on 04/10/2019), Disp: 180 capsule, Rfl: 3  Review of Systems  Constitutional: Negative for appetite change, chills, fatigue and fever.  Respiratory: Negative for chest tightness and shortness of breath.   Cardiovascular: Negative for chest pain and palpitations.  Gastrointestinal: Negative for abdominal pain, nausea and vomiting.  Musculoskeletal: Positive for myalgias (pain in left leg).  Neurological: Positive for numbness (in left leg). Negative for dizziness and weakness.    Social History   Tobacco Use  . Smoking status: Never Smoker  . Smokeless tobacco: Never Used  Substance Use Topics  . Alcohol use: No    Alcohol/week: 0.0 standard drinks      Objective:    Vitals:   04/10/19 1122 04/10/19 1124  BP: (!) 150/73 (!) 143/76  Pulse: 67   Resp: 16   Temp: 98.6 F (37 C)   TempSrc: Oral   SpO2: 98%   Weight: 176 lb (79.8 kg)      Physical Exam   General Appearance:    Alert, cooperative, no distress  Eyes:    PERRL, conjunctiva/corneas clear, EOM's intact       Lungs:     Clear to auscultation bilaterally, respirations unlabored  Heart:    Normal heart rate. Normal rhythm. No murmurs, rubs, or gallops.   Neurologic:   Awake, alert, oriented x 3. No apparent focal neurological           defect.         Results for orders placed or performed in visit on 04/10/19  POCT HgB A1C  Result Value Ref Range   Hemoglobin A1C 7.2 (A) 4.0 - 5.6 %   HbA1c POC (<> result, manual entry)     HbA1c, POC (prediabetic range)     HbA1c, POC (controlled diabetic range)     Est. average glucose Bld gHb Est-mCnc 160        Assessment & Plan    1. Diabetes mellitus type 2 with neurological manifestations ([place]) a1c is stable, but having neuropathy pain throughout the day.  The lyrica was too expensive, and she found the 324m gabapentin makes her very drowsy during the day. Will have her take 300 at bedtime and just a 1090mcapsule in the am.  - gabapentin (NEURONTIN) 100 MG capsule; Take 1 capsule (100 mg total) by mouth every morning.  Dispense: 90 capsule; Refill: 3 - gabapentin (NEURONTIN) 300 MG capsule; Take 1 capsule (300 mg total) by mouth at bedtime. In addition to the 10080mapsule in the morning  Dispense: 90 capsule; Refill: 3 - TSH  2. Hypothyroidism, unspecified type -TSH  3. Essential hypertension fairly well controlled. Continue current medications.    4. B12 deficiency  - Vitamin B12  5. Hyperlipidemia, unspecified hyperlipidemia type Intolerant to statins, but doing well with ezetimibe.  - Comprehensive metabolic panel - Lipid panel  The entirety of the information documented in the History of Present Illness, Review of Systems and Physical Exam were personally obtained by me. Portions of this information were initially documented by RosMeyer CoryMA and reviewed by me for thoroughness and accuracy.      DonLelon HuhD  [facility]

## 2019-04-10 NOTE — Patient Instructions (Addendum)
.   Please review the attached list of medications and notify my office if there are any errors.   . Please bring all of your medications to every appointment so we can make sure that our medication list is the same as yours.   . We will have flu vaccines available after Labor Day. Please go to your pharmacy or call the office in early September to schedule you flu shot.   Take gabapentin 300mg  capsule before bedtime, and gabapentin 100mg  in the morning

## 2019-04-11 LAB — TSH: TSH: 0.828 u[IU]/mL (ref 0.450–4.500)

## 2019-04-11 LAB — COMPREHENSIVE METABOLIC PANEL
ALT: 34 IU/L — ABNORMAL HIGH (ref 0–32)
AST: 46 IU/L — ABNORMAL HIGH (ref 0–40)
Albumin/Globulin Ratio: 1.2 (ref 1.2–2.2)
Albumin: 4.2 g/dL (ref 3.6–4.6)
Alkaline Phosphatase: 67 IU/L (ref 39–117)
BUN/Creatinine Ratio: 14 (ref 12–28)
BUN: 12 mg/dL (ref 8–27)
Bilirubin Total: 0.6 mg/dL (ref 0.0–1.2)
CO2: 25 mmol/L (ref 20–29)
Calcium: 10.2 mg/dL (ref 8.7–10.3)
Chloride: 104 mmol/L (ref 96–106)
Creatinine, Ser: 0.86 mg/dL (ref 0.57–1.00)
GFR calc Af Amer: 72 mL/min/{1.73_m2} (ref >59)
GFR calc non Af Amer: 62 mL/min/{1.73_m2} (ref >59)
Globulin, Total: 3.4 g/dL (ref 1.5–4.5)
Glucose: 115 mg/dL — ABNORMAL HIGH (ref 65–99)
Potassium: 4 mmol/L (ref 3.5–5.2)
Sodium: 143 mmol/L (ref 134–144)
Total Protein: 7.6 g/dL (ref 6.0–8.5)

## 2019-04-11 LAB — VITAMIN B12: Vitamin B-12: 563 pg/mL (ref 232–1245)

## 2019-04-11 LAB — LIPID PANEL
Chol/HDL Ratio: 4.8 ratio — ABNORMAL HIGH (ref 0.0–4.4)
Cholesterol, Total: 187 mg/dL (ref 100–199)
HDL: 39 mg/dL — ABNORMAL LOW (ref >39)
LDL Calculated: 128 mg/dL — ABNORMAL HIGH (ref 0–99)
Triglycerides: 98 mg/dL (ref 0–149)
VLDL Cholesterol Cal: 20 mg/dL (ref 5–40)

## 2019-04-12 ENCOUNTER — Telehealth: Payer: Self-pay

## 2019-04-12 NOTE — Telephone Encounter (Signed)
-----   Message from Birdie Sons, MD sent at 04/12/2019  8:49 AM EDT ----- Cholesterol is high, but is much better, down from 222 to 187. Continue current dose of ezetimibe and all other medications. Follow up in February as scheduled.

## 2019-04-12 NOTE — Telephone Encounter (Signed)
Pt's daughter Stephens November) advised.     Thanks,   -Mickel Baas

## 2019-06-01 ENCOUNTER — Other Ambulatory Visit: Payer: Self-pay | Admitting: Family Medicine

## 2019-06-01 DIAGNOSIS — E1149 Type 2 diabetes mellitus with other diabetic neurological complication: Secondary | ICD-10-CM

## 2019-06-01 NOTE — Telephone Encounter (Signed)
[facility] faxed refill request for the following medications:  gabapentin (NEURONTIN) 100 MG capsule  ezetimibe (ZETIA) 10 MG tablet   Please advise.

## 2019-06-03 MED ORDER — EZETIMIBE 10 MG PO TABS: 10.0000 mg | ORAL_TABLET | Freq: Every day | ORAL | 3 refills | Status: DC

## 2019-06-03 MED ORDER — GABAPENTIN 100 MG PO CAPS: 100.0000 mg | ORAL_CAPSULE | ORAL | 3 refills | Status: DC

## 2019-06-13 ENCOUNTER — Ambulatory Visit: Payer: Medicare HMO

## 2019-06-14 ENCOUNTER — Other Ambulatory Visit: Payer: Self-pay

## 2019-06-14 ENCOUNTER — Ambulatory Visit (INDEPENDENT_AMBULATORY_CARE_PROVIDER_SITE_OTHER): Payer: Medicare HMO

## 2019-06-14 DIAGNOSIS — Z23 Encounter for immunization: Secondary | ICD-10-CM | POA: Diagnosis not present

## 2019-08-15 ENCOUNTER — Ambulatory Visit (INDEPENDENT_AMBULATORY_CARE_PROVIDER_SITE_OTHER): Payer: Medicare HMO

## 2019-08-15 ENCOUNTER — Other Ambulatory Visit: Payer: Self-pay

## 2019-08-15 ENCOUNTER — Telehealth: Payer: Self-pay

## 2019-08-15 DIAGNOSIS — Z0000 Encounter for general adult medical examination without abnormal findings: Secondary | ICD-10-CM | POA: Diagnosis not present

## 2019-08-15 NOTE — Progress Notes (Signed)
Subjective:   [name] [name] is a 85 y.o. female who presents for Medicare Annual (Subsequent) preventive examination.    This visit is being conducted through telemedicine due to the COVID-19 pandemic. This patient has given me verbal consent via doximity to conduct this visit, patient states they are participating from their home address. Some vital signs may be absent or patient reported.    Patient identification: identified by name, DOB, and current address  Review of Systems:  N/A  Cardiac Risk Factors include: advanced age (>23mn, >>34women);diabetes mellitus;dyslipidemia;hypertension     Objective:     Vitals: There were no vitals taken for this visit.  There is no height or weight on file to calculate BMI. Unable to obtain vitals due to visit being conducted via telephonically.   Advanced Directives 11/08/2017 07/06/2017 05/27/2015  Does Patient Have a Medical Advance Directive? No No No  Would patient like information on creating a medical advance directive? Yes (MAU/Ambulatory/Procedural Areas - Information given) - -    Tobacco Social History   Tobacco Use  Smoking Status Never Smoker  Smokeless Tobacco Never Used     Counseling given: Not Answered   Clinical Intake:  Pre-visit preparation completed: Yes  Pain : No/denies pain Pain Score: 0-No pain     Nutritional Risks: None Diabetes: Yes  How often do you need to have someone help you when you read instructions, pamphlets, or other written materials from your doctor or pharmacy?: 1 - Never   Diabetes:  Is the patient diabetic?  Yes type 2 If diabetic, was a CBG obtained today?  Yes  Did the patient bring in their glucometer from home?  Yes  How often do you monitor your CBG's? Once a day in AM.   Financial Strains and Diabetes Management:  Are you having any financial strains with the device, your supplies or your medication? No .  Does the patient want to be seen by Chronic Care  Management for management of their diabetes?  No  Would the patient like to be referred to a Nutritionist or for Diabetic Management?  No   Diabetic Exams:  Diabetic Eye Exam: Completed 12/10/17. Overdue for diabetic eye exam. Pt has been advised about the importance in completing this exam.   Diabetic Foot Exam: Completed 09/26/13. Pt has been advised about the importance in completing this exam. Note made to follow up on this at next in office visit.     Interpreter Needed?: No  Information [facility] :: [facility]  Past Medical History:  Diagnosis Date  . Allergy   . Arthritis   . [place]    th[place]  . [place]   . Hyperlipidemia   . Hypertension   . Thyroid disease    Past Surgical History:  Procedure Laterality Date  . ABDOMINAL HYSTERECTOMY    . BREAST BIOPSY Right ?   benign  . THYROIDECTOMY, PARTIAL  1994  . TONSILLECTOMY     Family History  Problem Relation Age of Onset  . Cancer Daughter        breast  . Diabetes Son    Social History   Socioeconomic History  . Marital status: Married    Spouse name: Not on file  . Number of children: 3  . Years of education: Not on file  . Highest education level: 10th grade  Occupational History  . Occupation: Retired   SScientific laboratory technician . Financial resource strain: Not hard at  all  . Food insecurity    Worry: Never true    Inability: Never true  . Transportation needs    Medical: No    Non-medical: No  Tobacco Use  . Smoking status: Never Smoker  . Smokeless tobacco: Never Used  Substance and Sexual Activity  . Alcohol use: No    Alcohol/week: 0.0 standard drinks  . Drug use: No  . Sexual activity: Not on file  Lifestyle  . Physical activity    Days per week: Not on file    Minutes per session: Not on file  . Stress: Not at all  Relationships  . Social Herbalist on phone: Patient refused    Gets together: Patient refused    Attends religious  service: Patient refused    Active member of club or organization: Patient refused    Attends meetings of clubs or organizations: Patient refused    Relationship status: Patient refused  Other Topics Concern  . Not on file  Social History Narrative  . Not on file    Outpatient Encounter Medications as of 08/15/2019  Medication Sig  . Accu-Chek Softclix Lancets lancets USE TO CHECK BLOOD SUGAR ONCE A DAY  . Alcohol Swabs (B-D SINGLE USE SWABS REGULAR) PADS USE TEST BLOOD SUGAR EVERY DAY  . amLODipine (NORVASC) 5 MG tablet TAKE 1 TABLET EVERY DAY  . aspirin 81 MG tablet Take 1 tablet by mouth daily.  . Blood Glucose Monitoring Suppl (ACCU-CHEK AVIVA PLUS) w/Device KIT USE TO CHECK BLOOD SUGAR ONCE A DAY  . cholecalciferol (VITAMIN D) 1000 UNITS tablet Take 1 tablet by mouth daily.  Marland Kitchen ezetimibe (ZETIA) 10 MG tablet Take 1 tablet (10 mg total) by mouth daily.  . fluticasone (FLONASE) 50 MCG/ACT nasal spray PLACE 2 SPRAYS INTO BOTH NOSTRILS DAILY.  . furosemide (LASIX) 20 MG tablet TAKE 1 TABLET EVERY DAY  . gabapentin (NEURONTIN) 100 MG capsule Take 1 capsule (100 mg total) by mouth every morning.  . gabapentin (NEURONTIN) 300 MG capsule Take 1 capsule (300 mg total) by mouth at bedtime. In addition to the 141m capsule in the morning  . glipiZIDE (GLUCOTROL XL) 10 MG 24 hr tablet TAKE 1 TABLET (10 MG TOTAL) BY MOUTH DAILY.  .Marland Kitchenglucose blood (ACCU-CHEK AVIVA PLUS) test strip TEST BLOOD SUGAR ONE TIME DAILY  . levothyroxine (SYNTHROID) 75 MCG tablet TAKE 1 TABLET EVERY DAY  . metFORMIN (GLUCOPHAGE-XR) 750 MG 24 hr tablet TAKE 1 TABLET TWICE DAILY  . Omega-3 Fatty Acids (FISH OIL) 1000 MG CAPS Take 1 capsule by mouth daily.  .Marland Kitchenoxybutynin (DITROPAN) 5 MG tablet TAKE 1 TABLET  2  TO  3  TIMES  DAILY AS NEEDED  FOR  URINARY FREQUENCY  . quinapril (ACCUPRIL) 40 MG tablet TAKE 2 TABLETS EVERY DAY  . vitamin B-12 (CYANOCOBALAMIN) 100 MCG tablet Take 1,000 mcg by mouth daily.   . solifenacin  (VESICARE) 5 MG tablet Take 1 tablet (5 mg total) by mouth daily. (Patient not taking: Reported on 08/15/2019)   No facility-administered encounter medications on file as of 08/15/2019.     Activities of Daily Living In your present state of health, do you have any difficulty performing the following activities: 08/15/2019  Hearing? N  Vision? N  Difficulty concentrating or making decisions? Y  Comment Slightly due to age.  Walking or climbing stairs? N  Comment Avoids steps.  Dressing or bathing? N  Doing errands, shopping? Y  Comment Does not drive.  Preparing Food and eating ? Y  Comment Pt does not cook.  Using the Toilet? N  In the past six months, have you accidently leaked urine? Y  Comment Wears depends at all time.  Do you have problems with loss of bowel control? N  Managing your Medications? N  Managing your Finances? Y  Comment Daughter manages finances.  Housekeeping or managing your Housekeeping? Y  Comment Daughter does the cooking and cleaning.  Some recent data might be hidden    Patient Care Team: Birdie Sons, MD as PCP - General (Family Medicine) Sharlet Salina, MD as Referring Physician (Physical Medicine and Rehabilitation)    Assessment:   This is a routine wellness examination for [name].  Exercise Activities and Dietary recommendations Current Exercise Habits: The patient does not participate in regular exercise at present, Exercise limited by: respiratory conditions(s);orthopedic condition(s)  Goals    . DIET - REDUCE FRIED FOOD     Recommend avoiding fried foods when having to eat out. Daughter is looking into a food service with healthier food.     . Increase water intake     Continue drinking 8 glasses of water a day.        Fall Risk: Fall Risk  08/15/2019 11/08/2017 07/06/2017 02/26/2017 08/25/2016  Falls in the past year? 0 No No No No  Number falls in past yr: 0 - - - -  Injury with Fall? 0 - - - -  Risk for fall due to : - - -  Impaired balance/gait -    FALL RISK PREVENTION PERTAINING TO THE HOME:  Any stairs in or around the home? Yes  If so, are there any without handrails? No   Home free of loose throw rugs in walkways, pet beds, electrical cords, etc? Yes  Adequate lighting in your home to reduce risk of falls? Yes   ASSISTIVE DEVICES UTILIZED TO PREVENT FALLS:  Life alert? No  Use of a cane, walker or w/c? Yes  Grab bars in the bathroom? Yes  Shower chair or bench in shower? Yes  Elevated toilet seat or a handicapped toilet? Yes   TIMED UP AND GO:  Was the test performed? No .    Depression Screen PHQ 2/9 Scores 08/15/2019 11/08/2017 07/06/2017 08/25/2016  PHQ - 2 Score 0 2 0 0  PHQ- 9 Score - 5 - -     Cognitive Function: Declined today.      6CIT Screen 08/25/2016  What Year? 0 points  What month? 0 points  What time? 0 points  Count back from 20 0 points  Months in reverse 0 points  Repeat phrase 4 points  Total Score 4    Immunization History  Administered Date(s) Administered  . Fluad Quad(high Dose 65+) 06/14/2019  . Influenza, High Dose Seasonal PF 05/27/2015, 06/24/2016, 07/15/2017, 07/30/2018  . Pneumococcal Conjugate-13 08/07/2014  . Pneumococcal Polysaccharide-23 12/31/2000  . Td 02/19/1998  . Tdap 06/20/2011  . Zoster 12/13/2007    Qualifies for Shingles Vaccine? Yes  Zostavax completed 04/21/17. Due for Shingrix. Education has been provided regarding the importance of this vaccine. Pt has been advised to call insurance company to determine out of pocket expense. Advised may also receive vaccine at local pharmacy or Health Dept. Verbalized acceptance and understanding.   Up to date  Flu Vaccine: Up to date  Pneumococcal Vaccine: Completed series  Screening Tests Health Maintenance  Topic Date Due  . FOOT EXAM  10/25/1942  . OPHTHALMOLOGY EXAM  12/11/2018  . MAMMOGRAM  08/30/2019  . HEMOGLOBIN A1C  10/11/2019  . DEXA SCAN  04/21/2020  . TETANUS/TDAP   06/19/2021  . INFLUENZA VACCINE  Completed  . PNA vac Low Risk Adult  Completed    Cancer Screenings:  Colorectal Screening: No longer required.   Mammogram: No longer required.   Bone Density: Completed 04/21/17. Results reflect OSTEOPENIA. Repeat every 3 years.   Lung Cancer Screening: (Low Dose CT Chest recommended if Age 77-80 years, 30 pack-year currently smoking OR have quit w/in 15years.) does not qualify.   Additional Screening:  Dental Screening: Recommended annual dental exams for proper oral hygiene   Community Resource Referral:  CRR required this visit?  No       Plan:  I have personally reviewed and addressed the Medicare Annual Wellness questionnaire and have noted the following in the patient's chart:  A. Medical and social history B. Use of alcohol, tobacco or illicit drugs  C. Current medications and supplements D. Functional ability and status E.  Nutritional status F.  Physical activity G. Advance directives H. List of other physicians I.  Hospitalizations, surgeries, and ER visits in previous 12 months J.  [place] such as hearing and vision if needed, cognitive and depression L. Referrals and appointments   In addition, I have reviewed and discussed with patient certain preventive protocols, quality metrics, and best practice recommendations. A written personalized care plan for preventive services as well as general preventive health recommendations were provided to patient.   Glendora Score, [place]  00/02/3493 Nurse Health Advisor   Nurse Notes: Pt needs a diabetic foot exam at next in office apt. Pt to call and schedule an eye exam.

## 2019-08-15 NOTE — Patient Instructions (Signed)
[name] [name] , Thank you for taking time to come for your Medicare Wellness Visit. I appreciate your ongoing commitment to your health goals. Please review the following plan we discussed and let me know if I can assist you in the future.   Screening recommendations/referrals: Colonoscopy: No longer required.  Mammogram: No longer required.  Bone Density: Up to date, due 04/2020 Recommended yearly ophthalmology/optometry visit for glaucoma screening and checkup Recommended yearly dental visit for hygiene and checkup  Vaccinations: Influenza vaccine: Up to date Pneumococcal vaccine: Completed series Tdap vaccine: Up to date, due 06/2021 Shingles vaccine: Pt declines today.     Advanced directives: Please bring a copy of your POA (Power of Attorney) and/or Living Will to your next appointment.   Conditions/risks identified: Recommend to continue to work towards eating a healthy diet and picking better options for meals. [name]   Next appointment: 10/16/19 @ 11:00 AM with Dr Caryn Section. Declined scheduling an AWV for 2021 at this time.    Preventive Care 11 Years and Older, Female Preventive care refers to lifestyle choices and visits with your health care provider that can promote health and wellness. What does preventive care include?  A yearly physical exam. This is also called an annual well check.  Dental exams once or twice a year.  Routine eye exams. Ask your health care provider how often you should have your eyes checked.  Personal lifestyle choices, including:  Daily care of your teeth and gums.  Regular physical activity.  Eating a healthy diet.  Avoiding tobacco and drug use.  Limiting alcohol use.  Practicing safe sex.  Taking low-dose aspirin every day.  Taking vitamin and mineral supplements as recommended by your health care provider. What happens during an annual well check? The services and screenings done by your health  care provider during your annual well check will depend on your age, overall health, lifestyle risk factors, and family history of disease. Counseling  Your health care provider may ask you questions about your:  Alcohol use.  Tobacco use.  Drug use.  Emotional well-being.  Home and relationship well-being.  Sexual activity.  Eating habits.  History of falls.  Memory and ability to understand (cognition).  Work and work Statistician.  Reproductive health. Screening  You may have the following tests or measurements:  Height, weight, and BMI.  Blood pressure.  Lipid and cholesterol levels. These may be checked every 5 years, or more frequently if you are over 56 years old.  Skin check.  Lung cancer screening. You may have this screening every year starting at age 46 if you have a 30-pack-year history of smoking and currently smoke or have quit within the past 15 years.  Fecal occult blood test (FOBT) of the stool. You may have this test every year starting at age 36.  Flexible sigmoidoscopy or colonoscopy. You may have a sigmoidoscopy every 5 years or a colonoscopy every 10 years starting at age 44.  Hepatitis C blood test.  Hepatitis B blood test.  Sexually transmitted disease (STD) testing.  Diabetes screening. This is done by checking your blood sugar (glucose) after you have not eaten for a while (fasting). You may have this done every 1-3 years.  Bone density scan. This is done to screen for osteoporosis. You may have this done starting at age 108.  Mammogram. This may be done every 1-2 years. Talk to your health care provider about how often you should have regular  mammograms. Talk with your health care provider about your test results, treatment options, and if necessary, the need for more tests. Vaccines  Your health care provider may recommend certain vaccines, such as:  Influenza vaccine. This is recommended every year.  Tetanus, diphtheria, and  acellular pertussis (Tdap, Td) vaccine. You may need a Td booster every 10 years.  Zoster vaccine. You may need this after age 104.  Pneumococcal 13-valent conjugate (PCV13) vaccine. One dose is recommended after age 75.  Pneumococcal polysaccharide (PPSV23) vaccine. One dose is recommended after age 8. Talk to your health care provider about which screenings and vaccines you need and how often you need them. This information is not intended to replace advice given to you by your health care provider. Make sure you discuss any questions you have with your health care provider. Document Released: 09/27/2015 Document Revised: 05/20/2016 Document Reviewed: 07/02/2015 Elsevier Interactive Patient Education  2017 [place] Prevention in the Home Falls can cause injuries. They can happen to people of all ages. There are many things you can do to make your home safe and to help prevent falls. What can I do on the outside of my home?  Regularly fix the edges of walkways and driveways and fix any cracks.  Remove anything that might make you trip as you walk through a door, such as a raised step or threshold.  Trim any bushes or trees on the path to your home.  Use bright outdoor lighting.  Clear any walking paths of anything that might make someone trip, such as rocks or tools.  Regularly check to see if handrails are loose or broken. Make sure that both sides of any steps have handrails.  Any raised decks and porches should have guardrails on the edges.  Have any leaves, snow, or ice cleared regularly.  Use sand or salt on walking paths during winter.  Clean up any spills in your garage right away. This includes oil or grease spills. What can I do in the bathroom?  Use night lights.  Install grab bars by the toilet and in the tub and shower. Do not use towel bars as grab bars.  Use non-skid mats or decals in the tub or shower.  If you need to sit down in the shower, use  a plastic, non-slip stool.  Keep the floor dry. Clean up any water that spills on the floor as soon as it happens.  Remove soap buildup in the tub or shower regularly.  Attach bath mats securely with double-sided non-slip rug tape.  Do not have throw rugs and other things on the floor that can make you trip. What can I do in the bedroom?  Use night lights.  Make sure that you have a light by your bed that is easy to reach.  Do not use any sheets or blankets that are too big for your bed. They should not hang down onto the floor.  Have a firm chair that has side arms. You can use this for support while you get dressed.  Do not have throw rugs and other things on the floor that can make you trip. What can I do in the kitchen?  Clean up any spills right away.  Avoid walking on wet floors.  Keep items that you use a lot in easy-to-reach places.  If you need to reach something above you, use a strong step stool that has a grab bar.  Keep electrical cords out of the way.  Do not use floor polish or wax that makes floors slippery. If you must use wax, use non-skid floor wax.  Do not have throw rugs and other things on the floor that can make you trip. What can I do with my stairs?  Do not leave any items on the stairs.  Make sure that there are handrails on both sides of the stairs and use them. Fix handrails that are broken or loose. Make sure that handrails are as long as the stairways.  Check any carpeting to make sure that it is firmly attached to the stairs. Fix any carpet that is loose or worn.  Avoid having throw rugs at the top or bottom of the stairs. If you do have throw rugs, attach them to the floor with carpet tape.  Make sure that you have a light switch at the top of the stairs and the bottom of the stairs. If you do not have them, ask someone to add them for you. What else can I do to help prevent falls?  Wear shoes that:  Do not have high heels.  Have  rubber bottoms.  Are comfortable and fit you well.  Are closed at the toe. Do not wear sandals.  If you use a stepladder:  Make sure that it is fully opened. Do not climb a closed stepladder.  Make sure that both sides of the stepladder are locked into place.  Ask someone to hold it for you, if possible.  Clearly mark and make sure that you can see:  Any grab bars or handrails.  First and last steps.  Where the edge of each step is.  Use tools that help you move around (mobility aids) if they are needed. These include:  Canes.  Walkers.  Scooters.  Crutches.  Turn on the lights when you go into a dark area. Replace any light bulbs as soon as they burn out.  Set up your furniture so you have a clear path. Avoid moving your furniture around.  If any of your floors are uneven, fix them.  If there are any pets around you, be aware of where they are.  Review your medicines with your doctor. Some medicines can make you feel dizzy. This can increase your chance of falling. Ask your doctor what other things that you can do to help prevent falls. This information is not intended to replace advice given to you by your health care provider. Make sure you discuss any questions you have with your health care provider. Document Released: 06/27/2009 Document Revised: 02/06/2016 Document Reviewed: 10/05/2014 Elsevier Interactive Patient Education  2017 [employer].

## 2019-08-15 NOTE — Telephone Encounter (Signed)
Spoke with pt and daughter today to complete the AWV telephonically. Daughter inquired about a prescription for a b/p cuff. Daughter states at last in office visit, PCP was going to send in a prescription to [facility] for pt to have a cuff at home. A b/p cuff is covered by insurance. I do not see where a cuff was previously sent in. Please advise, thanks.

## 2019-08-18 MED ORDER — BLOOD PRESSURE MONITOR/ARM DEVI: 0 refills | Status: DC

## 2019-08-18 NOTE — Telephone Encounter (Signed)
Prescription has been sent to [facility]

## 2019-09-06 ENCOUNTER — Ambulatory Visit: Payer: Self-pay

## 2019-09-06 NOTE — Telephone Encounter (Signed)
  Patients daughter called and wanted to review all her mothers medications Doses and reason and amounts read to Ms Stephens November from Medication list in chart.  She needed an up to date list and hepling her mother.  She verbalized understanding of all information. Reason for Disposition . Caller has medication question only, adult not sick, and triager answers question  Answer Assessment - Initial Assessment Questions 1.   NAME of MEDICATION: "What medicine are you calling about?"     Review medication 2.   QUESTION: "What is your question?"     What medications and when does my mother take them 3.   PRESCRIBING HCP: "Who prescribed it?" Reason: if prescribed by specialist, call should be referred to that group.    Dr Caryn Section 4. SYMPTOMS: "Do you have any symptoms?"    None just trying to help her mother 5. SEVERITY: If symptoms are present, ask "Are they mild, moderate or severe?"    None 6.  PREGNANCY:  "Is there any chance that you are pregnant?" "When was your last menstrual period?"   N/A  Protocols used: MEDICATION QUESTION CALL-A-AH

## 2019-09-25 ENCOUNTER — Other Ambulatory Visit: Payer: Self-pay | Admitting: Family Medicine

## 2019-09-25 DIAGNOSIS — E1149 Type 2 diabetes mellitus with other diabetic neurological complication: Secondary | ICD-10-CM

## 2019-09-25 NOTE — Telephone Encounter (Signed)
Pt's daughter called in to request a refill for all of pt's current medications. She said that they are currently having a issue with mail order and would like to know if provider could send in refills to local pharmacy meanwhile?    Please advise.

## 2019-09-26 MED ORDER — OXYBUTYNIN CHLORIDE 5 MG PO TABS: ORAL_TABLET | ORAL | 4 refills | Status: DC

## 2019-09-26 MED ORDER — METFORMIN HCL ER 750 MG PO TB24: 750.0000 mg | ORAL_TABLET | Freq: Two times a day (BID) | ORAL | 4 refills | Status: DC

## 2019-09-26 MED ORDER — FUROSEMIDE 20 MG PO TABS: 20.0000 mg | ORAL_TABLET | Freq: Every day | ORAL | 4 refills | Status: DC

## 2019-09-26 MED ORDER — LEVOTHYROXINE SODIUM 75 MCG PO TABS: 75.0000 ug | ORAL_TABLET | Freq: Every day | ORAL | 4 refills | Status: DC

## 2019-09-26 MED ORDER — EZETIMIBE 10 MG PO TABS: 10.0000 mg | ORAL_TABLET | Freq: Every day | ORAL | 3 refills | Status: DC

## 2019-09-26 MED ORDER — QUINAPRIL HCL 40 MG PO TABS: 80.0000 mg | ORAL_TABLET | Freq: Every day | ORAL | 4 refills | Status: DC

## 2019-09-26 MED ORDER — AMLODIPINE BESYLATE 5 MG PO TABS: 5.0000 mg | ORAL_TABLET | Freq: Every day | ORAL | 4 refills | Status: DC

## 2019-09-26 MED ORDER — GLIPIZIDE ER 10 MG PO TB24: 10.0000 mg | ORAL_TABLET | Freq: Every day | ORAL | 4 refills | Status: DC

## 2019-09-26 MED ORDER — GABAPENTIN 300 MG PO CAPS: 300.0000 mg | ORAL_CAPSULE | Freq: Every day | ORAL | 4 refills | Status: DC

## 2019-09-26 NOTE — Telephone Encounter (Signed)
Please sent to [facility] on [address]

## 2019-10-16 ENCOUNTER — Encounter: Payer: Self-pay | Admitting: Family Medicine

## 2019-10-16 ENCOUNTER — Other Ambulatory Visit: Payer: Self-pay

## 2019-10-16 ENCOUNTER — Ambulatory Visit: Payer: Self-pay | Admitting: Family Medicine

## 2019-10-16 ENCOUNTER — Ambulatory Visit (INDEPENDENT_AMBULATORY_CARE_PROVIDER_SITE_OTHER): Payer: Medicare HMO | Admitting: Family Medicine

## 2019-10-16 VITALS — BP 140/76 | HR 68 | Temp 97.0°F | Resp 15 | Wt 178.2 lb

## 2019-10-16 DIAGNOSIS — E538 Deficiency of other specified B group vitamins: Secondary | ICD-10-CM | POA: Diagnosis not present

## 2019-10-16 DIAGNOSIS — E1149 Type 2 diabetes mellitus with other diabetic neurological complication: Secondary | ICD-10-CM

## 2019-10-16 DIAGNOSIS — I10 Essential (primary) hypertension: Secondary | ICD-10-CM | POA: Diagnosis not present

## 2019-10-16 LAB — POCT GLYCOSYLATED HEMOGLOBIN (HGB A1C): Hemoglobin A1C: 6.6 % — AB (ref 4.0–5.6)

## 2019-10-16 NOTE — Progress Notes (Signed)
Patient: [name] [name] Female    DOB: 02-06-1933   86 y.o.   MRN: [id number] Visit Date: 10/16/2019  Today's Provider: Lelon Huh, MD   Chief Complaint  Patient presents with  . Diabetes  . Hypertension  . Hyperlipidemia  . Hypothyroidism   Subjective:     HPI  Diabetes Mellitus Type II, Follow-up:   Lab Results  Component Value Date   HGBA1C 6.6 (A) 10/16/2019   HGBA1C 7.2 (A) 04/10/2019   HGBA1C 6.6 (A) 10/10/2018   Last seen for diabetes 6 months ago.  Management since then includes having patient take 315m of Gabapentin at night, and just 1062mevery morning. She reports excellent compliance with treatment. She is not having side effects.  Current symptoms include polyuria and visual disturbances and have been unchanged. Home blood sugar records: not being checked regularly   Current Insulin Regimen: none Most Recent Eye Exam: not UTD Weight trend: stable Prior visit with dietician: no Current diet: well balanced Current exercise: none  She does report that her feet burn, is taking 10057mabapentin in the morning and 300 at night. Cannot tolerate higher dosages due to sedation.   ------------------------------------------------------------------------   Hypertension, follow-up:  BP Readings from Last 3 Encounters:  10/16/19 140/76  04/10/19 (!) 143/76  10/10/18 140/70    She was last seen for hypertension 6 months ago.  BP at that visit was 143/76. Management since that visit includes no changes.She reports excellent compliance with treatment. She is not having side effects.  She is not exercising. She is adherent to low salt diet.   Outside blood pressures are  Not being checked She is experiencing lower extremity edema and near-syncope.  Patient denies chest pain, chest pressure/discomfort, claudication, dyspnea, exertional chest pressure/discomfort, fatigue, irregular heart beat, orthopnea, palpitations, paroxysmal nocturnal dyspnea,  syncope and tachypnea.   Cardiovascular risk factors include advanced age (older than 55 39r men, 65 44r women), diabetes mellitus, dyslipidemia and hypertension.  Use of agents associated with hypertension: NSAIDS and thyroid hormones.   ------------------------------------------------------------------------    Lipid/Cholesterol, Follow-up:   Last seen for this 6 months ago.  Management since that visit includes no changes.  Last Lipid Panel:    Component Value Date/Time   CHOL 187 04/10/2019 1146   TRIG 98 04/10/2019 1146   HDL 39 (L) 04/10/2019 1146   CHOLHDL 4.8 (H) 04/10/2019 1146   LDLCALC 128 (H) 04/10/2019 1146    She reports excellent compliance with treatment. She is not having side effects.   Wt Readings from Last 3 Encounters:  10/16/19 178 lb 3.2 oz (80.8 kg)  04/10/19 176 lb (79.8 kg)  10/10/18 176 lb (79.8 kg)    ------------------------------------------------------------------------  Follow up for Hypothyroidism:  The patient was last seen for this 6 months ago. Changes made at last visit include none.  She reports excellent compliance with treatment. She feels that condition is Unchanged. She is not having side effects.   ------------------------------------------------------------------------------------  Allergies  Allergen Reactions  . Lovastatin     Weakness  . Pravastatin Sodium     Muscle weakness  . Simvastatin     Fatigue     Current Outpatient Medications:  .  Accu-Chek Softclix Lancets lancets, USE TO CHECK BLOOD SUGAR ONCE A DAY, Disp: 100 each, Rfl: 4 .  Alcohol Swabs (B-D SINGLE USE SWABS REGULAR) PADS, USE TEST BLOOD SUGAR EVERY DAY, Disp: 100 each, Rfl: 4 .  amLODipine (NORVASC) 5 MG tablet, Take 1  tablet (5 mg total) by mouth daily., Disp: 90 tablet, Rfl: 4 .  aspirin 81 MG tablet, Take 1 tablet by mouth daily., Disp: , Rfl:  .  Blood Glucose Monitoring Suppl (ACCU-CHEK AVIVA PLUS) w/Device KIT, USE TO CHECK BLOOD SUGAR  ONCE A DAY, Disp: 1 kit, Rfl: 0 .  Blood Pressure Monitoring (BLOOD PRESSURE MONITOR/ARM) DEVI, Use to check blood pressure of upper arm daily as needed for hypertension (I10), Disp: 1 each, Rfl: 0 .  cholecalciferol (VITAMIN D) 1000 UNITS tablet, Take 1 tablet by mouth daily., Disp: , Rfl:  .  ezetimibe (ZETIA) 10 MG tablet, Take 1 tablet (10 mg total) by mouth daily., Disp: 90 tablet, Rfl: 3 .  fluticasone (FLONASE) 50 MCG/ACT nasal spray, PLACE 2 SPRAYS INTO BOTH NOSTRILS DAILY., Disp: 48 g, Rfl: 3 .  furosemide (LASIX) 20 MG tablet, Take 1 tablet (20 mg total) by mouth daily., Disp: 90 tablet, Rfl: 4 .  gabapentin (NEURONTIN) 100 MG capsule, Take 1 capsule (100 mg total) by mouth every morning., Disp: 90 capsule, Rfl: 3 .  gabapentin (NEURONTIN) 300 MG capsule, Take 1 capsule (300 mg total) by mouth at bedtime. In addition to the 114m capsule in the morning, Disp: 90 capsule, Rfl: 4 .  glipiZIDE (GLUCOTROL XL) 10 MG 24 hr tablet, Take 1 tablet (10 mg total) by mouth daily., Disp: 90 tablet, Rfl: 4 .  glucose blood (ACCU-CHEK AVIVA PLUS) test strip, TEST BLOOD SUGAR ONE TIME DAILY, Disp: 100 each, Rfl: 4 .  levothyroxine (SYNTHROID) 75 MCG tablet, Take 1 tablet (75 mcg total) by mouth daily., Disp: 90 tablet, Rfl: 4 .  metFORMIN (GLUCOPHAGE-XR) 750 MG 24 hr tablet, Take 1 tablet (750 mg total) by mouth 2 (two) times daily., Disp: 180 tablet, Rfl: 4 .  Omega-3 Fatty Acids (FISH OIL) 1000 MG CAPS, Take 1 capsule by mouth daily., Disp: , Rfl:  .  oxybutynin (DITROPAN) 5 MG tablet, TAKE 1 TABLET  2  TO  3  TIMES  DAILY AS NEEDED  FOR  URINARY FREQUENCY, Disp: 180 tablet, Rfl: 4 .  quinapril (ACCUPRIL) 40 MG tablet, Take 2 tablets (80 mg total) by mouth daily., Disp: 180 tablet, Rfl: 4 .  solifenacin (VESICARE) 5 MG tablet, Take 1 tablet (5 mg total) by mouth daily., Disp: 90 tablet, Rfl: 2 .  vitamin B-12 (CYANOCOBALAMIN) 100 MCG tablet, Take 1,000 mcg by mouth daily. , Disp: , Rfl:   Review of  Systems  Constitutional: Negative for appetite change, chills, fatigue and fever.  HENT: Positive for congestion and postnasal drip.   Respiratory: Positive for cough. Negative for chest tightness and shortness of breath.   Cardiovascular: Negative for chest pain and palpitations.  Gastrointestinal: Negative for abdominal pain, nausea and vomiting.  Neurological: Negative for dizziness and weakness.    Social History   Tobacco Use  . Smoking status: Never Smoker  . Smokeless tobacco: Never Used  Substance Use Topics  . Alcohol use: No    Alcohol/week: 0.0 standard drinks      Objective:   BP 140/76   Pulse 68   Temp (!) 97 F (36.1 C) (Oral)   Resp 15   Wt 178 lb 3.2 oz (80.8 kg)   SpO2 98%   BMI 30.59 kg/m  Vitals:   10/16/19 1055  BP: 140/76  Pulse: 68  Resp: 15  Temp: (!) 97 F (36.1 C)  TempSrc: Oral  SpO2: 98%  Weight: 178 lb 3.2 oz (80.8 kg)  Body  mass index is 30.59 kg/m.   Physical Exam   General: Appearance:    Overweight female in no acute distress  Eyes:    PERRL, conjunctiva/corneas clear, EOM's intact       Lungs:     Clear to auscultation bilaterally, respirations unlabored  Heart:    Normal heart rate. Normal rhythm. No murmurs, rubs, or gallops.   MS:   All extremities are intact.   Neurologic:   Awake, alert, oriented x 3. No apparent focal neurological           defect.        Results for orders placed or performed in visit on 10/16/19  POCT HgB A1C  Result Value Ref Range   Hemoglobin A1C 6.6 (A) 4.0 - 5.6 %   HbA1c POC (<> result, manual entry)     HbA1c, POC (prediabetic range)     HbA1c, POC (controlled diabetic range)         Assessment & Plan     1. Diabetes mellitus type 2 with neurological manifestations ([place]) Well controlled.  Continue current medications.  Recommend she try adding alpha-lipoic acid to regiment for neuropathy  2. Essential hypertension Well controlled.  Continue current medications.    3. B12  deficiency Continue OTC b12 supplement.   Follow up in July 2021.   The entirety of the information documented in the History of Present Illness, Review of Systems and Physical Exam were personally obtained by me. Portions of this information were initially documented by Minette Headland, CMA and reviewed by me for thoroughness and accuracy.      Lelon Huh, MD  [facility]

## 2019-10-16 NOTE — Patient Instructions (Addendum)
.   Please review the attached list of medications and notify my office if there are any errors.   . Please bring all of your medications to every appointment so we can make sure that our medication list is the same as yours.   . Please contact your eyecare professional to schedule a routine eye exam   Try taking OTC alpha-lipoic acid 600mg  every day to help with nerve pain in feet

## 2019-10-28 ENCOUNTER — Ambulatory Visit: Payer: Medicare HMO | Attending: Internal Medicine

## 2019-10-28 DIAGNOSIS — Z23 Encounter for immunization: Secondary | ICD-10-CM | POA: Insufficient documentation

## 2019-10-28 NOTE — Progress Notes (Signed)
   Covid-19 Vaccination Clinic  Name:  [name] [name]    MRN: [id number] DOB: Mar 05, 1933  10/28/2019  [name] [name] was observed post Covid-19 immunization for 15 minutes without incidence. She was provided with Vaccine Information Sheet and instruction to access the V-Safe system.   [name] [name] was instructed to call 911 with any severe reactions post vaccine: Marland Kitchen Difficulty breathing  . Swelling of your face and throat  . A fast heartbeat  . A bad rash all over your body  . Dizziness and weakness    Immunizations Administered    Name Date Dose VIS Date Route   Pfizer COVID-19 Vaccine 10/28/2019  4:03 PM 0.3 mL 08/25/2019 Intramuscular   Manufacturer: [place]   Lot: [id number]   [place]: [id number]

## 2019-11-21 ENCOUNTER — Ambulatory Visit: Payer: Medicare HMO | Attending: Internal Medicine

## 2019-11-21 DIAGNOSIS — Z23 Encounter for immunization: Secondary | ICD-10-CM | POA: Insufficient documentation

## 2019-11-23 ENCOUNTER — Other Ambulatory Visit: Payer: Self-pay | Admitting: Family Medicine

## 2019-11-23 DIAGNOSIS — E1149 Type 2 diabetes mellitus with other diabetic neurological complication: Secondary | ICD-10-CM

## 2019-11-23 MED ORDER — OXYBUTYNIN CHLORIDE 5 MG PO TABS: ORAL_TABLET | ORAL | 4 refills | Status: DC

## 2019-11-23 NOTE — Telephone Encounter (Signed)
Requested medication (s) are due for refill today-yes  Requested medication (s) are on the active medication list -yes  Future visit scheduled -yes  Last refill: 06/02/19  Notes to clinic: Patient is requesting Rx that does not meet lab protocol- last visit instructed to continue all medications listed. Sent for PCP review of lab requirement.  Requested Prescriptions  Pending Prescriptions Disp Refills   quinapril (ACCUPRIL) 40 MG tablet [Pharmacy Med Name: QUINAPRIL HCL 40 MG Tablet] 180 tablet 4    Sig: TAKE 2 TABLETS EVERY DAY      Cardiovascular:  ACE Inhibitors Failed - 11/23/2019  2:54 PM      Failed - Cr in normal range and within 180 days    Creat  Date Value Ref Range Status  07/06/2017 0.90 (H) 0.60 - 0.88 mg/dL Final    Comment:    For patients >15 years of age, the reference limit for Creatinine is approximately 13% higher for people identified as African-American. .    Creatinine, Ser  Date Value Ref Range Status  04/10/2019 0.86 0.57 - 1.00 mg/dL Final   Creatinine, POC  Date Value Ref Range Status  02/26/2017 n/a mg/dL Final          Failed - K in normal range and within 180 days    Potassium  Date Value Ref Range Status  04/10/2019 4.0 3.5 - 5.2 mmol/L Final          Failed - Last BP in normal range    BP Readings from Last 1 Encounters:  10/16/19 140/76          Passed - Patient is not pregnant      Passed - Valid encounter within last 6 months    Recent Outpatient Visits           1 month ago Diabetes mellitus type 2 with neurological manifestations [facility])   [name], [name] [name]   7 months ago Diabetes mellitus type 2 with neurological manifestations [facility])   [name], [name] [name]   1 year ago Diabetes mellitus type 2 with neurological manifestations [facility])   [name] Sons, [name] [name]   1 year ago Diabetes mellitus type 2 with neurological manifestations [facility])    [facility] [name] Sons, [name] [name]   2 years ago Diabetes mellitus type 2 with neurological manifestations [facility])   [place], [name] [name]       Future Appointments             In 3 months Fisher, [name] Peri, [name] [name] [facility]             Signed Prescriptions Disp Refills   glipiZIDE (GLUCOTROL XL) 10 MG 24 hr tablet 90 tablet 1    Sig: TAKE 1 TABLET (10 MG TOTAL) BY MOUTH DAILY.      Endocrinology:  Diabetes - Sulfonylureas Passed - 11/23/2019  2:54 PM      Passed - HBA1C is between 0 and 7.9 and within 180 days    Hemoglobin A1C  Date Value Ref Range Status  10/16/2019 6.6 (A) 4.0 - 5.6 % Final   Hgb A1c MFr Bld  Date Value Ref Range Status  04/01/2015 6.7 (H) 4.8 - 5.6 % Final    Comment:             Pre-diabetes: 5.7 - 6.4          Diabetes: >6.4  Glycemic control for adults with diabetes: <7.0           Passed - Valid encounter within last 6 months    Recent Outpatient Visits           1 month ago Diabetes mellitus type 2 with neurological manifestations [facility])   [name] Sons, [name] [name]   7 months ago Diabetes mellitus type 2 with neurological manifestations [facility])   [name], [name] [name]   1 year ago Diabetes mellitus type 2 with neurological manifestations [facility])   [facility] [name] Sons, [name] [name]   1 year ago Diabetes mellitus type 2 with neurological manifestations [facility])   [facility] [name] Sons, [name] [name]   2 years ago Diabetes mellitus type 2 with neurological manifestations [facility])   [facility] [name] Sons, [name] [name]       Future Appointments             In 3 months Fisher, [name] Peri, [name] [name] [facility]              metFORMIN (GLUCOPHAGE-XR) 750 MG 24 hr tablet 180 tablet 1    Sig: TAKE 1 TABLET TWICE DAILY      Endocrinology:  Diabetes - Biguanides Passed - 11/23/2019  2:54 PM       Passed - Cr in normal range and within 360 days    Creat  Date Value Ref Range Status  07/06/2017 0.90 (H) 0.60 - 0.88 mg/dL Final    Comment:    For patients >27 years of age, the reference limit for Creatinine is approximately 13% higher for people identified as African-American. .    Creatinine, Ser  Date Value Ref Range Status  04/10/2019 0.86 0.57 - 1.00 mg/dL Final   Creatinine, POC  Date Value Ref Range Status  02/26/2017 n/a mg/dL Final          Passed - HBA1C is between 0 and 7.9 and within 180 days    Hemoglobin A1C  Date Value Ref Range Status  10/16/2019 6.6 (A) 4.0 - 5.6 % Final   Hgb A1c MFr Bld  Date Value Ref Range Status  04/01/2015 6.7 (H) 4.8 - 5.6 % Final    Comment:             Pre-diabetes: 5.7 - 6.4          Diabetes: >6.4          Glycemic control for adults with diabetes: <7.0           Passed - eGFR in normal range and within 360 days    GFR, Est African American  Date Value Ref Range Status  07/06/2017 69 > OR = 60 mL/min/1.38m Final   GFR calc Af Amer  Date Value Ref Range Status  04/10/2019 72 >59 mL/min/1.73 Final   GFR, Est Non African American  Date Value Ref Range Status  07/06/2017 60 > OR = 60 mL/min/1.718mFinal   GFR calc non Af Amer  Date Value Ref Range Status  04/10/2019 62 >59 mL/min/1.73 Final          Passed - Valid encounter within last 6 months    Recent Outpatient Visits           1 month ago Diabetes mellitus type 2 with neurological manifestations ([facility]  [name]   7 months ago Diabetes mellitus type 2 with neurological manifestations (  [facility])   [name], [name] [name]   1 year ago Diabetes mellitus type 2 with neurological manifestations [facility])   [facility] [name] Sons, [name] [name]   1 year ago Diabetes mellitus type 2 with neurological manifestations [facility])   [facility] [name] Sons, [name] [name]   2 years ago  Diabetes mellitus type 2 with neurological manifestations [facility])   [name] Sons, [name] [name]       Future Appointments             In 3 months Fisher, [name] Peri, [name] [name] [facility]              Alcohol Swabs (B-D SINGLE USE SWABS REGULAR) PADS 100 each 4    Sig: USE TEST BLOOD SUGAR EVERY DAY      Off-Protocol Failed - 11/23/2019  2:54 PM      Failed - Medication not assigned to a protocol, review manually.      Passed - Valid encounter within last 12 months    Recent Outpatient Visits           1 month ago Diabetes mellitus type 2 with neurological manifestations [facility])   [name] Sons, [name] [name]   7 months ago Diabetes mellitus type 2 with neurological manifestations [facility])   [facility] [name] Sons, [name] [name]   1 year ago Diabetes mellitus type 2 with neurological manifestations [facility])   [name] Sons, [name] [name]   1 year ago Diabetes mellitus type 2 with neurological manifestations [facility])   [name] Sons, [name] [name]   2 years ago Diabetes mellitus type 2 with neurological manifestations [facility])   [place], [name] [name]       Future Appointments             In 3 months Fisher, [name] Peri, [name] [name] [facility]              furosemide (LASIX) 20 MG tablet 90 tablet 1    Sig: TAKE 1 TABLET EVERY DAY      Cardiovascular:  Diuretics - Loop Failed - 11/23/2019  2:54 PM      Failed - Last BP in normal range    BP Readings from Last 1 Encounters:  10/16/19 140/76          Passed - K in normal range and within 360 days    Potassium  Date Value Ref Range Status  04/10/2019 4.0 3.5 - 5.2 mmol/L Final          Passed - Ca in normal range and within 360 days    Calcium  Date Value Ref Range Status  04/10/2019 10.2 8.7 - 10.3 mg/dL Final          Passed - Na in normal range and within 360 days    Sodium  Date  Value Ref Range Status  04/10/2019 143 134 - 144 mmol/L Final          Passed - Cr in normal range and within 360 days    Creat  Date Value Ref Range Status  07/06/2017 0.90 (H) 0.60 - 0.88 mg/dL Final    Comment:    For patients >33 years of age, the reference limit for Creatinine is approximately 13% higher for people identified as African-American. .    Creatinine, Ser  Date Value Ref Range Status  04/10/2019 0.86 0.57 -  1.00 mg/dL Final   Creatinine, POC  Date Value Ref Range Status  02/26/2017 n/a mg/dL Final          Passed - Valid encounter within last 6 months    Recent Outpatient Visits           1 month ago Diabetes mellitus type 2 with neurological manifestations [facility])   [facility] [name] Sons, [name] [name]   7 months ago Diabetes mellitus type 2 with neurological manifestations [facility])   [facility] [name] Sons, [name] [name]   1 year ago Diabetes mellitus type 2 with neurological manifestations [facility])   [name], [name] [name]   1 year ago Diabetes mellitus type 2 with neurological manifestations [facility])   [name], [name] [name]   2 years ago Diabetes mellitus type 2 with neurological manifestations [facility])   [name] Sons, [name] [name]       Future Appointments             In 3 months Fisher, [name] Peri, [name] [name] [facility]                Requested Prescriptions  Pending Prescriptions Disp Refills   quinapril (ACCUPRIL) 40 MG tablet [Pharmacy Med Name: QUINAPRIL HCL 40 MG Tablet] 180 tablet 4    Sig: TAKE 2 TABLETS EVERY DAY      Cardiovascular:  ACE Inhibitors Failed - 11/23/2019  2:54 PM      Failed - Cr in normal range and within 180 days    Creat  Date Value Ref Range Status  07/06/2017 0.90 (H) 0.60 - 0.88 mg/dL Final    Comment:    For patients >81 years of age, the reference limit for Creatinine is approximately 13% higher for  people identified as African-American. .    Creatinine, Ser  Date Value Ref Range Status  04/10/2019 0.86 0.57 - 1.00 mg/dL Final   Creatinine, POC  Date Value Ref Range Status  02/26/2017 n/a mg/dL Final          Failed - K in normal range and within 180 days    Potassium  Date Value Ref Range Status  04/10/2019 4.0 3.5 - 5.2 mmol/L Final          Failed - Last BP in normal range    BP Readings from Last 1 Encounters:  10/16/19 140/76          Passed - Patient is not pregnant      Passed - Valid encounter within last 6 months    Recent Outpatient Visits           1 month ago Diabetes mellitus type 2 with neurological manifestations [facility])   [name], [name] [name]   7 months ago Diabetes mellitus type 2 with neurological manifestations [facility])   [facility] [name] Sons, [name] [name]   1 year ago Diabetes mellitus type 2 with neurological manifestations [facility])   [facility] [name] Sons, [name] [name]   1 year ago Diabetes mellitus type 2 with neurological manifestations [facility])   [facility] [name] Sons, [name] [name]   2 years ago Diabetes mellitus type 2 with neurological manifestations [facility])   [name] [name], [name] [name], [name] [name]       Future Appointments             In 3 months Fisher, [name] Peri, [name] [name] [facility] [place]  Signed Prescriptions Disp Refills   glipiZIDE (GLUCOTROL XL) 10 MG 24 hr tablet 90 tablet 1    Sig: TAKE 1 TABLET (10 MG TOTAL) BY MOUTH DAILY.      Endocrinology:  Diabetes - Sulfonylureas Passed - 11/23/2019  2:54 PM      Passed - HBA1C is between 0 and 7.9 and within 180 days    Hemoglobin A1C  Date Value Ref Range Status  10/16/2019 6.6 (A) 4.0 - 5.6 % Final   Hgb A1c MFr Bld  Date Value Ref Range Status  04/01/2015 6.7 (H) 4.8 - 5.6 % Final    Comment:             Pre-diabetes: 5.7 - 6.4          Diabetes: >6.4          Glycemic control for  adults with diabetes: <7.0           Passed - Valid encounter within last 6 months    Recent Outpatient Visits           1 month ago Diabetes mellitus type 2 with neurological manifestations [facility])   [facility] [name] Sons, [name] [name]   7 months ago Diabetes mellitus type 2 with neurological manifestations [facility])   [facility] [name] Sons, [name] [name]   1 year ago Diabetes mellitus type 2 with neurological manifestations [facility])   [facility] [name] [facility], [name] [name]   1 year ago Diabetes mellitus type 2 with neurological manifestations [facility])   [facility] [name] Sons, [name] [name]   2 years ago Diabetes mellitus type 2 with neurological manifestations [facility])   [facility] [name] Sons, [name] [name]       Future Appointments             In 3 months Fisher, [name] Peri, [name] [name] [facility]              metFORMIN (GLUCOPHAGE-XR) 750 MG 24 hr tablet 180 tablet 1    Sig: TAKE 1 TABLET TWICE DAILY      Endocrinology:  Diabetes - Biguanides Passed - 11/23/2019  2:54 PM      Passed - Cr in normal range and within 360 days    Creat  Date Value Ref Range Status  07/06/2017 0.90 (H) 0.60 - 0.88 mg/dL Final    Comment:    For patients >57 years of age, the reference limit for Creatinine is approximately 13% higher for people identified as African-American. .    Creatinine, Ser  Date Value Ref Range Status  04/10/2019 0.86 0.57 - 1.00 mg/dL Final   Creatinine, POC  Date Value Ref Range Status  02/26/2017 n/a mg/dL Final          Passed - HBA1C is between 0 and 7.9 and within 180 days    Hemoglobin A1C  Date Value Ref Range Status  10/16/2019 6.6 (A) 4.0 - 5.6 % Final   Hgb A1c MFr Bld  Date Value Ref Range Status  04/01/2015 6.7 (H) 4.8 - 5.6 % Final    Comment:             Pre-diabetes: 5.7 - 6.4          Diabetes: >6.4          Glycemic control for adults with diabetes: <7.0           Passed -  eGFR in normal range and within 360 days  GFR, Est African American  Date Value Ref Range Status  07/06/2017 69 > OR = 60 mL/min/1.26m Final   GFR calc Af Amer  Date Value Ref Range Status  04/10/2019 72 >59 mL/min/1.73 Final   GFR, Est Non African American  Date Value Ref Range Status  07/06/2017 60 > OR = 60 mL/min/1.739mFinal   GFR calc non Af Amer  Date Value Ref Range Status  04/10/2019 62 >59 mL/min/1.73 Final          Passed - Valid encounter within last 6 months    Recent Outpatient Visits           1 month ago Diabetes mellitus type 2 with neurological manifestations ([facility]  [name]   7 months ago Diabetes mellitus type 2 with neurological manifestations ([facility]  [name]   1 year ago Diabetes mellitus type 2 with neurological manifestations ([facility]  [name]   1 year ago Diabetes mellitus type 2 with neurological manifestations ([facility]  [name]   2 years ago Diabetes mellitus type 2 with neurological manifestations ([facility]  BuGolden ValleyDoKirstie PeriMD       Future Appointments             In 3 months Fisher, DoKirstie PeriMD [facility]              Alcohol Swabs (B-D SINGLE USE SWABS REGULAR) PADS 100 each 4    Sig: USE TEST BLOOD SUGAR EVERY DAY      Off-Protocol Failed - 11/23/2019  2:54 PM      Failed - Medication not assigned to a protocol, review manually.      Passed - Valid encounter within last 12 months    Recent Outpatient Visits           1 month ago Diabetes mellitus type 2 with neurological manifestations ([facility]  [name]   7 months ago Diabetes mellitus type 2 with neurological manifestations ([facility]  [facility]ie [name]   1 year ago Diabetes mellitus type 2 with neurological  manifestations ([facility]  [name]   1 year ago Diabetes mellitus type 2 with neurological manifestations ([facility]  BuVol[facility]ia Endoscopy [facility] Surgery CenteriB[facility]ie [name]   2 years ago Diabetes mellitus type 2 with neurological manifestations (HS[facility]  BuAshmoreMD       Future Appointments             In 3 months Fisher, DoKirstie PeriMD Bu[facility]              furosemide (LASIX) 20 MG tablet 90 tablet 1    Sig: TAKE 1 TABLET EVERY DAY      Cardiovascular:  Diuretics - Loop Failed - 11/23/2019  2:54 PM      Failed - Last BP in normal range    BP Readings from Last 1 Encounters:  10/16/19 140/76          Passed - K in normal range and within 360 days    Potassium  Date Value Ref Range Status  04/10/2019 4.0 3.5 - 5.2 mmol/L Final          Passed - Ca in  normal range and within 360 days    Calcium  Date Value Ref Range Status  04/10/2019 10.2 8.7 - 10.3 mg/dL Final          Passed - Na in normal range and within 360 days    Sodium  Date Value Ref Range Status  04/10/2019 143 134 - 144 mmol/L Final          Passed - Cr in normal range and within 360 days    Creat  Date Value Ref Range Status  07/06/2017 0.90 (H) 0.60 - 0.88 mg/dL Final    Comment:    For patients >22 years of age, the reference limit for Creatinine is approximately 13% higher for people identified as African-American. .    Creatinine, Ser  Date Value Ref Range Status  04/10/2019 0.86 0.57 - 1.00 mg/dL Final   Creatinine, POC  Date Value Ref Range Status  02/26/2017 n/a mg/dL Final          Passed - Valid encounter within last 6 months    Recent Outpatient Visits           1 month ago Diabetes mellitus type 2 with neurological manifestations [facility])   [facility] [name] Sons, [name] [name]   7 months ago Diabetes mellitus type 2 with neurological manifestations [facility])   [facility] [name] Sons, [name] [name]   1 year ago Diabetes mellitus type 2 with neurological manifestations [facility])   [facility] [name] Sons, [name] [name]   1 year ago Diabetes mellitus type 2 with neurological manifestations [facility])   [facility] [name] Sons, [name] [name]   2 years ago Diabetes mellitus type 2 with neurological manifestations [facility])   [facility] [name] [facility], [name] [name]       Future Appointments             In 3 months Fisher, [name] Peri, [name] [name] [facility] [place]

## 2019-11-23 NOTE — Telephone Encounter (Signed)
[facility] faxed refill request for the following medications:  oxybutynin (DITROPAN) 5 MG tablet   Please advise.  Thanks, [employer]

## 2019-12-26 DIAGNOSIS — H2513 Age-related nuclear cataract, bilateral: Secondary | ICD-10-CM | POA: Diagnosis not present

## 2019-12-26 DIAGNOSIS — H25013 Cortical age-related cataract, bilateral: Secondary | ICD-10-CM | POA: Diagnosis not present

## 2019-12-26 DIAGNOSIS — H40013 Open angle with borderline findings, low risk, bilateral: Secondary | ICD-10-CM | POA: Diagnosis not present

## 2019-12-26 DIAGNOSIS — R7309 Other abnormal glucose: Secondary | ICD-10-CM | POA: Diagnosis not present

## 2019-12-26 LAB — HM DIABETES EYE EXAM

## 2020-01-16 ENCOUNTER — Encounter: Payer: Self-pay | Admitting: Family Medicine

## 2020-02-21 ENCOUNTER — Telehealth: Payer: Self-pay | Admitting: Family Medicine

## 2020-02-21 DIAGNOSIS — E785 Hyperlipidemia, unspecified: Secondary | ICD-10-CM

## 2020-02-21 DIAGNOSIS — E1149 Type 2 diabetes mellitus with other diabetic neurological complication: Secondary | ICD-10-CM

## 2020-02-21 DIAGNOSIS — E538 Deficiency of other specified B group vitamins: Secondary | ICD-10-CM

## 2020-02-21 DIAGNOSIS — E039 Hypothyroidism, unspecified: Secondary | ICD-10-CM

## 2020-02-21 NOTE — Telephone Encounter (Signed)
Tried calling patient. Left message to call back. OK for [facility] to advise of message below.

## 2020-02-21 NOTE — Telephone Encounter (Signed)
Patient's daughter [name] called, left VM to return the call to the office for lab results.

## 2020-02-21 NOTE — Telephone Encounter (Signed)
Received MyChart message which I think was sent by patients daughter asking about labs for her physical later this month. Have place future order if she would like to bring her in for labs a few days before her appt.

## 2020-02-21 NOTE — Telephone Encounter (Signed)
See telephone message 02-21-2020

## 2020-02-22 NOTE — Telephone Encounter (Signed)
Attempted to call pt's daughter, [name].  Left vm to call office re: recommendation for labs from Dr. Caryn Section.

## 2020-02-22 NOTE — Telephone Encounter (Signed)
Daughter, [name], called back. Given Dr.Fisher's message in regard to lab work. Verbalizes understanding.

## 2020-02-23 ENCOUNTER — Other Ambulatory Visit: Payer: Self-pay | Admitting: Family Medicine

## 2020-03-07 ENCOUNTER — Other Ambulatory Visit: Payer: Self-pay

## 2020-03-07 DIAGNOSIS — E538 Deficiency of other specified B group vitamins: Secondary | ICD-10-CM

## 2020-03-07 DIAGNOSIS — E039 Hypothyroidism, unspecified: Secondary | ICD-10-CM

## 2020-03-07 DIAGNOSIS — E785 Hyperlipidemia, unspecified: Secondary | ICD-10-CM | POA: Diagnosis not present

## 2020-03-07 DIAGNOSIS — E1149 Type 2 diabetes mellitus with other diabetic neurological complication: Secondary | ICD-10-CM | POA: Diagnosis not present

## 2020-03-08 LAB — HEMOGLOBIN A1C
Est. average glucose Bld gHb Est-mCnc: 163 mg/dL
Hgb A1c MFr Bld: 7.3 % — ABNORMAL HIGH (ref 4.8–5.6)

## 2020-03-08 LAB — CBC
Hematocrit: 39.3 % (ref 34.0–46.6)
Hemoglobin: 13 g/dL (ref 11.1–15.9)
MCH: 27.7 pg (ref 26.6–33.0)
MCHC: 33.1 g/dL (ref 31.5–35.7)
MCV: 84 fL (ref 79–97)
Platelets: 329 10*3/uL (ref 150–450)
RBC: 4.7 x10E6/uL (ref 3.77–5.28)
RDW: 14 % (ref 11.7–15.4)
WBC: 6.8 10*3/uL (ref 3.4–10.8)

## 2020-03-08 LAB — TSH: TSH: 1.23 u[IU]/mL (ref 0.450–4.500)

## 2020-03-08 LAB — LIPID PANEL
Chol/HDL Ratio: 5.4 ratio — ABNORMAL HIGH (ref 0.0–4.4)
Cholesterol, Total: 206 mg/dL — ABNORMAL HIGH (ref 100–199)
HDL: 38 mg/dL — ABNORMAL LOW (ref >39)
LDL Chol Calc (NIH): 148 mg/dL — ABNORMAL HIGH (ref 0–99)
Triglycerides: 112 mg/dL (ref 0–149)
VLDL Cholesterol Cal: 20 mg/dL (ref 5–40)

## 2020-03-08 LAB — COMPREHENSIVE METABOLIC PANEL
ALT: 20 IU/L (ref 0–32)
AST: 27 IU/L (ref 0–40)
Albumin/Globulin Ratio: 1.1 — ABNORMAL LOW (ref 1.2–2.2)
Albumin: 4.1 g/dL (ref 3.6–4.6)
Alkaline Phosphatase: 64 IU/L (ref 48–121)
BUN/Creatinine Ratio: 16 (ref 12–28)
BUN: 14 mg/dL (ref 8–27)
Bilirubin Total: 0.6 mg/dL (ref 0.0–1.2)
CO2: 23 mmol/L (ref 20–29)
Calcium: 10 mg/dL (ref 8.7–10.3)
Chloride: 101 mmol/L (ref 96–106)
Creatinine, Ser: 0.87 mg/dL (ref 0.57–1.00)
GFR calc Af Amer: 70 mL/min/{1.73_m2} (ref >59)
GFR calc non Af Amer: 61 mL/min/{1.73_m2} (ref >59)
Globulin, Total: 3.7 g/dL (ref 1.5–4.5)
Glucose: 136 mg/dL — ABNORMAL HIGH (ref 65–99)
Potassium: 4 mmol/L (ref 3.5–5.2)
Sodium: 142 mmol/L (ref 134–144)
Total Protein: 7.8 g/dL (ref 6.0–8.5)

## 2020-03-08 LAB — VITAMIN B12: Vitamin B-12: 333 pg/mL (ref 232–1245)

## 2020-03-12 NOTE — Progress Notes (Signed)
I,Roshena L Chambers,acting as a scribe for Lelon Huh, MD.,have documented all relevant documentation on the behalf of Lelon Huh, MD,as directed by  Lelon Huh, MD while in the presence of Lelon Huh, MD.   Established patient visit   Patient: [name] [name]   DOB: June 22, 1933   86 y.o. Female  MRN: [id number] Visit Date: 03/13/2020  Today's healthcare provider: Lelon Huh, MD   Chief Complaint  Patient presents with  . Diabetes  . Hypertension  . Hypothyroidism   Subjective    HPI Diabetes Mellitus Type II, Follow-up  Lab Results  Component Value Date   HGBA1C 7.3 (H) 03/07/2020   HGBA1C 6.6 (A) 10/16/2019   HGBA1C 7.2 (A) 04/10/2019   Wt Readings from Last 3 Encounters:  03/13/20 188 lb (85.3 kg)  10/16/19 178 lb 3.2 oz (80.8 kg)  04/10/19 176 lb (79.8 kg)   Last seen for diabetes 4 months ago.  Management since then includes recommend starting alpha-lipoic acid to regiment for neuropathy. She reports good compliance with treatment. She is not having side effects.  Symptoms: No fatigue No foot ulcerations  No appetite changes No nausea  Yes paresthesia of the feet  No polydipsia  No polyuria Yes visual disturbances (due to cataracts)  No vomiting     Home blood sugar records: blood sugars are not checked  Episodes of hypoglycemia? No    Current insulin regiment: none Most Recent Eye Exam: 12/26/2019 Current exercise: none Current diet habits: well balanced   ---------------------------------------------------------------------------------------------------  Hypertension, follow-up  BP Readings from Last 3 Encounters:  10/16/19 140/76  04/10/19 (!) 143/76  10/10/18 140/70   Wt Readings from Last 3 Encounters:  03/13/20 188 lb (85.3 kg)  10/16/19 178 lb 3.2 oz (80.8 kg)  04/10/19 176 lb (79.8 kg)     She was last seen for hypertension 3 months ago.  BP at that visit was 140/76. Management since that visit includes continuing  sam medications.  She reports good compliance with treatment. She is not having side effects.  She is following a Regular diet. She is not exercising. She does not smoke.  Use of agents associated with hypertension: NSAIDS.   Outside blood pressures are not checked. Symptoms: No chest pain No chest pressure  No palpitations No syncope  No dyspnea No orthopnea  No paroxysmal nocturnal dyspnea Yes lower extremity edema   Pertinent labs: Lab Results  Component Value Date   CHOL 206 (H) 03/07/2020   HDL 38 (L) 03/07/2020   LDLCALC 148 (H) 03/07/2020   TRIG 112 03/07/2020   CHOLHDL 5.4 (H) 03/07/2020   Lab Results  Component Value Date   NA 142 03/07/2020   K 4.0 03/07/2020   CREATININE 0.87 03/07/2020   GFRNONAA 61 03/07/2020   GFRAA 70 03/07/2020   GLUCOSE 136 (H) 03/07/2020     The ASCVD Risk score (Goff DC Jr., et al., 2013) failed to calculate for the following reasons:   The 2013 ASCVD risk score is only valid for ages 74 to 52   ---------------------------------------------------------------------------------------------------  Follow up for Vitamin B12 deficiency:  The patient was last seen for this 4 months ago. Changes made at last visit include none; continue OTC b12 supplement.  She reports poor compliance with treatment. Patient has not been taking vitamin b12 supplements. She feels that condition is Unchanged. She is not having side effects.   -----------------------------------------------------------------------------------------  Hypothyroid, follow-up  Lab Results  Component Value Date   TSH 1.230 03/07/2020  TSH 0.828 04/10/2019   TSH 0.256 (L) 10/10/2018   Wt Readings from Last 3 Encounters:  03/13/20 188 lb (85.3 kg)  10/16/19 178 lb 3.2 oz (80.8 kg)  04/10/19 176 lb (79.8 kg)    She was last seen for hypothyroid 10 months ago.  Management since that visit includes continue same medication. She reports good compliance with  treatment. She is not having side effects.   Symptoms: No change in energy level Yes constipation  No diarrhea No heat / cold intolerance  No nervousness No palpitations  No weight changes    -----------------------------------------------------------------------------------------  Fall: Patient's daughter states that patient has a fall 2 weeks ago. Fall occurred when patient was bending over trying to pick something up from the floor. Patient was found on the floor. She denies any loss of consciousness. Patient's daughter Stephens November has been staying with patient since the fall and needs FMLA paper work filled out. Patient is unable to safely ambulate in her home and unable to engage in activities of daily living without the assistance of her daughter.       Medications: Outpatient Medications Prior to Visit  Medication Sig  . Accu-Chek Softclix Lancets lancets USE TO CHECK BLOOD SUGAR ONCE A DAY  . Alcohol Swabs (B-D SINGLE USE SWABS REGULAR) PADS USE TEST BLOOD SUGAR EVERY DAY  . amLODipine (NORVASC) 5 MG tablet TAKE 1 TABLET EVERY DAY  . aspirin 81 MG tablet Take 1 tablet by mouth daily.  . Blood Glucose Monitoring Suppl (ACCU-CHEK AVIVA PLUS) w/Device KIT USE TO CHECK BLOOD SUGAR ONCE A DAY  . Blood Pressure Monitoring (BLOOD PRESSURE MONITOR/ARM) DEVI Use to check blood pressure of upper arm daily as needed for hypertension (I10)  . ezetimibe (ZETIA) 10 MG tablet Take 1 tablet (10 mg total) by mouth daily.  . fluticasone (FLONASE) 50 MCG/ACT nasal spray PLACE 2 SPRAYS INTO BOTH NOSTRILS DAILY.  . furosemide (LASIX) 20 MG tablet TAKE 1 TABLET EVERY DAY  . gabapentin (NEURONTIN) 100 MG capsule Take 1 capsule (100 mg total) by mouth every morning.  . gabapentin (NEURONTIN) 300 MG capsule Take 1 capsule (300 mg total) by mouth at bedtime. In addition to the 139m capsule in the morning  . glipiZIDE (GLUCOTROL XL) 10 MG 24 hr tablet TAKE 1 TABLET (10 MG TOTAL) BY MOUTH DAILY.  .Marland Kitchen glucose blood (ACCU-CHEK AVIVA PLUS) test strip TEST BLOOD SUGAR ONE TIME DAILY  . levothyroxine (SYNTHROID) 75 MCG tablet Take 1 tablet (75 mcg total) by mouth daily.  . metFORMIN (GLUCOPHAGE-XR) 750 MG 24 hr tablet TAKE 1 TABLET TWICE DAILY  . Omega-3 Fatty Acids (FISH OIL) 1000 MG CAPS Take 1 capsule by mouth daily.  .Marland Kitchenoxybutynin (DITROPAN) 5 MG tablet TAKE 1 TABLET  2  TO  3  TIMES  DAILY AS NEEDED  FOR  URINARY FREQUENCY  . quinapril (ACCUPRIL) 40 MG tablet TAKE 2 TABLETS EVERY DAY  . [DISCONTINUED] cholecalciferol (VITAMIN D) 1000 UNITS tablet Take 1 tablet by mouth daily. (Patient not taking: Reported on 03/13/2020)  . [DISCONTINUED] vitamin B-12 (CYANOCOBALAMIN) 100 MCG tablet Take 1,000 mcg by mouth daily.  (Patient not taking: Reported on 03/13/2020)   No facility-administered medications prior to visit.    Review of Systems  Constitutional: Negative for appetite change, chills, fatigue and fever.  Respiratory: Negative for chest tightness and shortness of breath.   Cardiovascular: Negative for chest pain and palpitations.  Gastrointestinal: Positive for constipation. Negative for abdominal pain, nausea and vomiting.  Neurological: Positive for numbness (in feet). Negative for dizziness and weakness.  Psychiatric/Behavioral:       Problems with memory      Objective    Temp 97.7 F (36.5 C) (Temporal)   Resp 16   Wt 188 lb (85.3 kg)   BMI 32.27 kg/m    Physical Exam   General: Appearance:    Obese female in no acute distress  Eyes:    PERRL, conjunctiva/corneas clear, EOM's intact       Lungs:     Clear to auscultation bilaterally, respirations unlabored  Heart:    Normal heart rate. Normal rhythm. No murmurs, rubs, or gallops.   MS:   All extremities are intact.   Neurologic:   Awake, alert, oriented x 3. Very slow to stand and requires assistance or use of Rollator to ambulate.         Assessment & Plan     1. Weakness of both lower extremities Strongly  encourage referral to physical therapy for strength training and fall risk reduction.  She adamantly refused referral, but was advised she or her daughter could call back and request referral at her convenience. Patient requires assistance with ADLs which is currently only available from her daughter [name]. FMLA forms completed for continuous leaver for Caregiver for 03-04-20 through 06-04-20  2. Spinal stenosis of lumbar region, unspecified whether neurogenic claudication present   3. Memory deficits start- donepezil (ARICEPT) 5 MG tablet; Take 1 tablet (5 mg total) by mouth at bedtime.  Dispense: 90 tablet; Refill: 1  4. Constipation, unspecified constipation type She takes occasionally OTC Dulcolax. Recommend trial of Miralax per PI  5. Essential hypertension Fairly well controlled, Continue current medications.    6. Diabetes mellitus type 2 with neurological manifestations ([place]) Well controlled.  Continue current medications.    7. Hypothyroidism, unspecified type Well controlled.   Addressed extensive list of chronic and acute medical problems today requiring 45 minutes reviewing her medical record, counseling patient regarding her conditions and coordination of care.          The entirety of the information documented in the History of Present Illness, Review of Systems and Physical Exam were personally obtained by me. Portions of this information were initially documented by the CMA and reviewed by me for thoroughness and accuracy.      Lelon Huh, MD  [facility] [phone] (phone) [phone] (fax)  [place]

## 2020-03-13 ENCOUNTER — Ambulatory Visit (INDEPENDENT_AMBULATORY_CARE_PROVIDER_SITE_OTHER): Payer: Medicare HMO | Admitting: Family Medicine

## 2020-03-13 ENCOUNTER — Encounter: Payer: Self-pay | Admitting: Family Medicine

## 2020-03-13 ENCOUNTER — Other Ambulatory Visit: Payer: Self-pay

## 2020-03-13 VITALS — BP 148/62 | HR 75 | Temp 97.7°F | Resp 16 | Wt 188.0 lb

## 2020-03-13 DIAGNOSIS — R413 Other amnesia: Secondary | ICD-10-CM | POA: Diagnosis not present

## 2020-03-13 DIAGNOSIS — E039 Hypothyroidism, unspecified: Secondary | ICD-10-CM | POA: Diagnosis not present

## 2020-03-13 DIAGNOSIS — M48061 Spinal stenosis, lumbar region without neurogenic claudication: Secondary | ICD-10-CM | POA: Diagnosis not present

## 2020-03-13 DIAGNOSIS — K5900 Constipation, unspecified: Secondary | ICD-10-CM

## 2020-03-13 DIAGNOSIS — R29898 Other symptoms and signs involving the musculoskeletal system: Secondary | ICD-10-CM | POA: Diagnosis not present

## 2020-03-13 DIAGNOSIS — E1149 Type 2 diabetes mellitus with other diabetic neurological complication: Secondary | ICD-10-CM | POA: Diagnosis not present

## 2020-03-13 DIAGNOSIS — I10 Essential (primary) hypertension: Secondary | ICD-10-CM

## 2020-03-13 MED ORDER — DONEPEZIL HCL 5 MG PO TABS: 5.0000 mg | ORAL_TABLET | Freq: Every day | ORAL | 1 refills | Status: DC

## 2020-03-13 NOTE — Patient Instructions (Addendum)
•   Take Miralax once every evening until you have normal bowel, then 2-3 times a week as needed.    I recommend you get a medical alert device in order to call for help in case of emergencies. MedAlert devices are available from [facility]. Call [phone] for information on how to order this device.     Try taking OTC Allegra (fexofenadine) to help with allergies   I think physical therapy would help with your leg strength. You can all the office at anytime for an order for physical therapy

## 2020-03-20 ENCOUNTER — Ambulatory Visit: Payer: Self-pay | Admitting: Family Medicine

## 2020-03-27 ENCOUNTER — Other Ambulatory Visit: Payer: Self-pay | Admitting: Family Medicine

## 2020-03-27 DIAGNOSIS — E1149 Type 2 diabetes mellitus with other diabetic neurological complication: Secondary | ICD-10-CM

## 2020-03-27 MED ORDER — GABAPENTIN 300 MG PO CAPS: 300.0000 mg | ORAL_CAPSULE | Freq: Every day | ORAL | 0 refills | Status: DC

## 2020-03-27 MED ORDER — GABAPENTIN 100 MG PO CAPS: 100.0000 mg | ORAL_CAPSULE | ORAL | 0 refills | Status: DC

## 2020-03-27 NOTE — Telephone Encounter (Signed)
Medication Refill - Medication: gabapentin (NEURONTIN) 100 MG capsule,gabapentin (NEURONTIN) 300 MG capsule    Has the patient contacted their pharmacy?Yes (Agent: If no, request that the patient contact the pharmacy for the refill.) (Agent: If yes, when and what did the pharmacy advise?)Contact PCP  Preferred Pharmacy (with phone number or street name):  [id number] [place] [address] Phone:  [phone]  Fax:  [phone]       Agent: Please be advised that RX refills may take up to 3 business days. We ask that you follow-up with your pharmacy.

## 2020-03-28 ENCOUNTER — Telehealth: Payer: Self-pay | Admitting: Family Medicine

## 2020-03-28 DIAGNOSIS — E1149 Type 2 diabetes mellitus with other diabetic neurological complication: Secondary | ICD-10-CM

## 2020-03-28 NOTE — Telephone Encounter (Signed)
[facility] faxed refill request for the following medications:  gabapentin (NEURONTIN) 100 MG capsule   Please advise.

## 2020-03-29 MED ORDER — GABAPENTIN 100 MG PO CAPS: 100.0000 mg | ORAL_CAPSULE | ORAL | 0 refills | Status: DC

## 2020-03-29 NOTE — Telephone Encounter (Signed)
[facility] to [facility]20 resent to mail order.

## 2020-04-01 DIAGNOSIS — H2512 Age-related nuclear cataract, left eye: Secondary | ICD-10-CM | POA: Diagnosis not present

## 2020-04-01 DIAGNOSIS — H2513 Age-related nuclear cataract, bilateral: Secondary | ICD-10-CM | POA: Diagnosis not present

## 2020-04-01 DIAGNOSIS — H25013 Cortical age-related cataract, bilateral: Secondary | ICD-10-CM | POA: Diagnosis not present

## 2020-04-01 DIAGNOSIS — H40013 Open angle with borderline findings, low risk, bilateral: Secondary | ICD-10-CM | POA: Diagnosis not present

## 2020-04-01 LAB — HM DIABETES EYE EXAM

## 2020-04-02 ENCOUNTER — Other Ambulatory Visit: Payer: Self-pay | Admitting: Family Medicine

## 2020-04-02 DIAGNOSIS — E1149 Type 2 diabetes mellitus with other diabetic neurological complication: Secondary | ICD-10-CM

## 2020-04-02 MED ORDER — EZETIMIBE 10 MG PO TABS: 10.0000 mg | ORAL_TABLET | Freq: Every day | ORAL | 3 refills | Status: DC

## 2020-04-02 NOTE — Telephone Encounter (Signed)
[place] faxed refill request for the following medications:  ezetimibe (ZETIA) 10 MG tablet  90 day supply  Last Rx: 09/26/2019 sent to local pharmacy LOV: 03/13/2020 Please advise. Thanks TNP

## 2020-04-08 ENCOUNTER — Other Ambulatory Visit: Payer: Self-pay | Admitting: Family Medicine

## 2020-04-08 DIAGNOSIS — E1149 Type 2 diabetes mellitus with other diabetic neurological complication: Secondary | ICD-10-CM

## 2020-04-08 MED ORDER — GABAPENTIN 300 MG PO CAPS: 300.0000 mg | ORAL_CAPSULE | Freq: Every day | ORAL | 4 refills | Status: DC

## 2020-04-08 NOTE — Telephone Encounter (Signed)
[facility] faxed refill request for the following medications:  gabapentin (NEURONTIN) 300 MG capsule   Please advise.  Thanks, [employer]

## 2020-04-10 DIAGNOSIS — H2512 Age-related nuclear cataract, left eye: Secondary | ICD-10-CM | POA: Diagnosis not present

## 2020-04-10 DIAGNOSIS — H2589 Other age-related cataract: Secondary | ICD-10-CM | POA: Diagnosis not present

## 2020-04-10 DIAGNOSIS — H25812 Combined forms of age-related cataract, left eye: Secondary | ICD-10-CM | POA: Diagnosis not present

## 2020-05-09 DIAGNOSIS — H2511 Age-related nuclear cataract, right eye: Secondary | ICD-10-CM | POA: Diagnosis not present

## 2020-05-09 DIAGNOSIS — H25011 Cortical age-related cataract, right eye: Secondary | ICD-10-CM | POA: Diagnosis not present

## 2020-05-27 ENCOUNTER — Telehealth: Payer: Self-pay

## 2020-05-27 MED ORDER — DULOXETINE HCL 30 MG PO CPEP: 30.0000 mg | ORAL_CAPSULE | Freq: Every day | ORAL | 1 refills | Status: DC

## 2020-05-27 NOTE — Telephone Encounter (Signed)
Patient's daughter [name] advised. Prescription sent into pharmacy.

## 2020-05-27 NOTE — Telephone Encounter (Signed)
Copied from [place] [id number]. Topic: General - Other >> May 27, 2020  1:42 PM Leward Quan A wrote: Reason for CRM: Patient daughter Stephens November called to request an Rx from Dr Caryn Section states that patient is having a lot of pain in her legs today have taken the gabapentin (NEURONTIN) 100 MG capsule but still complaining. Please call [name] at Ph# [phone]

## 2020-05-27 NOTE — Telephone Encounter (Signed)
Recommend she start duloxetine 30mg  one tablet every day, 30, rf x 1. Can continue the gabapentin for now, but if the duloxetine helps we may end up stopping it.

## 2020-06-10 ENCOUNTER — Telehealth: Payer: Self-pay

## 2020-06-10 NOTE — Telephone Encounter (Signed)
Copied from [place] [id number]. Topic: General - Other >> Jun 10, 2020  3:31 PM Hinda Lenis D wrote: Pts daughter need to speak with a nurse about some medical equipment for her mom / please advise

## 2020-06-12 NOTE — Telephone Encounter (Signed)
I called and spoke with patient's daughter [name]. [name] states her mom needs a new Rolator. Her current Rolator is old and falling apart. [name] is going to find a medical supply company that they want to use and call us back with their name and fax number. Patient has an upcoming follow up appointment with Dr. Caryn Section on 06/21/2020. In case patient's insurance requires documentation of medical necessity, [name] would like to address this need during that visit. Please advise if OK to write order for rolator, or whether office visit is needed before order can be written to document medical need.

## 2020-06-21 ENCOUNTER — Ambulatory Visit (INDEPENDENT_AMBULATORY_CARE_PROVIDER_SITE_OTHER): Payer: Medicare HMO | Admitting: Family Medicine

## 2020-06-21 ENCOUNTER — Encounter: Payer: Self-pay | Admitting: Family Medicine

## 2020-06-21 ENCOUNTER — Other Ambulatory Visit: Payer: Self-pay

## 2020-06-21 VITALS — BP 146/74 | HR 77 | Temp 98.5°F | Resp 16 | Ht 64.0 in | Wt 183.0 lb

## 2020-06-21 DIAGNOSIS — Z23 Encounter for immunization: Secondary | ICD-10-CM | POA: Diagnosis not present

## 2020-06-21 DIAGNOSIS — H35033 Hypertensive retinopathy, bilateral: Secondary | ICD-10-CM | POA: Diagnosis not present

## 2020-06-21 DIAGNOSIS — R29898 Other symptoms and signs involving the musculoskeletal system: Secondary | ICD-10-CM

## 2020-06-21 DIAGNOSIS — E1149 Type 2 diabetes mellitus with other diabetic neurological complication: Secondary | ICD-10-CM | POA: Diagnosis not present

## 2020-06-21 DIAGNOSIS — M5136 Other intervertebral disc degeneration, lumbar region: Secondary | ICD-10-CM

## 2020-06-21 LAB — POCT GLYCOSYLATED HEMOGLOBIN (HGB A1C)
Est. average glucose Bld gHb Est-mCnc: 151
Hemoglobin A1C: 6.9 % — AB (ref 4.0–5.6)

## 2020-06-21 NOTE — Patient Instructions (Addendum)
.   Please review the attached list of medications and notify my office if there are any errors.   . I recommend working with a physical therapist to help with leg strength and balance to prevent you from falling

## 2020-06-21 NOTE — Progress Notes (Signed)
Established patient visit   Patient: [name] [name]   DOB: 02/22/1933   86 y.o. Female  MRN: [id number] Visit Date: 06/21/2020  Today's healthcare provider: Lelon Huh, MD   Chief Complaint  Patient presents with  . Diabetes  . Hypertension  . Hypothyroidism   Subjective    HPI  Diabetes Mellitus Type II, Follow-up  Lab Results  Component Value Date   HGBA1C 6.9 (A) 06/21/2020   HGBA1C 7.3 (H) 03/07/2020   HGBA1C 6.6 (A) 10/16/2019   Wt Readings from Last 3 Encounters:  06/21/20 183 lb (83 kg)  03/13/20 188 lb (85.3 kg)  10/16/19 178 lb 3.2 oz (80.8 kg)   Last seen for diabetes 3 months ago.  Management since then includes continue same medications. She reports good compliance with treatment. She is not having side effects.  Symptoms: No fatigue No foot ulcerations  No appetite changes No nausea  No paresthesia of the feet  No polydipsia  No polyuria No visual disturbances   No vomiting     Home blood sugar records: blood sugars are not checked  Episodes of hypoglycemia? No    Current insulin regiment: none Most Recent Eye Exam: 04/01/2020 Current exercise: none Current diet habits: well balanced  Pertinent Labs: Lab Results  Component Value Date   CHOL 206 (H) 03/07/2020   HDL 38 (L) 03/07/2020   LDLCALC 148 (H) 03/07/2020   TRIG 112 03/07/2020   CHOLHDL 5.4 (H) 03/07/2020   Lab Results  Component Value Date   NA 142 03/07/2020   K 4.0 03/07/2020   CREATININE 0.87 03/07/2020   GFRNONAA 61 03/07/2020   GFRAA 70 03/07/2020   GLUCOSE 136 (H) 03/07/2020     ---------------------------------------------------------------------------------------------------  Hypertension, follow-up  BP Readings from Last 3 Encounters:  06/21/20 (!) 146/74  03/13/20 (!) 148/62  10/16/19 140/76   Wt Readings from Last 3 Encounters:  06/21/20 183 lb (83 kg)  03/13/20 188 lb (85.3 kg)  10/16/19 178 lb 3.2 oz (80.8 kg)     She was last seen for  hypertension 3 months ago.  BP at that visit was 148/62. Management since that visit includes continue same medications.  She reports good compliance with treatment. She is not having side effects.  She is following a Regular diet. She is not exercising. She does not smoke.  Use of agents associated with hypertension: NSAIDS and thyroid hormones.   Outside blood pressures are not chekced. Symptoms: No chest pain No chest pressure  No palpitations No syncope  No dyspnea No orthopnea  No paroxysmal nocturnal dyspnea No lower extremity edema   Pertinent labs: Lab Results  Component Value Date   CHOL 206 (H) 03/07/2020   HDL 38 (L) 03/07/2020   LDLCALC 148 (H) 03/07/2020   TRIG 112 03/07/2020   CHOLHDL 5.4 (H) 03/07/2020   Lab Results  Component Value Date   NA 142 03/07/2020   K 4.0 03/07/2020   CREATININE 0.87 03/07/2020   GFRNONAA 61 03/07/2020   GFRAA 70 03/07/2020   GLUCOSE 136 (H) 03/07/2020     The ASCVD Risk score (Goff DC Jr., et al., 2013) failed to calculate for the following reasons:   The 2013 ASCVD risk score is only valid for ages 4 to 72   ---------------------------------------------------------------------------------------------------  Hypothyroid, follow-up  Lab Results  Component Value Date   TSH 1.230 03/07/2020   TSH 0.828 04/10/2019   TSH 0.256 (L) 10/10/2018   Wt Readings from Last 3 Encounters:  06/21/20 183 lb (83 kg)  03/13/20 188 lb (85.3 kg)  10/16/19 178 lb 3.2 oz (80.8 kg)    She was last seen for hypothyroid 3 months ago.  Management since that visit includes continue same medication. She reports good compliance with treatment. She is not having side effects.   Symptoms: No change in energy level No constipation  No diarrhea No heat / cold intolerance  No nervousness No palpitations  No weight changes    -----------------------------------------------------------------------------------------  Follow up for weakness of  the lower extremities:  The patient was last seen for this 3 months ago.      During that visit physical therapy referral was strongly encouraged     for strength training and fall risk reduction.  She adamantly refused referral, but was advised she or her daughter could call back and request referral at her convenience. Since last visit patient duloxetine 51m one tablet every day was added due to leg pain. Patient was advised that she could continue the gabapentin for now.  She reports good compliance with treatment. She feels that condition is slightly improved. She is not having side effects.   She is having increasing difficulties ambulating by her self. She is not strong enough to stand or walk more than a few steps with a cane. She often has to be able to stop to sit to rest every 15-20 feet. She can only stand with assistance or by pushing herself up with her arms. She has an old walker that is no longer reliable and does not have a seat.  -----------------------------------------------------------------------------------------      Medications: Outpatient Medications Prior to Visit  Medication Sig  . Accu-Chek Softclix Lancets lancets USE TO CHECK BLOOD SUGAR ONCE A DAY  . Alcohol Swabs (B-D SINGLE USE SWABS REGULAR) PADS USE TEST BLOOD SUGAR EVERY DAY  . amLODipine (NORVASC) 5 MG tablet TAKE 1 TABLET EVERY DAY  . aspirin 81 MG tablet Take 1 tablet by mouth daily.  . Blood Glucose Monitoring Suppl (ACCU-CHEK AVIVA PLUS) w/Device KIT USE TO CHECK BLOOD SUGAR ONCE A DAY  . Blood Pressure Monitoring (BLOOD PRESSURE MONITOR/ARM) DEVI Use to check blood pressure of upper arm daily as needed for hypertension (I10)  . DULoxetine (CYMBALTA) 30 MG capsule Take 1 capsule (30 mg total) by mouth daily.  .Marland Kitchenezetimibe (ZETIA) 10 MG tablet Take 1 tablet (10 mg total) by mouth daily.  . fluticasone (FLONASE) 50 MCG/ACT nasal spray PLACE 2 SPRAYS INTO BOTH NOSTRILS DAILY.  . furosemide (LASIX) 20  MG tablet TAKE 1 TABLET EVERY DAY  . gabapentin (NEURONTIN) 100 MG capsule Take 1 capsule (100 mg total) by mouth every morning.  . gabapentin (NEURONTIN) 300 MG capsule Take 1 capsule (300 mg total) by mouth at bedtime. In addition to the 1043mcapsule in the morning  . glipiZIDE (GLUCOTROL XL) 10 MG 24 hr tablet TAKE 1 TABLET EVERY DAY  . glucose blood (ACCU-CHEK AVIVA PLUS) test strip TEST BLOOD SUGAR ONE TIME DAILY  . levothyroxine (SYNTHROID) 75 MCG tablet Take 1 tablet (75 mcg total) by mouth daily.  . metFORMIN (GLUCOPHAGE-XR) 750 MG 24 hr tablet TAKE 1 TABLET TWICE DAILY  . Omega-3 Fatty Acids (FISH OIL) 1000 MG CAPS Take 1 capsule by mouth daily.  . Marland Kitchenxybutynin (DITROPAN) 5 MG tablet TAKE 1 TABLET  2  TO  3  TIMES  DAILY AS NEEDED  FOR  URINARY FREQUENCY  . quinapril (ACCUPRIL) 40 MG tablet TAKE 2 TABLETS EVERY DAY  .  donepezil (ARICEPT) 5 MG tablet Take 1 tablet (5 mg total) by mouth at bedtime. (Patient not taking: Reported on 06/21/2020)   No facility-administered medications prior to visit.    Review of Systems  Constitutional: Negative for appetite change, chills, fatigue and fever.  Respiratory: Negative for chest tightness and shortness of breath.   Cardiovascular: Negative for chest pain and palpitations.  Gastrointestinal: Negative for abdominal pain, nausea and vomiting.  Musculoskeletal: Positive for myalgias.  Neurological: Negative for dizziness and weakness.      Objective    BP (!) 146/74 (BP Location: Right Arm, Patient Position: Sitting, Cuff Size: Large)   Pulse 77   Temp 98.5 F (36.9 C) (Oral)   Resp 16   Ht _0  (1.626 m)   Wt 183 lb (83 kg)   BMI 31.41 kg/m    Physical Exam   General: Appearance:    Obese female in no acute distress  Eyes:    PERRL, conjunctiva/corneas clear, EOM's intact       Lungs:     Clear to auscultation bilaterally, respirations unlabored  Heart:    Normal heart rate. Normal rhythm. No murmurs, rubs, or gallops.   MS:    All extremities are intact.   Neurologic:   Awake, alert, oriented x 3. No apparent focal neurological           defect. +4 MS both LEs        Results for orders placed or performed in visit on 06/21/20  POCT HgB A1C  Result Value Ref Range   Hemoglobin A1C 6.9 (A) 4.0 - 5.6 %   Est. average glucose Bld gHb Est-mCnc 151     Assessment & Plan     1. Diabetes mellitus type 2 with neurological manifestations ([place]) A1c is well controlled, Continue current medications.    2. Hypertensive retinopathy of both eyes SBP mildly elevated. She is to work on avoiding sodium in he diet. Continue current medications.    3. DDD (degenerative disc disease), lumbar Is exacerbating her difficulty walking.   4. Weakness of both lower extremities She requires mobility assistance device due to weakness in her legs requiring her to support herself with her upper body when standing or walking and needing frequent stops to rest and sit down. She is going to get a rolator to assist with with mobility. recommended that she try PT again since she has gotten worse in the years since she last had physical therapy.   5. Need for influenza vaccination  - Flu Vaccine QUAD High Dose(Fluad)   Future Appointments  Date Time Provider [place]  07/01/2020 11:00 AM [facility]  07/11/2020 10:30 AM [facility]  11/04/2020 11:00 AM Margy Sumler, Kirstie Peri, MD [facility]         The entirety of the information documented in the History of Present Illness, Review of Systems and Physical Exam were personally obtained by me. Portions of this information were initially documented by the CMA and reviewed by me for thoroughness and accuracy.      Lelon Huh, MD  [facility] [phone] (phone) [phone] (fax)  [place]

## 2020-06-28 ENCOUNTER — Telehealth: Payer: Self-pay | Admitting: Family Medicine

## 2020-06-28 ENCOUNTER — Other Ambulatory Visit: Payer: Self-pay | Admitting: Family Medicine

## 2020-06-28 DIAGNOSIS — E1149 Type 2 diabetes mellitus with other diabetic neurological complication: Secondary | ICD-10-CM

## 2020-06-28 NOTE — Telephone Encounter (Signed)
Copied from [place] [id number]. Topic: Quick Communication - Rx Refill/Question >> Jun 28, 2020 11:06 AM Erick Blinks wrote: Best contact: Stephens November [phone]   Pt's daughter called, received quinapril (ACCUPRIL) 40 MG tablet from [facility]. Has questions for clinic, please advise

## 2020-07-01 ENCOUNTER — Ambulatory Visit: Payer: Medicare HMO

## 2020-07-01 NOTE — Telephone Encounter (Signed)
Patient's daughter [name] [name] states she accidentally overlooked the RX and thought patient was not taking Quinapril.   [name] [name] also wanted to follow up from phone message on 06/10/2020. She requested a order for a Rolator and states she has not heard back yet.

## 2020-07-02 ENCOUNTER — Other Ambulatory Visit: Payer: Self-pay | Admitting: Family Medicine

## 2020-07-02 DIAGNOSIS — E1149 Type 2 diabetes mellitus with other diabetic neurological complication: Secondary | ICD-10-CM

## 2020-07-03 NOTE — Telephone Encounter (Signed)
Have written prescription for Rolator and medical necessity documented in 06-21-2020. Just need to know what [place] she needs it sent to, or if she want to get PT referral and get in through [facility].

## 2020-07-05 NOTE — Telephone Encounter (Signed)
I called and advised patients daughter as below. She states she wants to use [facility]. She gave me a phone number to call. I called the company and they are now called [employer]. I was given a fax number of [phone] and advised to fax over the order. I faxed the order along with office note and demographics.

## 2020-07-11 ENCOUNTER — Ambulatory Visit (INDEPENDENT_AMBULATORY_CARE_PROVIDER_SITE_OTHER): Payer: Medicare HMO

## 2020-07-11 ENCOUNTER — Other Ambulatory Visit: Payer: Self-pay

## 2020-07-11 DIAGNOSIS — Z23 Encounter for immunization: Secondary | ICD-10-CM

## 2020-07-17 DIAGNOSIS — R531 Weakness: Secondary | ICD-10-CM | POA: Diagnosis not present

## 2020-07-23 ENCOUNTER — Telehealth: Payer: Self-pay | Admitting: *Deleted

## 2020-07-23 NOTE — Chronic Care Management (AMB) (Signed)
  Chronic Care Management   Note  07/23/2020 Name: [name] [name] MRN: [id number] DOB: Nov 06, 1932  [name] Ciccone is a 86 y.o. year old female who is a primary care patient of Caryn Section, Kirstie Peri, MD. I reached out to [name] Hoyt by phone today in response to a referral sent by Ms. [name] Testerman's health plan.     Ms. Banka was given information about Chronic Care Management services today including:  1. CCM service includes personalized support from designated clinical staff supervised by her physician, including individualized plan of care and coordination with other care providers 2. 24/7 contact phone numbers for assistance for urgent and routine care needs. 3. Service will only be billed when office clinical staff spend 20 minutes or more in a month to coordinate care. 4. Only one practitioner may furnish and bill the service in a calendar month. 5. The patient may stop CCM services at any time (effective at the end of the month) by phone call to the office staff. 6. The patient will be responsible for cost sharing (co-pay) of up to 20% of the service fee (after annual deductible is met).  Patient agreed to services and verbal consent obtained.   Follow up plan: Telephone appointment with care management team member scheduled for: 08/06/2020  [facility]  Direct Dial: [phone]

## 2020-07-30 ENCOUNTER — Other Ambulatory Visit: Payer: Self-pay | Admitting: Family Medicine

## 2020-07-31 IMAGING — MG DIGITAL SCREENING BILATERAL MAMMOGRAM WITH TOMO AND CAD
6 of 10 series · 6 of 30 positions shown · non-contrast
Comparison: Previous exam(s).

CLINICAL DATA: Screening.

EXAM:
DIGITAL SCREENING BILATERAL MAMMOGRAM WITH TOMO AND CAD

[R CV synth-2D]
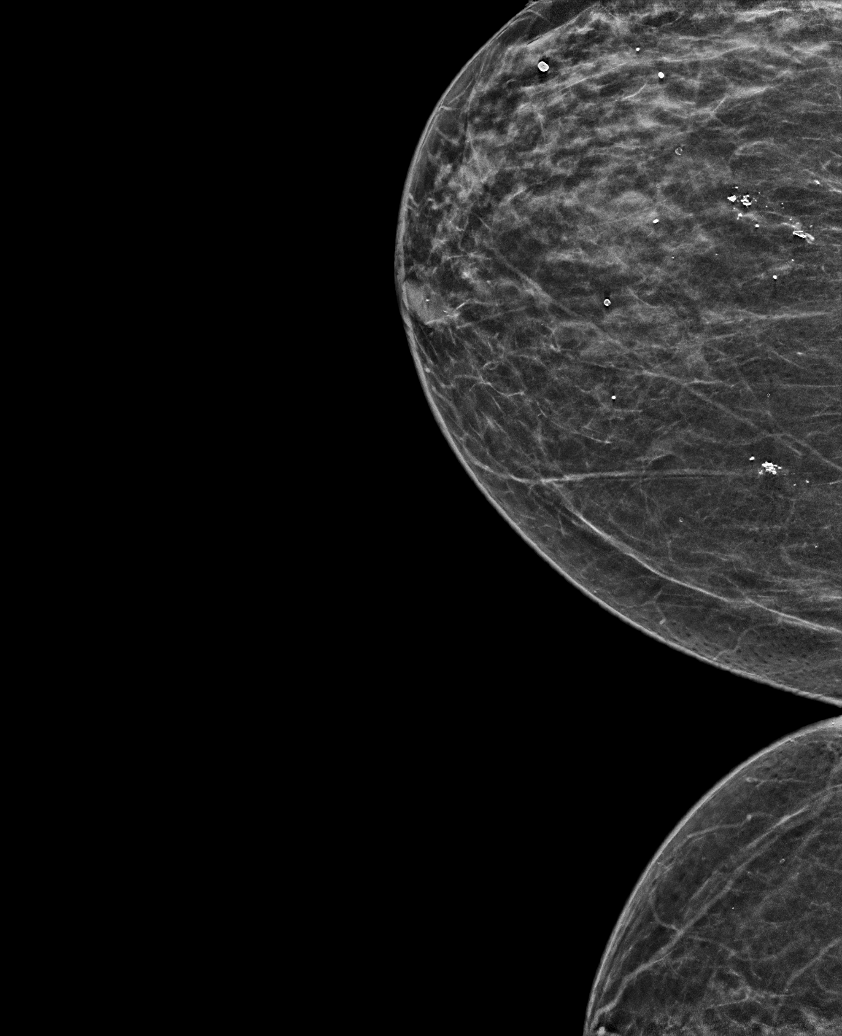

[L CC synth-2D]
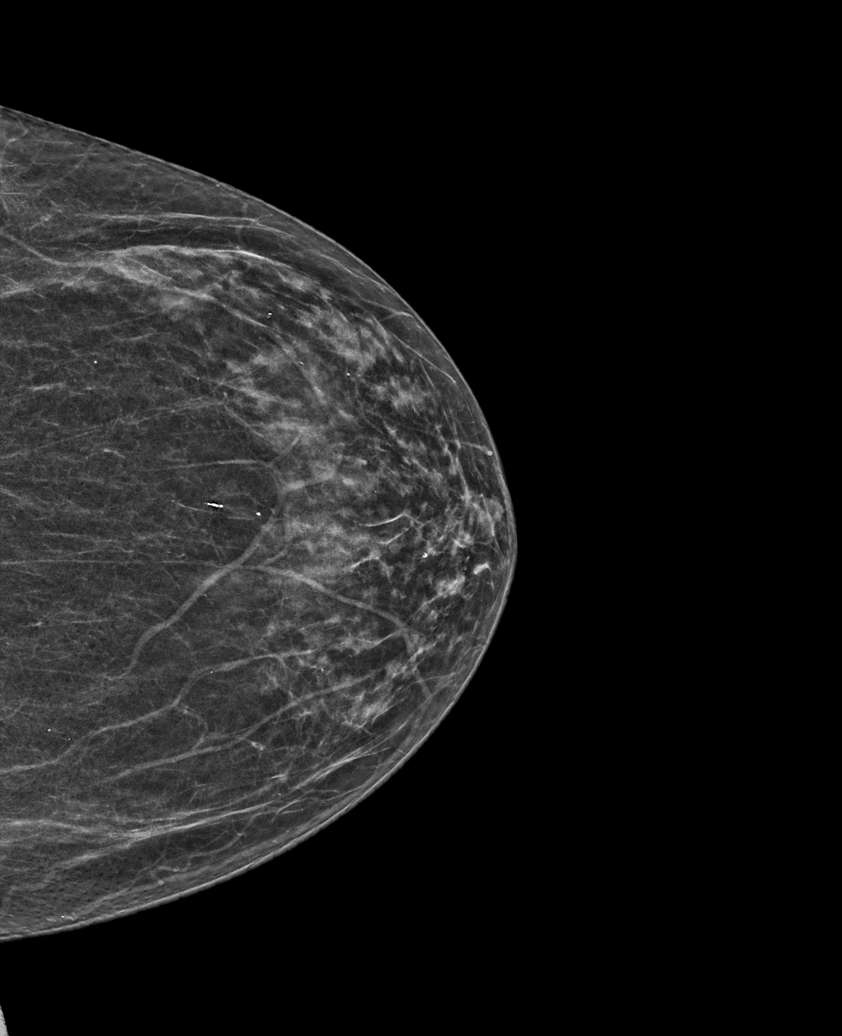

[R MLO synth-2D]
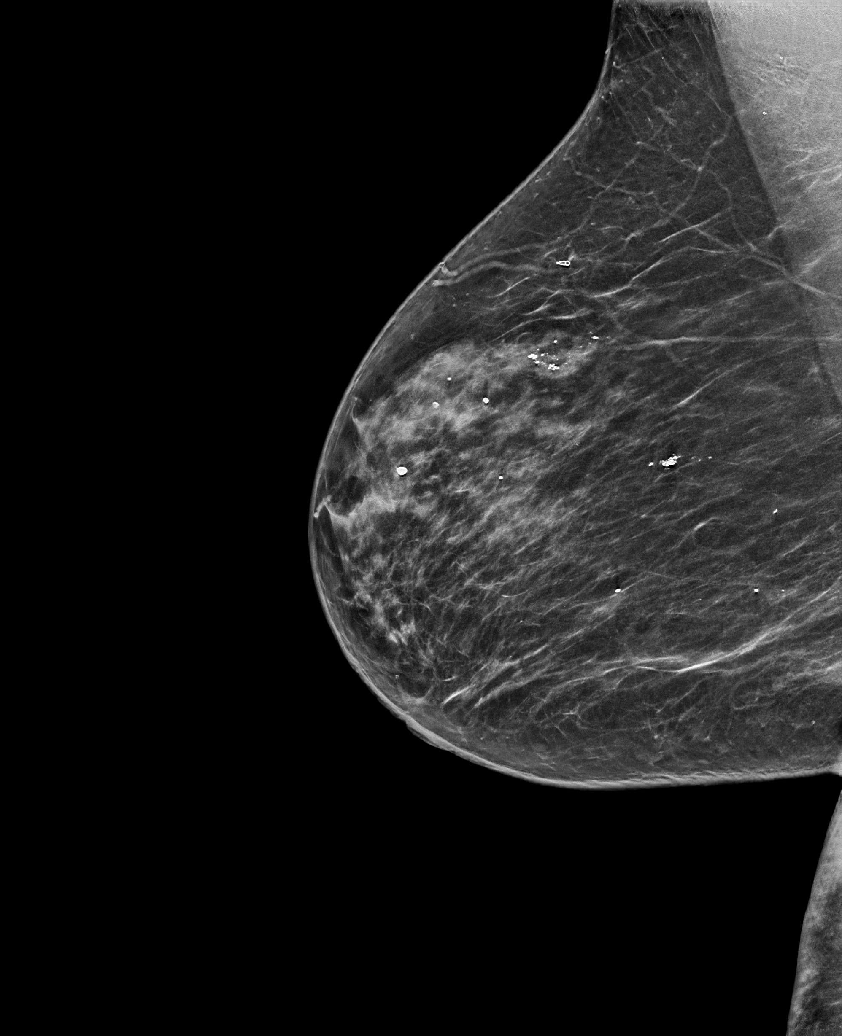

[R CC synth-2D]
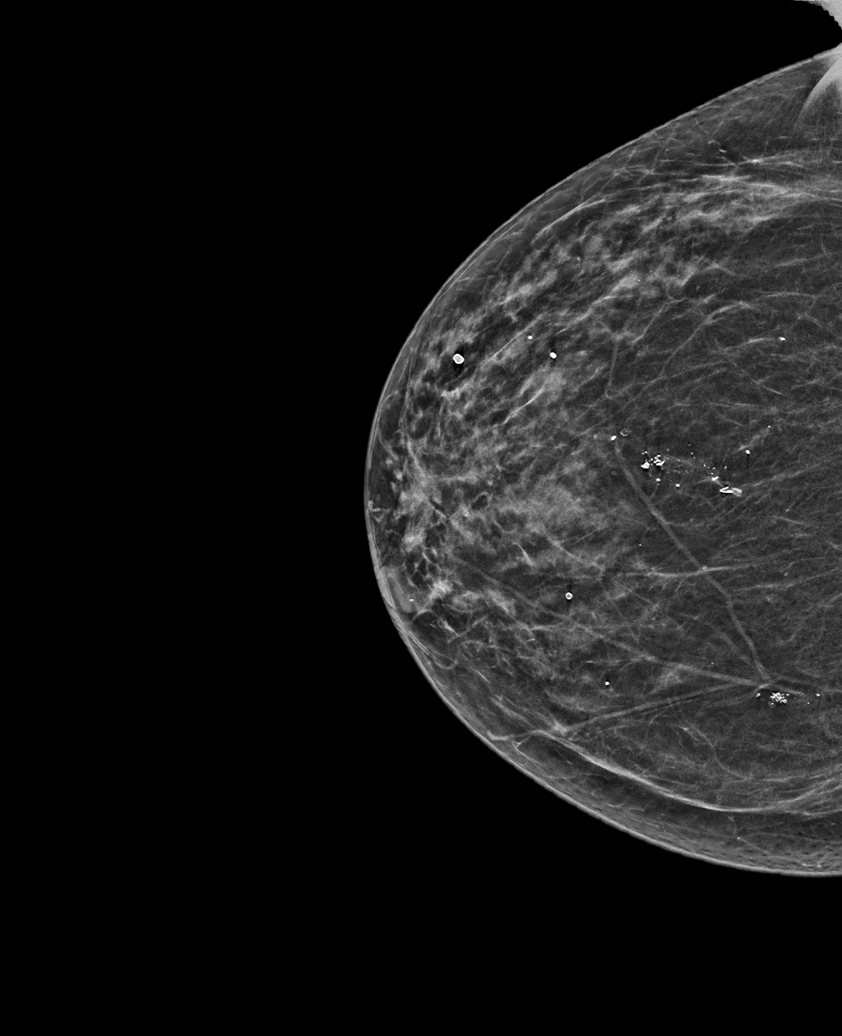

[L MLO synth-2D]
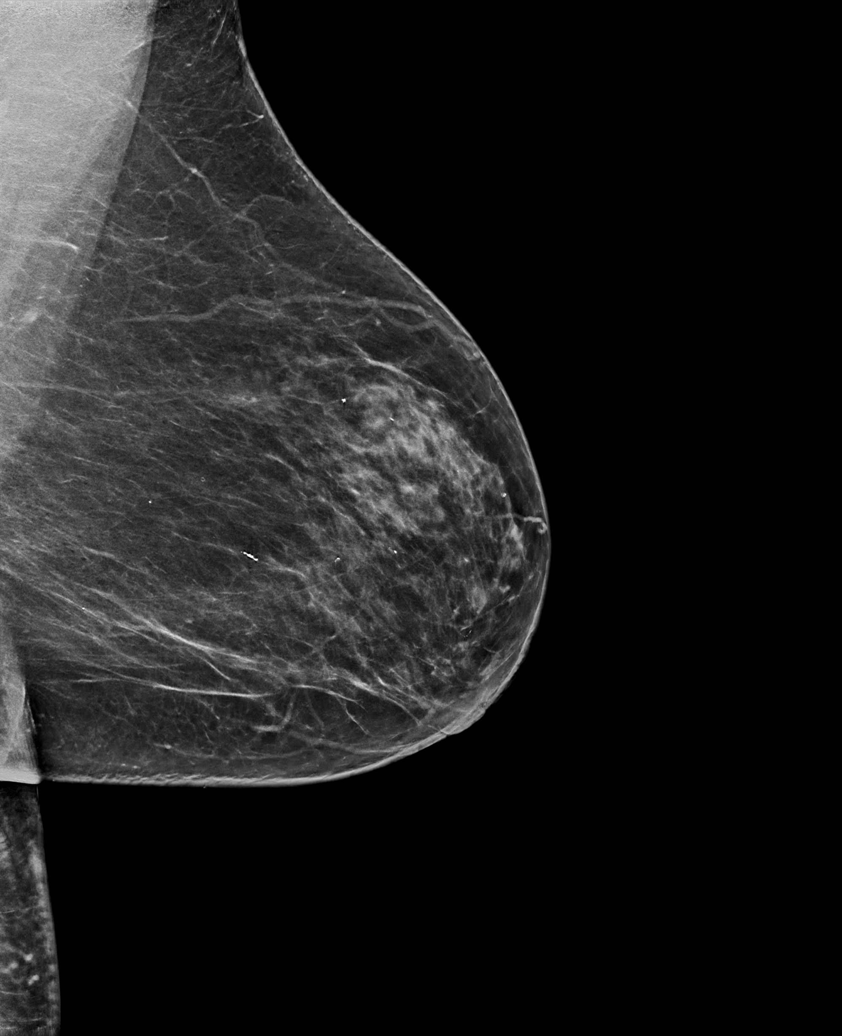

[R CV tomo · tomo slice 25/50.0]
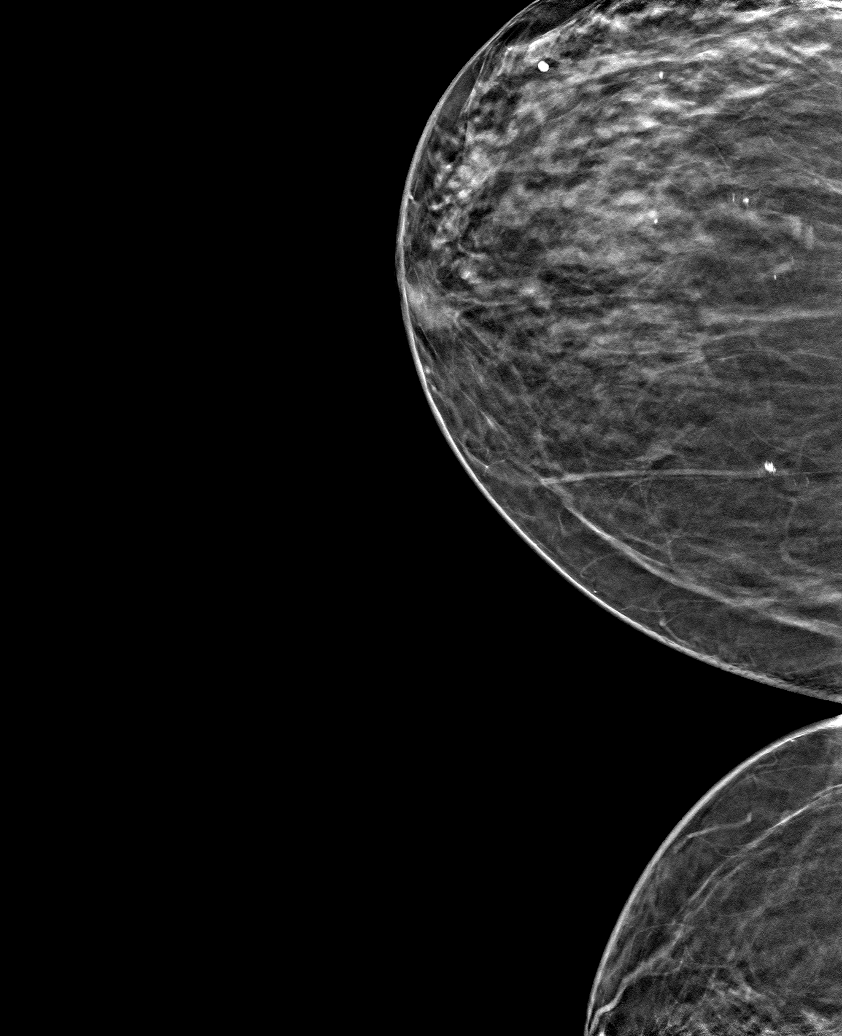

[6 of 30 positions shown; findings below may reference images not displayed]

ACR Breast Density Category b: There are scattered areas of
fibroglandular density.
FINDINGS: There are no findings suspicious for malignancy. Images were
processed with CAD.
IMPRESSION: No mammographic evidence of malignancy. A result letter of this
screening mammogram will be mailed directly to the patient.

RECOMMENDATION:
Screening mammogram in one year. (Code:[id number])

BI-RADS CATEGORY  1: Negative.

## 2020-08-06 ENCOUNTER — Ambulatory Visit (INDEPENDENT_AMBULATORY_CARE_PROVIDER_SITE_OTHER): Payer: Medicare HMO

## 2020-08-06 DIAGNOSIS — H35033 Hypertensive retinopathy, bilateral: Secondary | ICD-10-CM

## 2020-08-06 DIAGNOSIS — E1149 Type 2 diabetes mellitus with other diabetic neurological complication: Secondary | ICD-10-CM

## 2020-08-06 NOTE — Chronic Care Management (AMB) (Signed)
Chronic Care Management   Initial Visit Note  08/06/2020 Name: [name] [name] MRN: [id number] DOB: [birth year]  Primary Care Provider: Birdie Sons, MD Reason for referral : Chronic Care Management   [name] Payer is a 86 y.o. year old female who is a primary care patient of Fisher, Kirstie Peri, MD. The CCM team was consulted for assistance with chronic disease management and care coordination. The initial telephonic outreach was conducted today with [name] [name] and her daughter [name] [name].  Review of [name] [name] status, including review of consultants reports, relevant labs and test results was conducted today. Collaboration with appropriate care team members was performed as part of the comprehensive evaluation and provision of chronic care management services.     SDOH (Social Determinants of Health) assessments performed: Yes See Care Plan activities for detailed interventions related to SDOH  SDOH Interventions     Most Recent Value  SDOH Interventions  Food Insecurity Interventions Intervention Not Indicated  Housing Interventions Intervention Not Indicated  Transportation Interventions Intervention Not Indicated       Medications: Outpatient Encounter Medications as of 08/06/2020  Medication Sig Note  . amLODipine (NORVASC) 5 MG tablet TAKE 1 TABLET EVERY DAY   . aspirin 81 MG tablet Take 1 tablet by mouth daily. 03/27/2015: Received from: [facility]:   . DULoxetine (CYMBALTA) 30 MG capsule TAKE 1 CAPSULE BY MOUTH EVERY DAY   . ezetimibe (ZETIA) 10 MG tablet TAKE 1 TABLET EVERY DAY   . fluticasone (FLONASE) 50 MCG/ACT nasal spray PLACE 2 SPRAYS INTO BOTH NOSTRILS DAILY.   . furosemide (LASIX) 20 MG tablet TAKE 1 TABLET EVERY DAY   . gabapentin (NEURONTIN) 100 MG capsule TAKE 1 CAPSULE EVERY MORNING   . gabapentin (NEURONTIN) 300 MG capsule Take 1 capsule (300 mg total) by mouth at bedtime. In addition to the 171m capsule in the morning    . glipiZIDE (GLUCOTROL XL) 10 MG 24 hr tablet TAKE 1 TABLET EVERY DAY   . levothyroxine (SYNTHROID) 75 MCG tablet TAKE 1 TABLET EVERY DAY   . metFORMIN (GLUCOPHAGE-XR) 750 MG 24 hr tablet TAKE 1 TABLET TWICE DAILY   . oxybutynin (DITROPAN) 5 MG tablet TAKE 1 TABLET  2  TO  3  TIMES  DAILY AS NEEDED  FOR  URINARY FREQUENCY   . quinapril (ACCUPRIL) 40 MG tablet TAKE 2 TABLETS EVERY DAY   . Accu-Chek Softclix Lancets lancets USE TO CHECK BLOOD SUGAR ONCE A DAY   . Alcohol Swabs (B-D SINGLE USE SWABS REGULAR) PADS USE TEST BLOOD SUGAR EVERY DAY   . Blood Glucose Monitoring Suppl (ACCU-CHEK AVIVA PLUS) w/Device KIT USE TO CHECK BLOOD SUGAR ONCE A DAY   . Blood Pressure Monitoring (BLOOD PRESSURE MONITOR/ARM) DEVI Use to check blood pressure of upper arm daily as needed for hypertension (I10)   . donepezil (ARICEPT) 5 MG tablet Take 1 tablet (5 mg total) by mouth at bedtime. (Patient not taking: Reported on 06/21/2020)   . glucose blood (ACCU-CHEK AVIVA PLUS) test strip TEST BLOOD SUGAR ONE TIME DAILY   . Omega-3 Fatty Acids (FISH OIL) 1000 MG CAPS Take 1 capsule by mouth daily. (Patient not taking: Reported on 08/06/2020) 03/27/2015: Received from: [facility] Take by mouth.   No facility-administered encounter medications on file as of 08/06/2020.     Objective:  BP Readings from Last 3 Encounters:  06/21/20 (!) 146/74  03/13/20 (!) 148/62  10/16/19 140/76    Lab Results  Component Value Date   CHOL 206 (H) 03/07/2020   HDL 38 (L) 03/07/2020   LDLCALC 148 (H) 03/07/2020   TRIG 112 03/07/2020   CHOLHDL 5.4 (H) 03/07/2020   Lab Results  Component Value Date   HGBA1C 6.9 (A) 06/21/2020     Goals Addressed            This Visit's Progress   . Chronic Disease Management       CARE PLAN ENTRY (see longitudinal plan of care for additional care plan information)  Current Barriers:  . Chronic Disease Management support and education needs related to  Diabetes, Hypertension and Hypothyroidism. (Hx of chronic back pain)  Nurse Case Manager Clinical Goal(s):  Over the next 120 days, patient: . Will not require hospitalization or emergent care d/t complications r/t chronic illnesses.  . Will take all medications as prescribed. . Will attend all scheduled medical appointments. . Will monitor blood pressure and record readings. . Will monitor fasting blood glucose levels daily and maintain a log.  . Will follow recommended safety precautions to prevent falls and injuries.  Interventions:  . Inter-disciplinary care team collaboration (see longitudinal plan of care) . Reviewed medications. Encouraged to take all medications as prescribed. Encouraged to notify care management team with concerns regarding medication management or prescription costs. [name] [name] is currently assisting with medication preparation. Denies current concerns regarding prescription cost.  . Discussed established blood pressure parameters and indications for notifying PCP. Encouraged to monitor blood pressure daily and record readings. Currently not monitoring d/t not having a BP cuff. Will f/u with insurance provider to request.  . Discussed blood glucose readings along with appropriate interventions for episodes of hypoglycemia/hyperglycemia. Encouraged to monitor fasting blood glucose levels daily and maintain a log. Reports taking medications as prescribed and doing well with recommended diet. Reports fasting readings have been within range. Current reading of 115 mg/dl.  . Discussed activity tolerance. Provided information regarding fall and safety measures. Advised to use an assistive device when ambulating. Encouraged to keep pathways clear and well lit to prevent accidental falls and injuries. Reports difficulty ambulating d/t chronic back pain. Using a walker when ambulating. No recent falls.  . Reviewed scheduled/upcoming provider appointments.  Advised to attend scheduled medical appointments to prevent delays in care. Encouraged to notify the care management team with concerns regarding transportation.  . Discussed plan for ongoing care management and follow-up. Provided direct contact information. Reports her primary concern is limited mobility d/t chronic back pain but reports otherwise doing well. She declines need for in-home assistance. Her daughter/caregiver [name] [name] is readily available to assist as needed. Denies urgent concerns or care management needs. Agreeable to follow-up outreach next [name].    Patient Self Care Activities:  . Attend all scheduled provider appointments . Perform ADL's independently   Initial goal documentation         [name] [name] was given information about Chronic Care Management services including:  1. CCM service includes personalized support from designated clinical staff supervised by her physician, including individualized plan of care and coordination with other care providers 2. 24/7 contact phone numbers for assistance for urgent and routine care needs. 3. Service will only be billed when office clinical staff spend 20 minutes or more in a [name] to coordinate care. 4. Only one practitioner may furnish and bill the service in a calendar [name]. 5. The patient may stop CCM services at any time (effective at the end of the [name]) by phone call  to the office staff. 6. The patient will be responsible for cost sharing (co-pay) of up to 20% of the service fee (after annual deductible is met).  Patient agreed to services and verbal consent obtained.     PLAN A member of the care management team will follow-up with [name] [name].    [facility] [phone]

## 2020-08-14 ENCOUNTER — Other Ambulatory Visit: Payer: Self-pay | Admitting: Family Medicine

## 2020-08-14 NOTE — Patient Instructions (Addendum)
Thank you for allowing the Chronic Care Management team to participate in your care.   Goals Addressed            This Visit's Progress   . Chronic Disease Management       CARE PLAN ENTRY (see longitudinal plan of care for additional care plan information)  Current Barriers:  . Chronic Disease Management support and education needs related to Diabetes, Hypertension and Hypothyroidism. (Hx of chronic back pain)  Nurse Case Manager Clinical Goal(s):  Over the next 120 days, patient: . Will not require hospitalization or emergent care d/t complications r/t chronic illnesses.  . Will take all medications as prescribed. . Will attend all scheduled medical appointments. . Will monitor blood pressure and record readings. . Will monitor fasting blood glucose levels daily and maintain a log.  . Will follow recommended safety precautions to prevent falls and injuries.  Interventions:  . Inter-disciplinary care team collaboration (see longitudinal plan of care) . Reviewed medications. Encouraged to take all medications as prescribed. Encouraged to notify care management team with concerns regarding medication management or prescription costs. [name] [name] daughter [name] [name] is currently assisting with medication preparation. Denies current concerns regarding prescription cost.  . Discussed established blood pressure parameters and indications for notifying PCP. Encouraged to monitor blood pressure daily and record readings. Currently not monitoring d/t not having a BP cuff. Will f/u with insurance provider to request.  . Discussed blood glucose readings along with appropriate interventions for episodes of hypoglycemia/hyperglycemia. Encouraged to monitor fasting blood glucose levels daily and maintain a log. Reports taking medications as prescribed and doing well with recommended diet. Reports fasting readings have been within range. Current reading of 115 mg/dl.  . Discussed activity tolerance.  Provided information regarding fall and safety measures. Advised to use an assistive device when ambulating. Encouraged to keep pathways clear and well lit to prevent accidental falls and injuries. Reports difficulty ambulating d/t chronic back pain. Using a walker when ambulating. No recent falls.  . Reviewed scheduled/upcoming provider appointments. Advised to attend scheduled medical appointments to prevent delays in care. Encouraged to notify the care management team with concerns regarding transportation.  . Discussed plan for ongoing care management and follow-up. Provided direct contact information. Reports her primary concern is limited mobility d/t chronic back pain but reports otherwise doing well. She declines need for in-home assistance. Her daughter/caregiver [name] [name] is readily available to assist as needed. Denies urgent concerns or care management needs. Agreeable to follow-up outreach next month.    Patient Self Care Activities:  . Attend all scheduled provider appointments . Perform ADL's independently   Initial goal documentation        [name] [name] was given information about Chronic Care Management services including:  1. CCM service includes personalized support from designated clinical staff supervised by her physician, including individualized plan of care and coordination with other care providers 2. 24/7 contact phone numbers for assistance for urgent and routine care needs. 3. Service will only be billed when office clinical staff spend 20 minutes or more in a month to coordinate care. 4. Only one practitioner may furnish and bill the service in a calendar month. 5. The patient may stop CCM services at any time (effective at the end of the month) by phone call to the office staff. 6. The patient will be responsible for cost sharing (co-pay) of up to 20% of the service fee (after annual deductible is met).  Patient agreed to services and verbal consent obtained.  [name] [name] and her daughter [name] [name] verbalized understanding of the information discussed during the telephonic outreach today. Declined need for a mailed/printed copy of the information.   A member of the care management team will follow-up with [name] [name] next month.    [name] [name] [facility] Care Management [facility] [phone]

## 2020-08-22 NOTE — Progress Notes (Signed)
Subjective:   [name] [name] is a 86 y.o. female who presents for Medicare Annual (Subsequent) preventive examination.  I connected with Stephens November (daughter- ok per DPR) today by telephone and verified that I am speaking with the correct person using two identifiers. Location patient: home Location provider: work Persons participating in the virtual visit: patient, provider.   I discussed the limitations, risks, security and privacy concerns of performing an evaluation and management service by telephone and the availability of in person appointments. I also discussed with the patient that there may be a patient responsible charge related to this service. The patient expressed understanding and verbally consented to this telephonic visit.    Interactive audio and video telecommunications were attempted between this provider and patient, however failed, due to patient having technical difficulties OR patient did not have access to video capability.  We continued and completed visit with audio only.   Review of Systems    N/A  Cardiac Risk Factors include: advanced age (>17mn, >>39women);diabetes mellitus;dyslipidemia;hypertension;obesity (BMI >30kg/m2)     Objective:    There were no vitals filed for this visit. There is no height or weight on file to calculate BMI.  Advanced Directives 08/26/2020 11/08/2017 07/06/2017 05/27/2015  Does Patient Have a Medical Advance Directive? Yes No No No  Type of [place]amed[place] will - - -  Copy of [place]? No - copy requested - - -  Would patient like information on creating a medical advance directive? - Yes (MAU/Ambulatory/Procedural Areas - Information given) - -    Current Medications (verified) Outpatient Encounter Medications as of 08/26/2020  Medication Sig  . Accu-Chek Softclix Lancets lancets USE TO CHECK BLOOD SUGAR ONCE A DAY  . Alcohol Swabs (B-D SINGLE USE  SWABS REGULAR) PADS USE TEST BLOOD SUGAR EVERY DAY  . amLODipine (NORVASC) 5 MG tablet TAKE 1 TABLET EVERY DAY  . ascorbic acid (VITAMIN C) 500 MG tablet Take 500 mg by mouth daily.  .Marland Kitchenaspirin 81 MG tablet Take 1 tablet by mouth daily.  . Blood Glucose Monitoring Suppl (ACCU-CHEK AVIVA PLUS) w/Device KIT USE TO CHECK BLOOD SUGAR ONCE A DAY  . Blood Pressure Monitoring (BLOOD PRESSURE MONITOR/ARM) DEVI Use to check blood pressure of upper arm daily as needed for hypertension (I10)  . DULoxetine (CYMBALTA) 30 MG capsule TAKE 1 CAPSULE BY MOUTH EVERY DAY  . ezetimibe (ZETIA) 10 MG tablet TAKE 1 TABLET EVERY DAY  . fluticasone (FLONASE) 50 MCG/ACT nasal spray PLACE 2 SPRAYS INTO BOTH NOSTRILS DAILY.  . furosemide (LASIX) 20 MG tablet TAKE 1 TABLET EVERY DAY  . gabapentin (NEURONTIN) 100 MG capsule TAKE 1 CAPSULE EVERY MORNING  . gabapentin (NEURONTIN) 300 MG capsule Take 1 capsule (300 mg total) by mouth at bedtime. In addition to the 1044mcapsule in the morning  . glipiZIDE (GLUCOTROL XL) 10 MG 24 hr tablet TAKE 1 TABLET EVERY DAY  . glucose blood (ACCU-CHEK AVIVA PLUS) test strip TEST BLOOD SUGAR ONE TIME DAILY  . levothyroxine (SYNTHROID) 75 MCG tablet TAKE 1 TABLET EVERY DAY  . metFORMIN (GLUCOPHAGE-XR) 750 MG 24 hr tablet TAKE 1 TABLET TWICE DAILY  . oxybutynin (DITROPAN) 5 MG tablet TAKE 1 TABLET  2  TO  3  TIMES  DAILY AS NEEDED  FOR  URINARY FREQUENCY  . quinapril (ACCUPRIL) 40 MG tablet TAKE 2 TABLETS EVERY DAY  . donepezil (ARICEPT) 5 MG tablet Take 1 tablet (5 mg total)  by mouth at bedtime. (Patient not taking: No sig reported)  . Omega-3 Fatty Acids (FISH OIL) 1000 MG CAPS Take 1 capsule by mouth daily. (Patient not taking: No sig reported)   No facility-administered encounter medications on file as of 08/26/2020.    Allergies (verified) Lovastatin, Pravastatin sodium, and Simvastatin   History: Past Medical History:  Diagnosis Date  . Allergy   . Arthritis   . Cancer ([place])     thryoid  . Cataract   . Diabetes mellitus without complication ([place])   . Hyperlipidemia   . Hypertension   . Thyroid disease    Past Surgical History:  Procedure Laterality Date  . ABDOMINAL HYSTERECTOMY    . BREAST BIOPSY Right ?   benign  . CATARACT EXTRACTION    . THYROIDECTOMY, PARTIAL  1994  . TONSILLECTOMY     Family History  Problem Relation Age of Onset  . Cancer Daughter        breast  . Diabetes Son    Social History   Socioeconomic History  . Marital status: Married    Spouse name: Not on file  . Number of children: 3  . Years of education: Not on file  . Highest education level: 10th grade  Occupational History  . Occupation: Retired   Tobacco Use  . Smoking status: Never Smoker  . Smokeless tobacco: Never Used  Vaping Use  . Vaping Use: Never used  Substance and Sexual Activity  . Alcohol use: No    Alcohol/week: 0.0 standard drinks  . Drug use: No  . Sexual activity: Not on file  Other Topics Concern  . Not on file  Social History Narrative  . Not on file   Social Determinants of Health   Financial Resource Strain: Medium Risk  . Difficulty of Paying Living Expenses: Somewhat hard  Food Insecurity: No Food Insecurity  . Worried About Charity fundraiser in the Last Year: Never true  . Ran Out of Food in the Last Year: Never true  Transportation Needs: No Transportation Needs  . Lack of Transportation (Medical): No  . Lack of Transportation (Non-Medical): No  Physical Activity: Inactive  . Days of Exercise per Week: 0 days  . Minutes of Exercise per Session: 0 min  Stress: No Stress Concern Present  . Feeling of Stress : Not at all  Social Connections: Moderately Isolated  . Frequency of Communication with Friends and Family: Three times a week  . Frequency of Social Gatherings with Friends and Family: More than three times a week  . Attends Religious Services: Never  . Active Member of Clubs or Organizations: No  . Attends Theatre manager Meetings: Never  . Marital Status: Married    Tobacco Counseling Counseling given: Not Answered   Clinical Intake:  Pre-visit preparation completed: Yes  Pain : No/denies pain (Stiffness due to weather.)     Nutritional Risks: None Diabetes: Yes  How often do you need to have someone help you when you read instructions, pamphlets, or other written materials from your doctor or pharmacy?: 1 - Never  Diabetic? Yes  Nutrition Risk Assessment:  Has the patient had any N/V/D within the last 2 months?  No  Does the patient have any non-healing wounds?  No  Has the patient had any unintentional weight loss or weight gain?  No   Diabetes:  Is the patient diabetic?  Yes  If diabetic, was a CBG obtained today?  No  Did the patient  bring in their glucometer from home?  No  How often do you monitor your CBG's? Once a day.   Financial Strains and Diabetes Management:  Are you having any financial strains with the device, your supplies or your medication? No .  Does the patient want to be seen by Chronic Care Management for management of their diabetes?  No  Would the patient like to be referred to a Nutritionist or for Diabetic Management?  No   Diabetic Exams:  Diabetic Eye Exam: Completed 04/01/20 Diabetic Foot Exam: Overdue, Pt has been advised about the importance in completing this exam. Note made to follow up on this at next in office apt.    Interpreter Needed?: No  Information entered by :: [facility], LPN   Activities of Daily Living In your present state of health, do you have any difficulty performing the following activities: 08/26/2020 08/06/2020  Hearing? N N  Vision? Y N  Comment Wears eye glasses. Due for cataract sx on the left eye. Reports cataract extraction  Difficulty concentrating or making decisions? Y N  Walking or climbing stairs? N Y  Comment Avoids steps. Requires assistive device  Dressing or bathing? N N  Doing errands,  shopping? Y Y  Comment Does not drive. Daughter available to assist.  Preparing Food and eating ? Y -  Comment Daughter does all the cooking. -  Using the Toilet? N -  In the past six months, have you accidently leaked urine? Y -  Comment Wears depends at all times. -  Do you have problems with loss of bowel control? N -  Managing your Medications? Y -  Comment Daughter manages medications. -  Managing your Finances? Y -  Comment Daughter manages finances. -  Housekeeping or managing your Housekeeping? Y -  Comment Daughter manages all house keeping. -  Some recent data might be hidden    Patient Care Team: Birdie Sons, MD as PCP - General (Family Medicine) Sharlet Salina, MD as Referring Physician (Physical Medicine and Rehabilitation) Marshall Cork, MD as Consulting Physician (Ophthalmology) Neldon Labella, RN as Case Manager  Indicate any recent Medical Services you may have received from other than [facility] providers in the past year (date may be approximate).     Assessment:   This is a routine wellness examination for [name].  Hearing/Vision screen No exam data present  Dietary issues and exercise activities discussed: Current Exercise Habits: The patient does not participate in regular exercise at present, Exercise limited by: orthopedic condition(s)  Goals    . Chronic Disease Management     CARE PLAN ENTRY (see longitudinal plan of care for additional care plan information)  Current Barriers:  . Chronic Disease Management support and education needs related to Diabetes, Hypertension and Hypothyroidism. (Hx of chronic back pain)  Nurse Case Manager Clinical Goal(s):  Over the next 120 days, patient: . Will not require hospitalization or emergent care d/t complications r/t chronic illnesses.  . Will take all medications as prescribed. . Will attend all scheduled medical appointments. . Will monitor blood pressure and record readings. . Will monitor  fasting blood glucose levels daily and maintain a log.  . Will follow recommended safety precautions to prevent falls and injuries.  Interventions:  . Inter-disciplinary care team collaboration (see longitudinal plan of care) . Reviewed medications. Encouraged to take all medications as prescribed. Encouraged to notify care management team with concerns regarding medication management or prescription costs. Mrs. Ibbotson daughter [name] is currently assisting with medication  preparation. Denies current concerns regarding prescription cost.  . Discussed established blood pressure parameters and indications for notifying PCP. Encouraged to monitor blood pressure daily and record readings. Currently not monitoring d/t not having a BP cuff. Will f/u with insurance provider to request.  . Discussed blood glucose readings along with appropriate interventions for episodes of hypoglycemia/hyperglycemia. Encouraged to monitor fasting blood glucose levels daily and maintain a log. Reports taking medications as prescribed and doing well with recommended diet. Reports fasting readings have been within range. Current reading of 115 mg/dl.  . Discussed activity tolerance. Provided information regarding fall and safety measures. Advised to use an assistive device when ambulating. Encouraged to keep pathways clear and well lit to prevent accidental falls and injuries. Reports difficulty ambulating d/t chronic back pain. Using a walker when ambulating. No recent falls.  . Reviewed scheduled/upcoming provider appointments. Advised to attend scheduled medical appointments to prevent delays in care. Encouraged to notify the care management team with concerns regarding transportation.  . Discussed plan for ongoing care management and follow-up. Provided direct contact information. Reports her primary concern is limited mobility d/t chronic back pain but reports otherwise doing well. She declines need for in-home  assistance. Her daughter/caregiver [name] is readily available to assist as needed. Denies urgent concerns or care management needs. Agreeable to follow-up outreach next month.    Patient Self Care Activities:  . Attend all scheduled provider appointments . Perform ADL's independently   Initial goal documentation     . DIET - REDUCE FRIED FOOD     Recommend avoiding fried foods when having to eat out. Daughter is looking into a food service with healthier food.     Marland Kitchen LIFESTYLE - DECREASE FALLS RISK     Recommend to remove any items from the home that may cause slips or trips.      Depression Screen PHQ 2/9 Scores 08/26/2020 08/06/2020 08/15/2019 11/08/2017 07/06/2017 08/25/2016 08/13/2015  PHQ - 2 Score 1 0 0 2 0 0 0  PHQ- 9 Score - - - 5 - - 1    Fall Risk Fall Risk  08/26/2020 08/06/2020 06/21/2020 08/15/2019 11/08/2017  Falls in the past year? '1 1 1 ' 0 No  Number falls in past yr: 1 0 0 0 -  Injury with Fall? 0 0 0 0 -  Risk for fall due to : Impaired balance/gait;Impaired mobility Impaired balance/gait;Impaired mobility - - -  Follow up Falls prevention discussed Falls prevention discussed - - -    FALL RISK PREVENTION PERTAINING TO THE HOME:  Any stairs in or around the home? Yes  If so, are there any without handrails? No  Home free of loose throw rugs in walkways, pet beds, electrical cords, etc? Yes  Adequate lighting in your home to reduce risk of falls? Yes   ASSISTIVE DEVICES UTILIZED TO PREVENT FALLS:  Life alert? No  Use of a cane, walker or w/c? Yes  Grab bars in the bathroom? Yes  Shower chair or bench in shower? Yes  Elevated toilet seat or a handicapped toilet? Yes    Cognitive Function: Declined at this time.      6CIT Screen 08/25/2016  What Year? 0 points  What month? 0 points  What time? 0 points  Count back from 20 0 points  Months in reverse 0 points  Repeat phrase 4 points  Total Score 4    Immunizations Immunization History   Administered Date(s) Administered  . Fluad Quad(high Dose 65+) 06/14/2019, 06/21/2020  .  Influenza, High Dose Seasonal PF 05/27/2015, 06/24/2016, 07/15/2017, 07/30/2018  . PFIZER SARS-COV-2 Vaccination 10/28/2019, 11/21/2019, 07/11/2020  . Pneumococcal Conjugate-13 08/07/2014  . Pneumococcal Polysaccharide-23 12/31/2000  . Td 02/19/1998  . Tdap 06/20/2011  . Zoster 12/13/2007    TDAP status: Up to date  Flu Vaccine status: Up to date  Pneumococcal vaccine status: Up to date  Covid-19 vaccine status: Completed vaccines  Qualifies for Shingles Vaccine? Yes   Zostavax completed Yes   Shingrix Completed?: No.    Education has been provided regarding the importance of this vaccine. Patient has been advised to call insurance company to determine out of pocket expense if they have not yet received this vaccine. Advised may also receive vaccine at local pharmacy or Health Dept. Verbalized acceptance and understanding.  Screening Tests Health Maintenance  Topic Date Due  . FOOT EXAM  Never done  . MAMMOGRAM  08/30/2019  . DEXA SCAN  04/21/2020  . HEMOGLOBIN A1C  12/20/2020  . COVID-19 Vaccine (4 - Booster for Pfizer series) 01/09/2021  . OPHTHALMOLOGY EXAM  04/01/2021  . TETANUS/TDAP  06/19/2021  . INFLUENZA VACCINE  Completed  . PNA vac Low Risk Adult  Completed    Health Maintenance  Health Maintenance Due  Topic Date Due  . FOOT EXAM  Never done  . MAMMOGRAM  08/30/2019  . DEXA SCAN  04/21/2020    Colorectal cancer screening: No longer required.   Mammogram status: Ordered today. Pt provided with contact info and advised to call to schedule appt.   Bone Density status: Ordered today. Pt provided with contact info and advised to call to schedule appt.  Lung Cancer Screening: (Low Dose CT Chest recommended if Age 51-80 years, 30 pack-year currently smoking OR have quit w/in 15years.) does not qualify.    Additional Screening:  Vision Screening: Recommended annual  ophthalmology exams for early detection of glaucoma and other disorders of the eye. Is the patient up to date with their annual eye exam?  Yes  Who is the provider or what is the name of the office in which the patient attends annual eye exams? Dr Kathlen Mody If pt is not established with a provider, would they like to be referred to a provider to establish care? No .   Dental Screening: Recommended annual dental exams for proper oral hygiene  Community Resource Referral / Chronic Care Management: CRR required this visit?  No   CCM required this visit?  No      Plan:     I have personally reviewed and noted the following in the patient's chart:   . Medical and social history . Use of alcohol, tobacco or illicit drugs  . Current medications and supplements . Functional ability and status . Nutritional status . Physical activity . Advanced directives . List of other physicians . Hospitalizations, surgeries, and ER visits in previous 12 months . Vitals . Screenings to include cognitive, depression, and falls . Referrals and appointments  In addition, I have reviewed and discussed with patient certain preventive protocols, quality metrics, and best practice recommendations. A written personalized care plan for preventive services as well as general preventive health recommendations were provided to patient.     Tanis Burnley [place], [place]   [id number]   Nurse Notes: Pt needs a diabetic foot exam at next in office apt.

## 2020-08-26 ENCOUNTER — Other Ambulatory Visit: Payer: Self-pay

## 2020-08-26 ENCOUNTER — Ambulatory Visit (INDEPENDENT_AMBULATORY_CARE_PROVIDER_SITE_OTHER): Payer: Medicare HMO

## 2020-08-26 DIAGNOSIS — Z0000 Encounter for general adult medical examination without abnormal findings: Secondary | ICD-10-CM | POA: Diagnosis not present

## 2020-08-26 DIAGNOSIS — E2839 Other primary ovarian failure: Secondary | ICD-10-CM | POA: Diagnosis not present

## 2020-08-26 DIAGNOSIS — Z1231 Encounter for screening mammogram for malignant neoplasm of breast: Secondary | ICD-10-CM | POA: Diagnosis not present

## 2020-08-26 NOTE — Patient Instructions (Signed)
[name] [name] , Thank you for taking time to come for your Medicare Wellness Visit. I appreciate your ongoing commitment to your health goals. Please review the following plan we discussed and let me know if I can assist you in the future.   Screening recommendations/referrals: Colonoscopy: No longer required.  Mammogram: Ordered today. Daughter to schedule with DEXA scan. Bone Density: Ordered today. Daughter aware office will call GO:TLXB.  Recommended yearly ophthalmology/optometry visit for glaucoma screening and checkup Recommended yearly dental visit for hygiene and checkup  Vaccinations:/ Influenza vaccine: Done 06/21/20 Pneumococcal vaccine: Completed series Tdap vaccine: Up to date, due 06/2021 Shingles vaccine: Shingrix discussed. Please contact your pharmacy for coverage information.     Advanced directives: Please bring a copy of your POA (Power of Attorney) and/or Living Will to your next appointment.   Conditions/risks identified: Fall risk preventatives discussed today.   Next appointment: 09/03/20 @ 10:00 AM for a CCM call    Preventive Care 84 Years and Older, Female Preventive care refers to lifestyle choices and visits with your health care provider that can promote health and wellness. What does preventive care include?  A yearly physical exam. This is also called an annual well check.  Dental exams once or twice a year.  Routine eye exams. Ask your health care provider how often you should have your eyes checked.  Personal lifestyle choices, including:  Daily care of your teeth and gums.  Regular physical activity.  Eating a healthy diet.  Avoiding tobacco and drug use.  Limiting alcohol use.  Practicing safe sex.  Taking low-dose aspirin every day.  Taking vitamin and mineral supplements as recommended by your health care provider. What happens during an annual well check? The services and screenings done by your health care provider during your  annual well check will depend on your age, overall health, lifestyle risk factors, and family history of disease. Counseling  Your health care provider may ask you questions about your:  Alcohol use.  Tobacco use.  Drug use.  Emotional well-being.  Home and relationship well-being.  Sexual activity.  Eating habits.  History of falls.  Memory and ability to understand (cognition).  Work and work Statistician.  Reproductive health. Screening  You may have the following tests or measurements:  Height, weight, and BMI.  Blood pressure.  Lipid and cholesterol levels. These may be checked every 5 years, or more frequently if you are over 84 years old.  Skin check.  Lung cancer screening. You may have this screening every year starting at age 84 if you have a 30-pack-year history of smoking and currently smoke or have quit within the past 15 years.  Fecal occult blood test (FOBT) of the stool. You may have this test every year starting at age 84.  Flexible sigmoidoscopy or colonoscopy. You may have a sigmoidoscopy every 5 years or a colonoscopy every 10 years starting at age 84.  Hepatitis C blood test.  Hepatitis B blood test.  Sexually transmitted disease (STD) testing.  Diabetes screening. This is done by checking your blood sugar (glucose) after you have not eaten for a while (fasting). You may have this done every 1-3 years.  Bone density scan. This is done to screen for osteoporosis. You may have this done starting at age 84.  Mammogram. This may be done every 1-2 years. Talk to your health care provider about how often you should have regular mammograms. Talk with your health care provider about your test results, treatment options,  and if necessary, the need for more tests. Vaccines  Your health care provider may recommend certain vaccines, such as:  Influenza vaccine. This is recommended every year.  Tetanus, diphtheria, and acellular pertussis (Tdap, Td)  vaccine. You may need a Td booster every 10 years.  Zoster vaccine. You may need this after age 55.  Pneumococcal 13-valent conjugate (PCV13) vaccine. One dose is recommended after age 84.  Pneumococcal polysaccharide (PPSV23) vaccine. One dose is recommended after age 84. Talk to your health care provider about which screenings and vaccines you need and how often you need them. This information is not intended to replace advice given to you by your health care provider. Make sure you discuss any questions you have with your health care provider. Document Released: 09/27/2015 Document Revised: 05/20/2016 Document Reviewed: 07/02/2015 Elsevier Interactive Patient Education  2017 [place] Prevention in the Home Falls can cause injuries. They can happen to people of all ages. There are many things you can do to make your home safe and to help prevent falls. What can I do on the outside of my home?  Regularly fix the edges of walkways and driveways and fix any cracks.  Remove anything that might make you trip as you walk through a door, such as a raised step or threshold.  Trim any bushes or trees on the path to your home.  Use bright outdoor lighting.  Clear any walking paths of anything that might make someone trip, such as rocks or tools.  Regularly check to see if handrails are loose or broken. Make sure that both sides of any steps have handrails.  Any raised decks and porches should have guardrails on the edges.  Have any leaves, snow, or ice cleared regularly.  Use sand or salt on walking paths during winter.  Clean up any spills in your garage right away. This includes oil or grease spills. What can I do in the bathroom?  Use night lights.  Install grab bars by the toilet and in the tub and shower. Do not use towel bars as grab bars.  Use non-skid mats or decals in the tub or shower.  If you need to sit down in the shower, use a plastic, non-slip  stool.  Keep the floor dry. Clean up any water that spills on the floor as soon as it happens.  Remove soap buildup in the tub or shower regularly.  Attach bath mats securely with double-sided non-slip rug tape.  Do not have throw rugs and other things on the floor that can make you trip. What can I do in the bedroom?  Use night lights.  Make sure that you have a light by your bed that is easy to reach.  Do not use any sheets or blankets that are too big for your bed. They should not hang down onto the floor.  Have a firm chair that has side arms. You can use this for support while you get dressed.  Do not have throw rugs and other things on the floor that can make you trip. What can I do in the kitchen?  Clean up any spills right away.  Avoid walking on wet floors.  Keep items that you use a lot in easy-to-reach places.  If you need to reach something above you, use a strong step stool that has a grab bar.  Keep electrical cords out of the way.  Do not use floor polish or wax that makes floors slippery. If  you must use wax, use non-skid floor wax.  Do not have throw rugs and other things on the floor that can make you trip. What can I do with my stairs?  Do not leave any items on the stairs.  Make sure that there are handrails on both sides of the stairs and use them. Fix handrails that are broken or loose. Make sure that handrails are as long as the stairways.  Check any carpeting to make sure that it is firmly attached to the stairs. Fix any carpet that is loose or worn.  Avoid having throw rugs at the top or bottom of the stairs. If you do have throw rugs, attach them to the floor with carpet tape.  Make sure that you have a light switch at the top of the stairs and the bottom of the stairs. If you do not have them, ask someone to add them for you. What else can I do to help prevent falls?  Wear shoes that:  Do not have high heels.  Have rubber bottoms.  Are  comfortable and fit you well.  Are closed at the toe. Do not wear sandals.  If you use a stepladder:  Make sure that it is fully opened. Do not climb a closed stepladder.  Make sure that both sides of the stepladder are locked into place.  Ask someone to hold it for you, if possible.  Clearly mark and make sure that you can see:  Any grab bars or handrails.  First and last steps.  Where the edge of each step is.  Use tools that help you move around (mobility aids) if they are needed. These include:  Canes.  Walkers.  Scooters.  Crutches.  Turn on the lights when you go into a dark area. Replace any light bulbs as soon as they burn out.  Set up your furniture so you have a clear path. Avoid moving your furniture around.  If any of your floors are uneven, fix them.  If there are any pets around you, be aware of where they are.  Review your medicines with your doctor. Some medicines can make you feel dizzy. This can increase your chance of falling. Ask your doctor what other things that you can do to help prevent falls. This information is not intended to replace advice given to you by your health care provider. Make sure you discuss any questions you have with your health care provider. Document Released: 06/27/2009 Document Revised: 02/06/2016 Document Reviewed: 10/05/2014 Elsevier Interactive Patient Education  2017 [employer].

## 2020-08-27 ENCOUNTER — Other Ambulatory Visit: Payer: Self-pay | Admitting: Family Medicine

## 2020-08-27 DIAGNOSIS — E1149 Type 2 diabetes mellitus with other diabetic neurological complication: Secondary | ICD-10-CM

## 2020-09-03 ENCOUNTER — Ambulatory Visit: Payer: Medicare HMO

## 2020-09-03 DIAGNOSIS — E1149 Type 2 diabetes mellitus with other diabetic neurological complication: Secondary | ICD-10-CM

## 2020-09-03 DIAGNOSIS — M5136 Other intervertebral disc degeneration, lumbar region: Secondary | ICD-10-CM

## 2020-09-03 DIAGNOSIS — R29898 Other symptoms and signs involving the musculoskeletal system: Secondary | ICD-10-CM

## 2020-09-03 NOTE — Chronic Care Management (AMB) (Signed)
  Chronic Care Management   Outreach Note  09/03/2020 Name: [name] [name] MRN: [id number] DOB: [birth year]  Primary Care Provider: Birdie Sons, MD Reason for referral : Chronic Care Management   Message received from the [place] Nurse regarding financial concerns and assistance with durable medical equipment. Successful outreach with [name] daughter/caregiver today to discuss specific needs. A Care Guide referral will be placed today.     Follow Up Plan:  Will place referral for outreach with a [place] and touch base with [name] caregiver next week.     Cristy [facility] Care Management [facility] [phone]

## 2020-09-04 ENCOUNTER — Telehealth: Payer: Self-pay | Admitting: Family Medicine

## 2020-09-04 NOTE — Telephone Encounter (Signed)
[name] [name] 09/04/2020 Called pt regarding community resource referral received. My info is [phone] please see ref notes for more details. Thank you.  [name] [name] Care Guide, Embedded Care Coordination [place], Care Management  

## 2020-09-05 ENCOUNTER — Telehealth: Payer: Self-pay | Admitting: Family Medicine

## 2020-09-05 NOTE — Telephone Encounter (Signed)
[name] [name] 09/05/2020 Called pt regarding community resource referral received. My info is [phone] please see ref notes for more details. Thank you.  [place], Care Management

## 2020-09-27 ENCOUNTER — Telehealth: Payer: Self-pay | Admitting: Family Medicine

## 2020-09-27 NOTE — Telephone Encounter (Signed)
   Telephone encounter was:  Successful.  09/27/2020 Name: [name] [name] MRN: [id number] DOB: 09/15/1932  [name] [name] is a 87 y.o. year old female who is a primary care patient of Caryn Section, Kirstie Peri, MD . The community resource team was consulted for assistance with Financial Difficulties related to dental work needed.   Care guide performed the following interventions: Patient provided with information about care guide support team and interviewed to confirm resource needs Investigation of community resources performed Discussed resources to assist with dental resources at a discounted rate.  I sent [name] her daughter a list of local dental places with little to no costs to have her mother's teeth fixed. She currently has applied for the [facility] program and I gave her the application for the [place] program.  Follow Up Plan:  No further follow up planned at this time. The patient has been provided with needed resources.  [place], Care Management Phone: [phone] Email: [email or web address]

## 2020-10-01 ENCOUNTER — Other Ambulatory Visit: Payer: Medicare HMO

## 2020-10-03 ENCOUNTER — Other Ambulatory Visit: Payer: Self-pay | Admitting: Family Medicine

## 2020-10-03 MED ORDER — LEVOTHYROXINE SODIUM 75 MCG PO TABS
75.0000 ug | ORAL_TABLET | Freq: Every day | ORAL | 0 refills | Status: DC
Start: 2020-10-03 — End: 2020-10-17

## 2020-10-03 NOTE — Telephone Encounter (Signed)
Medication Refill - Medication: levothyroxine (SYNTHROID) 75 MCG tablet  Mail delivery pharmacy didn't send pts medication so she [facility] until the normal pharmacy gets her meds in order / please advise   Has the patient contacted their pharmacy? Yes.   (Agent: If no, request that the patient contact the pharmacy for the refill.) (Agent: If yes, when and what did the pharmacy advise?)call pcp for a refill   Preferred Pharmacy (with phone number or street name):  [id number] [place] [address] Phone:  [phone]  Fax:  [phone]       Agent: Please be advised that RX refills may take up to 3 business days. We ask that you follow-up with your pharmacy.

## 2020-10-10 ENCOUNTER — Other Ambulatory Visit: Payer: Medicare HMO

## 2020-10-17 ENCOUNTER — Other Ambulatory Visit: Payer: Self-pay | Admitting: Family Medicine

## 2020-10-17 MED ORDER — LEVOTHYROXINE SODIUM 75 MCG PO TABS: 75.0000 ug | ORAL_TABLET | Freq: Every day | ORAL | 0 refills | Status: DC

## 2020-10-24 ENCOUNTER — Other Ambulatory Visit: Payer: Self-pay | Admitting: Family Medicine

## 2020-10-24 NOTE — Telephone Encounter (Signed)
Requested medications are due for refill today yes  Requested medications are on the active medication list yes  Last refill 11/16  Last visit 11/16  Future visit scheduled 11/05/2020  Notes to clinic If this med is being prescribed for depression I do not see it addressed in [place], does have appt soon, please assess.

## 2020-11-04 ENCOUNTER — Ambulatory Visit: Payer: Medicare HMO | Admitting: Family Medicine

## 2020-11-05 ENCOUNTER — Encounter: Payer: Self-pay | Admitting: Family Medicine

## 2020-11-05 ENCOUNTER — Ambulatory Visit (INDEPENDENT_AMBULATORY_CARE_PROVIDER_SITE_OTHER): Payer: Medicare HMO | Admitting: Family Medicine

## 2020-11-05 ENCOUNTER — Other Ambulatory Visit: Payer: Self-pay

## 2020-11-05 VITALS — BP 124/74 | HR 75 | Temp 98.7°F | Resp 16 | Wt 177.0 lb

## 2020-11-05 DIAGNOSIS — R296 Repeated falls: Secondary | ICD-10-CM

## 2020-11-05 DIAGNOSIS — E1149 Type 2 diabetes mellitus with other diabetic neurological complication: Secondary | ICD-10-CM

## 2020-11-05 DIAGNOSIS — E785 Hyperlipidemia, unspecified: Secondary | ICD-10-CM | POA: Diagnosis not present

## 2020-11-05 DIAGNOSIS — M8580 Other specified disorders of bone density and structure, unspecified site: Secondary | ICD-10-CM | POA: Diagnosis not present

## 2020-11-05 DIAGNOSIS — E538 Deficiency of other specified B group vitamins: Secondary | ICD-10-CM | POA: Diagnosis not present

## 2020-11-05 DIAGNOSIS — R413 Other amnesia: Secondary | ICD-10-CM | POA: Diagnosis not present

## 2020-11-05 DIAGNOSIS — G4710 Hypersomnia, unspecified: Secondary | ICD-10-CM

## 2020-11-05 DIAGNOSIS — E039 Hypothyroidism, unspecified: Secondary | ICD-10-CM | POA: Diagnosis not present

## 2020-11-05 DIAGNOSIS — I10 Essential (primary) hypertension: Secondary | ICD-10-CM

## 2020-11-05 NOTE — Progress Notes (Signed)
Established patient visit   Patient: [name] [name]   DOB: Jul 06, 1933   87 y.o. Female  MRN: [id number] Visit Date: 11/05/2020  Today's healthcare provider: Lelon Huh, MD   Chief Complaint  Patient presents with  . Diabetes  . Hypertension   Subjective    HPI  Diabetes Mellitus Type II, Follow-up  Lab Results  Component Value Date   HGBA1C 6.9 (A) 06/21/2020   HGBA1C 7.3 (H) 03/07/2020   HGBA1C 6.6 (A) 10/16/2019   Wt Readings from Last 3 Encounters:  11/05/20 177 lb (80.3 kg)  06/21/20 183 lb (83 kg)  03/13/20 188 lb (85.3 kg)   Last seen for diabetes 4 months ago.  Management since then includes continuing same medications. She reports good compliance with treatment. She is not having side effects.  Symptoms: No fatigue No foot ulcerations  No appetite changes No nausea  No paresthesia of the feet  No polydipsia  No polyuria No visual disturbances   No vomiting     Home blood sugar records: blood sugars are not checked  Episodes of hypoglycemia? No    Current insulin regiment: none Most Recent Eye Exam: 04/01/2020 Current exercise: none Current diet habits: well balanced  Pertinent Labs: Lab Results  Component Value Date   CHOL 206 (H) 03/07/2020   HDL 38 (L) 03/07/2020   LDLCALC 148 (H) 03/07/2020   TRIG 112 03/07/2020   CHOLHDL 5.4 (H) 03/07/2020   Lab Results  Component Value Date   NA 142 03/07/2020   K 4.0 03/07/2020   CREATININE 0.87 03/07/2020   GFRNONAA 61 03/07/2020   GFRAA 70 03/07/2020   GLUCOSE 136 (H) 03/07/2020     --------------------------------------------------------------------------------------------------- Hypertension, follow-up  BP Readings from Last 3 Encounters:  11/05/20 124/74  06/21/20 (!) 146/74  03/13/20 (!) 148/62   Wt Readings from Last 3 Encounters:  11/05/20 177 lb (80.3 kg)  06/21/20 183 lb (83 kg)  03/13/20 188 lb (85.3 kg)     She was last seen for hypertension 4 months ago.  BP at  that visit was 146/74. Management since that visit includes continuing same medications. SBP was mildly elevated. She was to work on avoiding sodium in he diet.  She reports good compliance with treatment. She is not having side effects.  She is following a Regular diet. She is not exercising. She does not smoke.  Use of agents associated with hypertension: NSAIDS.   Outside blood pressures are not checked.  ---------------------------------------------------------------------------------------------------  Frequent Falls: Patient reports having several falls since the last visit. He last fall was 1 week ago. She states she falls asleep and falls to the floor. She has hit her head a couple of times during the falls. Patient denies any loss of consciousness resulting from the falls. She sleeps poorly at night and has remote history of sleep apnea but does not use CPAP.      Medications: Outpatient Medications Prior to Visit  Medication Sig  . Accu-Chek Softclix Lancets lancets USE TO CHECK BLOOD SUGAR ONCE A DAY  . Alcohol Swabs (B-D SINGLE USE SWABS REGULAR) PADS USE TEST BLOOD SUGAR EVERY DAY  . amLODipine (NORVASC) 5 MG tablet TAKE 1 TABLET EVERY DAY  . ascorbic acid (VITAMIN C) 500 MG tablet Take 500 mg by mouth daily.  Marland Kitchen aspirin 81 MG tablet Take 1 tablet by mouth daily.  . Blood Glucose Monitoring Suppl (ACCU-CHEK AVIVA PLUS) w/Device KIT USE TO CHECK BLOOD SUGAR ONCE A DAY  .  Blood Pressure Monitoring (BLOOD PRESSURE MONITOR/ARM) DEVI Use to check blood pressure of upper arm daily as needed for hypertension (I10)  . DULoxetine (CYMBALTA) 30 MG capsule TAKE 1 CAPSULE BY MOUTH EVERY DAY  . ezetimibe (ZETIA) 10 MG tablet TAKE 1 TABLET EVERY DAY  . fluticasone (FLONASE) 50 MCG/ACT nasal spray PLACE 2 SPRAYS INTO BOTH NOSTRILS DAILY.  . furosemide (LASIX) 20 MG tablet TAKE 1 TABLET EVERY DAY  . gabapentin (NEURONTIN) 100 MG capsule TAKE 1 CAPSULE EVERY MORNING  . gabapentin  (NEURONTIN) 300 MG capsule Take 1 capsule (300 mg total) by mouth at bedtime. In addition to the 174m capsule in the morning  . glipiZIDE (GLUCOTROL XL) 10 MG 24 hr tablet TAKE 1 TABLET EVERY DAY  . glucose blood (ACCU-CHEK AVIVA PLUS) test strip TEST BLOOD SUGAR ONE TIME DAILY  . levothyroxine (SYNTHROID) 75 MCG tablet Take 1 tablet (75 mcg total) by mouth daily.  . metFORMIN (GLUCOPHAGE-XR) 750 MG 24 hr tablet TAKE 1 TABLET TWICE DAILY  . oxybutynin (DITROPAN) 5 MG tablet TAKE 1 TABLET  2  TO  3  TIMES  DAILY AS NEEDED  FOR  URINARY FREQUENCY  . quinapril (ACCUPRIL) 40 MG tablet TAKE 2 TABLETS EVERY DAY  . Omega-3 Fatty Acids (FISH OIL) 1000 MG CAPS Take 1 capsule by mouth daily. (Patient not taking: Reported on 11/05/2020)  . [DISCONTINUED] donepezil (ARICEPT) 5 MG tablet Take 1 tablet (5 mg total) by mouth at bedtime. (Patient not taking: Reported on 11/05/2020)   No facility-administered medications prior to visit.    Review of Systems  Constitutional: Negative for appetite change, chills, fatigue and fever.  Respiratory: Negative for chest tightness and shortness of breath.   Cardiovascular: Negative for chest pain and palpitations.  Gastrointestinal: Negative for abdominal pain, nausea and vomiting.  Neurological: Negative for dizziness and weakness.  Psychiatric/Behavioral:       Memory problems       Objective    BP 124/74 (BP Location: Right Arm, Patient Position: Sitting, Cuff Size: Normal)   Pulse 75   Temp 98.7 F (37.1 C) (Temporal)   Resp 16   Wt 177 lb (80.3 kg)   LMP  (Approximate)   BMI 30.38 kg/m     Physical Exam    General: Appearance:    Obese female in no acute distress  Eyes:    PERRL, conjunctiva/corneas clear, EOM's intact       Lungs:     Clear to auscultation bilaterally, respirations unlabored  Heart:    Normal heart rate. Normal rhythm. No murmurs, rubs, or gallops.   MS:   All extremities are intact.   Neurologic:   Awake, alert, oriented x  2. Knows the day of the week, but not month or date and thinks it is 2002.         Assessment & Plan     1. Primary hypertension Well controlled.  Continue current medications.    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  - Hemoglobin A1c  3. Hypothyroidism, unspecified type  - TSH  4. Hyperlipidemia, unspecified hyperlipidemia type She is tolerating pravastatin well with no adverse effects.   - CBC - Lipid panel - Comprehensive metabolic panel  5. B12 deficiency  - Vitamin B12  6. Memory change Off of Aricept since it caused sever mood swings.  - VITAMIN D 25 Hydroxy (Vit-D Deficiency, Fractures) - Refer neurology   7. Hypersomnia  - Home sleep test - Refer neurology  8. Frequent falls Patient states she is falling asleep frequently during the day when seating including in the bathroom resulting in her falling.    - Refer neurology        The entirety of the information documented in the History of Present Illness, Review of Systems and Physical Exam were personally obtained by me. Portions of this information were initially documented by the CMA and reviewed by me for thoroughness and accuracy.      Lelon Huh, MD  [facility] [phone] (phone) [phone] (fax)  [place]

## 2020-11-06 ENCOUNTER — Other Ambulatory Visit: Payer: Self-pay | Admitting: Family Medicine

## 2020-11-06 LAB — LIPID PANEL
Chol/HDL Ratio: 5.1 ratio — ABNORMAL HIGH (ref 0.0–4.4)
Cholesterol, Total: 210 mg/dL — ABNORMAL HIGH (ref 100–199)
HDL: 41 mg/dL (ref >39)
LDL Chol Calc (NIH): 151 mg/dL — ABNORMAL HIGH (ref 0–99)
Triglycerides: 98 mg/dL (ref 0–149)
VLDL Cholesterol Cal: 18 mg/dL (ref 5–40)

## 2020-11-06 LAB — COMPREHENSIVE METABOLIC PANEL
ALT: 21 IU/L (ref 0–32)
AST: 27 IU/L (ref 0–40)
Albumin/Globulin Ratio: 1.2 (ref 1.2–2.2)
Albumin: 4.3 g/dL (ref 3.6–4.6)
Alkaline Phosphatase: 67 IU/L (ref 44–121)
BUN/Creatinine Ratio: 10 — ABNORMAL LOW (ref 12–28)
BUN: 9 mg/dL (ref 8–27)
Bilirubin Total: 0.7 mg/dL (ref 0.0–1.2)
CO2: 25 mmol/L (ref 20–29)
Calcium: 10.2 mg/dL (ref 8.7–10.3)
Chloride: 98 mmol/L (ref 96–106)
Creatinine, Ser: 0.89 mg/dL (ref 0.57–1.00)
GFR calc Af Amer: 68 mL/min/{1.73_m2} (ref >59)
GFR calc non Af Amer: 59 mL/min/{1.73_m2} — ABNORMAL LOW (ref >59)
Globulin, Total: 3.5 g/dL (ref 1.5–4.5)
Glucose: 76 mg/dL (ref 65–99)
Potassium: 4.6 mmol/L (ref 3.5–5.2)
Sodium: 139 mmol/L (ref 134–144)
Total Protein: 7.8 g/dL (ref 6.0–8.5)

## 2020-11-06 LAB — CBC
Hematocrit: 41.2 % (ref 34.0–46.6)
Hemoglobin: 13.5 g/dL (ref 11.1–15.9)
MCH: 27.9 pg (ref 26.6–33.0)
MCHC: 32.8 g/dL (ref 31.5–35.7)
MCV: 85 fL (ref 79–97)
Platelets: 308 10*3/uL (ref 150–450)
RBC: 4.84 x10E6/uL (ref 3.77–5.28)
RDW: 14.2 % (ref 11.7–15.4)
WBC: 8 10*3/uL (ref 3.4–10.8)

## 2020-11-06 LAB — HEMOGLOBIN A1C
Est. average glucose Bld gHb Est-mCnc: 143 mg/dL
Hgb A1c MFr Bld: 6.6 % — ABNORMAL HIGH (ref 4.8–5.6)

## 2020-11-06 LAB — TSH: TSH: 0.437 u[IU]/mL — ABNORMAL LOW (ref 0.450–4.500)

## 2020-11-06 LAB — VITAMIN D 25 HYDROXY (VIT D DEFICIENCY, FRACTURES): Vit D, 25-Hydroxy: 32.8 ng/mL (ref 30.0–100.0)

## 2020-11-06 LAB — VITAMIN B12: Vitamin B-12: 183 pg/mL — ABNORMAL LOW (ref 232–1245)

## 2020-11-07 ENCOUNTER — Telehealth: Payer: Self-pay | Admitting: Family Medicine

## 2020-11-07 DIAGNOSIS — E2839 Other primary ovarian failure: Secondary | ICD-10-CM

## 2020-11-07 DIAGNOSIS — Z1231 Encounter for screening mammogram for malignant neoplasm of breast: Secondary | ICD-10-CM

## 2020-11-07 NOTE — Telephone Encounter (Signed)
[name] [name] states they need a new order for mammogram and bone density,Thanks

## 2020-11-08 NOTE — Telephone Encounter (Signed)
New orders placed. Please schedule.  ?

## 2020-11-25 ENCOUNTER — Ambulatory Visit (INDEPENDENT_AMBULATORY_CARE_PROVIDER_SITE_OTHER): Payer: Medicare HMO

## 2020-11-25 DIAGNOSIS — E1149 Type 2 diabetes mellitus with other diabetic neurological complication: Secondary | ICD-10-CM

## 2020-11-25 DIAGNOSIS — R296 Repeated falls: Secondary | ICD-10-CM

## 2020-11-25 NOTE — Chronic Care Management (AMB) (Signed)
Chronic Care Management   Follow Up Note   11/25/2020 Name: [name] [name] MRN: [id number] DOB: 09-10-1933  Primary Care Provider: Birdie Sons, MD Reason for referral : Chronic Care Management   [name] [name] is a 87 y.o. year old female who is a primary care patient of [name] [name], [name] Peri, MD. She is currently engaged with the chronic care management team. A routine telephonic outreach was conducted today with her daughter/caregiver [name] [name].  Review of [name] status, including review of consultants reports, relevant labs and test results was conducted today. Collaboration with appropriate care team members was performed as part of the comprehensive evaluation and provision of chronic care management services.    SDOH (Social Determinants of Health) assessments performed: No    Outpatient Encounter Medications as of 11/25/2020  Medication Sig Note  . Accu-Chek Softclix Lancets lancets USE TO CHECK BLOOD SUGAR ONCE A DAY   . Alcohol Swabs (B-D SINGLE USE SWABS REGULAR) PADS USE TEST BLOOD SUGAR EVERY DAY   . amLODipine (NORVASC) 5 MG tablet TAKE 1 TABLET EVERY DAY   . ascorbic acid (VITAMIN C) 500 MG tablet Take 500 mg by mouth daily.   Marland Kitchen aspirin 81 MG tablet Take 1 tablet by mouth daily. 03/27/2015: Received from: [place]:   . Blood Glucose Monitoring Suppl (ACCU-CHEK AVIVA PLUS) w/Device KIT USE TO CHECK BLOOD SUGAR ONCE A DAY   . Blood Pressure Monitoring (BLOOD PRESSURE MONITOR/ARM) DEVI Use to check blood pressure of upper arm daily as needed for hypertension (I10)   . DULoxetine (CYMBALTA) 30 MG capsule TAKE 1 CAPSULE BY MOUTH EVERY DAY   . ezetimibe (ZETIA) 10 MG tablet TAKE 1 TABLET EVERY DAY   . fluticasone (FLONASE) 50 MCG/ACT nasal spray PLACE 2 SPRAYS INTO BOTH NOSTRILS DAILY.   . furosemide (LASIX) 20 MG tablet TAKE 1 TABLET EVERY DAY   . gabapentin (NEURONTIN) 100 MG capsule TAKE 1 CAPSULE EVERY MORNING   . gabapentin  (NEURONTIN) 300 MG capsule Take 1 capsule (300 mg total) by mouth at bedtime. In addition to the 168m capsule in the morning   . glipiZIDE (GLUCOTROL XL) 10 MG 24 hr tablet TAKE 1 TABLET EVERY DAY   . glucose blood (ACCU-CHEK AVIVA PLUS) test strip TEST BLOOD SUGAR ONE TIME DAILY   . levothyroxine (SYNTHROID) 75 MCG tablet Take 1 tablet (75 mcg total) by mouth daily.   . metFORMIN (GLUCOPHAGE-XR) 750 MG 24 hr tablet TAKE 1 TABLET TWICE DAILY   . Omega-3 Fatty Acids (FISH OIL) 1000 MG CAPS Take 1 capsule by mouth daily. (Patient not taking: Reported on 11/05/2020) 03/27/2015: Received from: [place] by mouth.  . oxybutynin (DITROPAN) 5 MG tablet TAKE 1 TABLET  2  TO  3  TIMES  DAILY AS NEEDED  FOR  URINARY FREQUENCY   . quinapril (ACCUPRIL) 40 MG tablet TAKE 2 TABLETS EVERY DAY    No facility-administered encounter medications on file as of 11/25/2020.     Objective:  Patient Care Plan: Fall Risk (Adult)    Problem Identified: Fall Risk     Long-Range Goal: Absence of Fall and Fall-Related Injury   Start Date: 11/25/2020  Expected End Date: 03/14/2021  Priority: High  Note:   Current Barriers:  . High Risk for Falls r/t unsteady gait r/t chronic joint and back pain  Clinical Goal(s):  .Marland KitchenOver the next 120 days, patient will not experience falls or require emergent care d/t  fall related injuries  Interventions:  . Collaboration with Birdie Sons, MD regarding development and update of comprehensive plan of care as evidenced by provider attestation and co-signature . Inter-disciplinary care team collaboration (see longitudinal plan of care) . Reviewed medications and discussed potential side effects such as dizziness, lightheadedness and frequent urination. . Provided information regarding safety and fall prevention. Encouraged daughter to ensure [name] uses her cane or walker when ambulating. Advised to ensure she wears fitted skid free  footwear and avoids prolonged activity to prevent accidental falls.  . Discussed ability to perform ADL's and tasks in the home. Daughter reports she can perform self-care, but gait is unsteady d/t chronic back and joint pain. Her daughter remains in the home and is available to assist. She agreed to update the care management team if her ability to function declines and additional assistance is needed in the home.    Self-Care Deficits/Patient Goals:  Over the next 120 days, caregiver will: -Ensure patient utilizes assistive device appropriately with all ambulation -Ensure pathways are clear and well lit -Ensure patient wears secure fitting, skid free footwear at all times when ambulating -Notify provider or care management team for questions and new concerns as needed   Follow Up Plan:  Will follow up next month   Patient Care Plan: Diabetes Type 2 (Adult)    Problem Identified: Glycemic Management (Diabetes, Type 2)     Long-Range Goal: Glycemic Management Optimized   Start Date: 11/25/2020  Expected End Date: 03/14/2021  Priority: High  Note:   Objective:  Lab Results  Component Value Date   HGBA1C 6.6 (H) 11/05/2020 .   Lab Results  Component Value Date   CREATININE 0.89 11/05/2020   CREATININE 0.87 03/07/2020   CREATININE 0.86 04/10/2019 .   Marland Kitchen No results found for: EGFR  Current Barriers:  . Chronic Disease Management support and educational needs r/t Diabetes management  Case Manager Clinical Goal(s):  Marland Kitchen Over the next 120 days, patient will demonstrate improved adherence to prescribed treatment plan for Diabetes management as evidenced by taking medications as prescribed, monitoring and recording of CBG, and adherence to an ADA/ carb modified diet.  Interventions:  . Collaboration with Birdie Sons, MD regarding development and update of comprehensive plan of care as evidenced by provider attestation and co-signature . Inter-disciplinary care team collaboration  (see longitudinal plan of care) . Reviewed medications. Daughter prepares her medications and ensures she takes them as prescribed. Encouraged to update the care management team with concerns regarding prescription cost. . Provided information regarding importance of consistent blood glucose monitoring. Daughter reports difficulty monitoring levels d/t patient refusing. Encouraged to continue attempting to monitor and maintain a log. . Reviewed s/sx of hypoglycemia and hyperglycemia along with recommended interventions. . Discussed nutritional intake. Daughter prepares the daily meals. Reports doing well with nutritional intake and attempting to maintain a heart healthy/diabetic diet. . Discussed importance of completing recommended DM preventive care. She is due for a foot exam. Eye exam was completed within the last year.   Patient Goals/Self-Care Activities Over the next 120 days, caregiver will: -Ensure patient takes medications as prescribed -Ensure patient attends all scheduled provider appointments -Attempt to monitor blood glucose levels consistently and utilize recommended interventions -Adhere to prescribed ADA/carb modified diet when preparing meals -Notify provider or care management team with questions and new concerns as needed  Follow Up Plan:  Will follow up next month       PLAN A member of the  care management team will follow up next month.     Cristy Friedlander Health/[facility] Care Management [facility] [phone]

## 2020-11-27 NOTE — Patient Instructions (Signed)
Thank you for allowing the Chronic Care Management team to participate in your care.    Patient Care Plan: Fall Risk (Adult)    Problem Identified: Fall Risk     Long-Range Goal: Absence of Fall and Fall-Related Injury   Start Date: 11/25/2020  Expected End Date: 03/14/2021  Priority: High  Note:   Current Barriers:  . High Risk for Falls r/t unsteady gait r/t chronic joint and back pain  Clinical Goal(s):  Marland Kitchen Over the next 120 days, patient will not experience falls or require emergent care d/t fall related injuries  Interventions:  . Collaboration with [name] Sons, MD regarding development and update of comprehensive plan of care as evidenced by provider attestation and co-signature . Inter-disciplinary care team collaboration (see longitudinal plan of care) . Reviewed medications and discussed potential side effects such as dizziness, lightheadedness and frequent urination. . Provided information regarding safety and fall prevention. Encouraged daughter to ensure [name] [name] uses her cane or walker when ambulating. Advised to ensure she wears fitted skid free footwear and avoids prolonged activity to prevent accidental falls.  . Discussed ability to perform ADL's and tasks in the home. Daughter reports she can perform self-care, but gait is unsteady d/t chronic back and joint pain. Her daughter remains in the home and is available to assist. She agreed to update the care management team if her ability to function declines and additional assistance is needed in the home.    Self-Care Deficits/Patient Goals:  Over the next 120 days, caregiver will: -Ensure patient utilizes assistive device appropriately with all ambulation -Ensure pathways are clear and well lit -Ensure patient wears secure fitting, skid free footwear at all times when ambulating -Notify provider or care management team for questions and new concerns as needed   Follow Up Plan:  Will follow up next month    Patient Care Plan: Diabetes Type 2 (Adult)    Problem Identified: Glycemic Management (Diabetes, Type 2)     Long-Range Goal: Glycemic Management Optimized   Start Date: 11/25/2020  Expected End Date: 03/14/2021  Priority: High  Note:   Objective:  Lab Results  Component Value Date   HGBA1C 6.6 (H) 11/05/2020 .   Lab Results  Component Value Date   CREATININE 0.89 11/05/2020   CREATININE 0.87 03/07/2020   CREATININE 0.86 04/10/2019 .   Marland Kitchen No results found for: EGFR  Current Barriers:  . Chronic Disease Management support and educational needs r/t Diabetes management  Case Manager Clinical Goal(s):  Marland Kitchen Over the next 120 days, patient will demonstrate improved adherence to prescribed treatment plan for Diabetes management as evidenced by taking medications as prescribed, monitoring and recording of CBG, and adherence to an ADA/ carb modified diet.  Interventions:  . Collaboration with [name] Sons, MD regarding development and update of comprehensive plan of care as evidenced by provider attestation and co-signature . Inter-disciplinary care team collaboration (see longitudinal plan of care) . Reviewed medications. Daughter prepares her medications and ensures she takes them as prescribed. Encouraged to update the care management team with concerns regarding prescription cost. . Provided information regarding importance of consistent blood glucose monitoring. Daughter reports difficulty monitoring levels d/t patient refusing. Encouraged to continue attempting to monitor and maintain a log. . Reviewed s/sx of hypoglycemia and hyperglycemia along with recommended interventions. . Discussed nutritional intake. Daughter prepares the daily meals. Reports doing well with nutritional intake and attempting to maintain a heart healthy/diabetic diet. . Discussed importance of completing recommended DM preventive  care. She is due for a foot exam. Eye exam was completed within the last year.    Patient Goals/Self-Care Activities Over the next 120 days, caregiver will: -Ensure patient takes medications as prescribed -Ensure patient attends all scheduled provider appointments -Attempt to monitor blood glucose levels consistently and utilize recommended interventions -Adhere to prescribed ADA/carb modified diet when preparing meals -Notify provider or care management team with questions and new concerns as needed  Follow Up Plan:  Will follow up next month            [name] [name] daughter [name] [name] verbalized understanding of the information discussed during the telephonic outreach today. Declined need for mailed/printed instructions. A member of the care management team will follow up next month.     [name] [name] Health/[facility] Care Management [facility] [phone]

## 2020-11-29 ENCOUNTER — Other Ambulatory Visit: Payer: Self-pay | Admitting: Family Medicine

## 2020-11-29 DIAGNOSIS — E1149 Type 2 diabetes mellitus with other diabetic neurological complication: Secondary | ICD-10-CM

## 2020-12-02 ENCOUNTER — Ambulatory Visit: Payer: Self-pay

## 2020-12-02 NOTE — Chronic Care Management (AMB) (Signed)
  Chronic Care Management   Note  12/02/2020 Name: [name] [name] MRN: [id number] DOB: Feb 15, 1933   Call received from Mrs. [facility] daughter/caregiver [name]. Reports [name] recently lost her son and attempting to  prepare for the funeral services. [name] notes that [name] has been anxious since his death. She is requesting an order for medication/anxiolytic to help [name] through the funeral service on tomorrow.    Follow up plan: Request forwarded to Dr. Caryn Section. Will follow-up with [name] regarding Dr. Maralyn Sago recommendation.    Cristy Friedlander [facility] Care Management [facility] [phone]

## 2020-12-03 ENCOUNTER — Ambulatory Visit: Payer: Medicare HMO

## 2020-12-03 ENCOUNTER — Other Ambulatory Visit: Payer: Self-pay | Admitting: Family Medicine

## 2020-12-03 DIAGNOSIS — I10 Essential (primary) hypertension: Secondary | ICD-10-CM

## 2020-12-03 DIAGNOSIS — F439 Reaction to severe stress, unspecified: Secondary | ICD-10-CM

## 2020-12-03 DIAGNOSIS — E1149 Type 2 diabetes mellitus with other diabetic neurological complication: Secondary | ICD-10-CM

## 2020-12-03 MED ORDER — ALPRAZOLAM 0.25 MG PO TABS: 0.2500 mg | ORAL_TABLET | Freq: Four times a day (QID) | ORAL | 0 refills | Status: DC | PRN

## 2020-12-03 NOTE — Progress Notes (Signed)
Patient very upset at death of family member. See telephone message.

## 2020-12-03 NOTE — Chronic Care Management (AMB) (Unsigned)
Chronic Care Management   Follow Up Note   12/03/2020 Name: [name] [name] MRN: [id number] DOB: [birth year]  Primary Care Provider: Birdie Sons, MD Reason for referral : Chronic Care Management   [name] Bracher is a 87 y.o. year old female who is a primary care patient of Birdie Sons, MD. She is currently enrolled in the chronic care management program.  Review of Mrs. Mkrtchyan  status, including review of consultants reports, relevant labs and test results was conducted today. Collaboration with appropriate care team members was performed as part of the comprehensive evaluation and provision of chronic care management services.    SDOH (Social Determinants of Health) assessments performed: No   Outpatient Encounter Medications as of 12/03/2020  Medication Sig Note  . Accu-Chek Softclix Lancets lancets USE TO CHECK BLOOD SUGAR ONCE A DAY   . Alcohol Swabs (B-D SINGLE USE SWABS REGULAR) PADS USE TEST BLOOD SUGAR EVERY DAY   . ALPRAZolam (XANAX) 0.25 MG tablet Take 1 tablet (0.25 mg total) by mouth every 6 (six) hours as needed for anxiety.   Marland Kitchen amLODipine (NORVASC) 5 MG tablet TAKE 1 TABLET EVERY DAY   . ascorbic acid (VITAMIN C) 500 MG tablet Take 500 mg by mouth daily.   Marland Kitchen aspirin 81 MG tablet Take 1 tablet by mouth daily. 03/27/2015: Received from: [place]:   . Blood Glucose Monitoring Suppl (ACCU-CHEK AVIVA PLUS) w/Device KIT USE TO CHECK BLOOD SUGAR ONCE A DAY   . Blood Pressure Monitoring (BLOOD PRESSURE MONITOR/ARM) DEVI Use to check blood pressure of upper arm daily as needed for hypertension (I10)   . DULoxetine (CYMBALTA) 30 MG capsule TAKE 1 CAPSULE BY MOUTH EVERY DAY   . ezetimibe (ZETIA) 10 MG tablet TAKE 1 TABLET EVERY DAY   . fluticasone (FLONASE) 50 MCG/ACT nasal spray PLACE 2 SPRAYS INTO BOTH NOSTRILS DAILY.   . furosemide (LASIX) 20 MG tablet TAKE 1 TABLET EVERY DAY   . gabapentin (NEURONTIN) 100 MG capsule TAKE 1 CAPSULE  EVERY MORNING   . gabapentin (NEURONTIN) 300 MG capsule Take 1 capsule (300 mg total) by mouth at bedtime. In addition to the 144m capsule in the morning   . glipiZIDE (GLUCOTROL XL) 10 MG 24 hr tablet TAKE 1 TABLET EVERY DAY   . glucose blood (ACCU-CHEK AVIVA PLUS) test strip TEST BLOOD SUGAR ONE TIME DAILY   . levothyroxine (SYNTHROID) 75 MCG tablet Take 1 tablet (75 mcg total) by mouth daily.   . metFORMIN (GLUCOPHAGE-XR) 750 MG 24 hr tablet TAKE 1 TABLET TWICE DAILY   . Omega-3 Fatty Acids (FISH OIL) 1000 MG CAPS Take 1 capsule by mouth daily. (Patient not taking: Reported on 11/05/2020) 03/27/2015: [place] by [place].  . oxybutynin (DITROPAN) 5 MG tablet TAKE 1 TABLET 2 TO 3 TIMES DAILY AS NEEDED FOR  URINARY FREQUENCY   . quinapril (ACCUPRIL) 40 MG tablet TAKE 2 TABLETS EVERY DAY    No facility-administered encounter medications on file as of 12/03/2020.     Objective:  Patient Care Plan: Anxiety (Adult)  Problem Identified: Symptoms (Anxiety)   Goal: Anxiety Symptoms Monitored and Managed   Start Date: 12/03/2020  Expected End Date: 01/02/2021  Priority: High  Note:   Current Barriers:  . Situational stress and grief r/t loss of a family member.   Clinical Goal(s):  .Marland KitchenOver the next 30 days, patient will express feelings in an appropriate manner and proceed with the  acceptance of losing her son. . Over the next 30 days, patient will maintain current functional status and continue activities of daily living   Interventions:   Collaboration with Caryn Section, Kirstie Peri, MD regarding development and update of comprehensive plan of care as evidenced by provider attestation and co-signature  Inter-disciplinary care team collaboration (see longitudinal plan of care)  Discussed current coping strategies and treatment options with patient's caregiver/daughter [name]. Reports patient is attempting to deal with grief but has experienced  increased anxiety over the past few days. Denies ideations of self harm. Reports she has very good family support. Declines need for grief/bereavement counseling but feels her mom would benefit from a short term medication to lessen her anxiety. She is especially concerned about her ability to maintain her composure during the funeral services.  Discussed with Dr. Caryn Section. Xanax 0.46m ordered.THelene Kelp will ensure her mom takes 1 tablet every six hours as needed for anxiety. We discussed indications for follow up if the anxiety worsens or her moms functional abilities seem to decline.  Encouraged to update the care management team if her mom agrees to grief/bereavement counseling.    Self Care Activities/Patient Goals: Over the next 30 days, patient's caregiver will: -Ensure patient takes medications as instructed -Monitor patient's behavior and notify provider if her anxiety worsens -Update the care management team if patient agrees to grief/bereavement counseling -[place] providing emotional support and notify the care team if additional resources are needed    Follow Up Plan:  Will follow up within the next 30 days.        PLAN A member of the care management team will follow up within the next 30 days.    FCristy FriedlanderHealth/[facility]N Care Management [phone]

## 2020-12-04 NOTE — Patient Instructions (Signed)
Thank you for allowing the Chronic Care Management team to participate in your care.   Goals Addressed: Patient Care Plan: Anxiety (Adult)  Problem Identified: Symptoms (Anxiety)   Goal: Anxiety Symptoms Monitored and Managed   Start Date: 12/03/2020  Expected End Date: 01/02/2021  Priority: High  Note:   Current Barriers:  . Situational stress and grief r/t loss of a family member.   Clinical Goal(s):  Marland Kitchen Over the next 30 days, patient will express feelings in an appropriate manner and proceed with the acceptance of losing her son. . Over the next 30 days, patient will maintain current functional status and continue activities of daily living   Interventions:   Collaboration with Caryn Section, Kirstie Peri, MD regarding development and update of comprehensive plan of care as evidenced by provider attestation and co-signature  Inter-disciplinary care team collaboration (see longitudinal plan of care)  Discussed current coping strategies and treatment options with patient's caregiver/daughter [name]. Reports patient is attempting to deal with grief but has experienced increased anxiety over the past few days. Denies ideations of self harm. Reports she has very good family support. Declines need for grief/bereavement counseling but feels her mom would benefit from a short term medication to lessen her anxiety. She is especially concerned about her ability to maintain her composure during the funeral services.  Discussed with Dr. Caryn Section. Xanax 0.25mg  ordered.[name]  will ensure her mom takes 1 tablet every six hours as needed for anxiety. We discussed indications for follow up if the anxiety worsens or her moms functional abilities seem to decline.  Encouraged to update the care management team if her mom agrees to grief/bereavement counseling.    Self Care Activities/Patient Goals: Over the next 30 days, patient's caregiver will: -Ensure patient takes medications as instructed -Monitor patient's  behavior and notify provider if her anxiety worsens -Update the care management team if patient agrees to grief/bereavement counseling -[place] providing emotional support and notify the care team if additional resources are needed    Follow Up Plan:  Will follow up within the next 30 days.         Mrs. [place] daughter/caregiver [name] verbalized understanding of the information discussed during the telephonic outreach today. Declined need for mailed/printed instructions. A member of the care management team will follow up within the next 30 days.    Cristy Friedlander [facility] Care Management [facility] [phone]

## 2020-12-10 ENCOUNTER — Other Ambulatory Visit: Payer: Medicare HMO

## 2021-01-06 ENCOUNTER — Ambulatory Visit: Payer: Medicare HMO

## 2021-01-06 DIAGNOSIS — E1149 Type 2 diabetes mellitus with other diabetic neurological complication: Secondary | ICD-10-CM

## 2021-01-06 DIAGNOSIS — I10 Essential (primary) hypertension: Secondary | ICD-10-CM

## 2021-01-06 NOTE — Chronic Care Management (AMB) (Signed)
  Chronic Care Management    01/06/2021 Name: [name] [name] MRN: [id number] DOB: 05-Jan-1933   Brief outreach with [name] [name] daughter/caregiver [name] [name]. She was busy at the time of the call but reports [name] [name] is doing well. Denies urgent needs. Agreed to follow-up when she is available.  Outpatient Encounter Medications as of 01/06/2021  Medication Sig Note  . Accu-Chek Softclix Lancets lancets USE TO CHECK BLOOD SUGAR ONCE A DAY   . Alcohol Swabs (B-D SINGLE USE SWABS REGULAR) PADS USE TEST BLOOD SUGAR EVERY DAY   . ALPRAZolam (XANAX) 0.25 MG tablet Take 1 tablet (0.25 mg total) by mouth every 6 (six) hours as needed for anxiety.   Marland Kitchen amLODipine (NORVASC) 5 MG tablet TAKE 1 TABLET EVERY DAY   . ascorbic acid (VITAMIN C) 500 MG tablet Take 500 mg by mouth daily.   Marland Kitchen aspirin 81 MG tablet Take 1 tablet by mouth daily. 03/27/2015: Received from: [place]:   . Blood Glucose Monitoring Suppl (ACCU-CHEK AVIVA PLUS) w/Device KIT USE TO CHECK BLOOD SUGAR ONCE A DAY   . Blood Pressure Monitoring (BLOOD PRESSURE MONITOR/ARM) DEVI Use to check blood pressure of upper arm daily as needed for hypertension (I10)   . DULoxetine (CYMBALTA) 30 MG capsule TAKE 1 CAPSULE BY MOUTH EVERY DAY   . ezetimibe (ZETIA) 10 MG tablet TAKE 1 TABLET EVERY DAY   . fluticasone (FLONASE) 50 MCG/ACT nasal spray PLACE 2 SPRAYS INTO BOTH NOSTRILS DAILY.   . furosemide (LASIX) 20 MG tablet TAKE 1 TABLET EVERY DAY   . gabapentin (NEURONTIN) 100 MG capsule TAKE 1 CAPSULE EVERY MORNING   . gabapentin (NEURONTIN) 300 MG capsule Take 1 capsule (300 mg total) by mouth at bedtime. In addition to the $[place]'100mg'zMqSL$  capsule in the morning   . glipiZIDE (GLUCOTROL XL) 10 MG 24 hr tablet TAKE 1 TABLET EVERY DAY   . glucose blood (ACCU-CHEK AVIVA PLUS) test strip TEST BLOOD SUGAR ONE TIME DAILY   . levothyroxine (SYNTHROID) 75 MCG tablet Take 1 tablet (75 mcg total) by mouth daily.   . metFORMIN  (GLUCOPHAGE-XR) 750 MG 24 hr tablet TAKE 1 TABLET TWICE DAILY   . Omega-3 Fatty Acids (FISH OIL) 1000 MG CAPS Take 1 capsule by mouth daily. (Patient not taking: Reported on 11/05/2020) 03/27/2015: Received from: [place]: Take by mouth.  . oxybutynin (DITROPAN) 5 MG tablet TAKE 1 TABLET 2 TO 3 TIMES DAILY AS NEEDED FOR  URINARY FREQUENCY   . quinapril (ACCUPRIL) 40 MG tablet TAKE 2 TABLETS EVERY DAY    No facility-administered encounter medications on file as of 01/06/2021.       PLAN [name] [name] daughter/caregiver [name] [name] will follow-up when she is available.   Cristy Friedlander [facility] Care Management [facility] [phone]

## 2021-01-22 ENCOUNTER — Other Ambulatory Visit: Payer: Self-pay | Admitting: Family Medicine

## 2021-03-07 ENCOUNTER — Ambulatory Visit: Payer: Self-pay

## 2021-03-07 ENCOUNTER — Ambulatory Visit: Payer: Medicare HMO | Admitting: Family Medicine

## 2021-03-07 ENCOUNTER — Telehealth: Payer: Medicare HMO | Admitting: Family Medicine

## 2021-03-07 DIAGNOSIS — E1149 Type 2 diabetes mellitus with other diabetic neurological complication: Secondary | ICD-10-CM

## 2021-03-07 DIAGNOSIS — R296 Repeated falls: Secondary | ICD-10-CM

## 2021-03-07 NOTE — Progress Notes (Deleted)
Established patient visit   Patient: [name] [name]   DOB: [birth year]   87 y.o. Female  MRN: [id number] Visit Date: 03/07/2021  Today's healthcare provider: Lelon Huh, MD   No chief complaint on file.  Subjective    HPI  Diabetes Mellitus Type II, Follow-up  Lab Results  Component Value Date   HGBA1C 6.6 (H) 11/05/2020   HGBA1C 6.9 (A) 06/21/2020   HGBA1C 7.3 (H) 03/07/2020   Wt Readings from Last 3 Encounters:  11/05/20 177 lb (80.3 kg)  06/21/20 183 lb (83 kg)  03/13/20 188 lb (85.3 kg)   Last seen for diabetes 4 months ago.  Management since then includes continuing same medications. She reports {excellent/good/fair/poor:19665} compliance with treatment. She {is/is not:21021397} having side effects. {document side effects if present:1} Symptoms: {Yes/No:20286} fatigue {Yes/No:20286} foot ulcerations  {Yes/No:20286} appetite changes {Yes/No:20286} nausea  {Yes/No:20286} paresthesia of the feet  {Yes/No:20286} polydipsia  {Yes/No:20286} polyuria {Yes/No:20286} visual disturbances   {Yes/No:20286} vomiting     Home blood sugar records: {diabetes glucometry results:16657}  Episodes of hypoglycemia? {Yes/No:20286} {enter symptoms and frequency of symptoms if yes:1}   Current insulin regiment: none Most Recent Eye Exam: 04/01/2020 {Current exercise:16438:::1} {Current diet habits:16563:::1}  Pertinent Labs: Lab Results  Component Value Date   CHOL 210 (H) 11/05/2020   HDL 41 11/05/2020   LDLCALC 151 (H) 11/05/2020   TRIG 98 11/05/2020   CHOLHDL 5.1 (H) 11/05/2020   Lab Results  Component Value Date   NA 139 11/05/2020   K 4.6 11/05/2020   CREATININE 0.89 11/05/2020   GFRNONAA 59 (L) 11/05/2020   GFRAA 68 11/05/2020   GLUCOSE 76 11/05/2020     ---------------------------------------------------------------------------------------------------  Hypertension, follow-up  BP Readings from Last 3 Encounters:  11/05/20 124/74  06/21/20 (!)  146/74  03/13/20 (!) 148/62   Wt Readings from Last 3 Encounters:  11/05/20 177 lb (80.3 kg)  06/21/20 183 lb (83 kg)  03/13/20 188 lb (85.3 kg)     She was last seen for hypertension 4 months ago.  BP at that visit was 124/74. Management since that visit includes continue same medication.  She reports {excellent/good/fair/poor:19665} compliance with treatment. She {is/is not:9024} having side effects. {document side effects if present:1} She is following a {diet:21022986} diet. She {is/is not:9024} exercising. She {does/does not:200015} smoke.  Use of agents associated with hypertension: NSAIDS.   Outside blood pressures are {***enter patient reported home BP readings, or 'not being checked':1}. Symptoms: {Yes/No:20286} chest pain {Yes/No:20286} chest pressure  {Yes/No:20286} palpitations {Yes/No:20286} syncope  {Yes/No:20286} dyspnea {Yes/No:20286} orthopnea  {Yes/No:20286} paroxysmal nocturnal dyspnea {Yes/No:20286} lower extremity edema   Pertinent labs: Lab Results  Component Value Date   CHOL 210 (H) 11/05/2020   HDL 41 11/05/2020   LDLCALC 151 (H) 11/05/2020   TRIG 98 11/05/2020   CHOLHDL 5.1 (H) 11/05/2020   Lab Results  Component Value Date   NA 139 11/05/2020   K 4.6 11/05/2020   CREATININE 0.89 11/05/2020   GFRNONAA 59 (L) 11/05/2020   GFRAA 68 11/05/2020   GLUCOSE 76 11/05/2020     The ASCVD Risk score (Goff DC Jr., et al., 2013) failed to calculate for the following reasons:   The 2013 ASCVD risk score is only valid for ages 101 to 6   ---------------------------------------------------------------------------------------------------   Hypothyroid, follow-up  Lab Results  Component Value Date   TSH 0.437 (L) 11/05/2020   TSH 1.230 03/07/2020   TSH 0.828 04/10/2019   Wt Readings from Last 3 Encounters:  11/05/20 177 lb (80.3 kg)  06/21/20 183 lb (83 kg)  03/13/20 188 lb (85.3 kg)    She was last seen for hypothyroid 4 months ago.  Management  since that visit includes continue same medications. She reports {excellent/good/fair/poor:19665} compliance with treatment. She {is/is not:21021397} having side effects. {document side effects if present:1}  Symptoms: {Yes/No:20286} change in energy level {Yes/No:20286} constipation  {Yes/No:20286} diarrhea {Yes/No:20286} heat / cold intolerance  {Yes/No:20286} nervousness {Yes/No:20286} palpitations  {Yes/No:20286} weight changes    -----------------------------------------------------------------------------------------   Follow up for Vitamin B12 Deficiency:  The patient was last seen for this 4 months ago. Changes made at last visit include advising patient to start taking OTC vitamin B12 1000 mg once every day.  She reports {excellent/good/fair/poor:19665} compliance with treatment. She feels that condition is {improved/worse/unchanged:3041574}. She {is/is not:21021397} having side effects. ***  -----------------------------------------------------------------------------------------   {Show patient history (optional):23778}   Medications: Outpatient Medications Prior to Visit  Medication Sig   Accu-Chek Softclix Lancets lancets USE TO CHECK BLOOD SUGAR ONCE A DAY   Alcohol Swabs (B-D SINGLE USE SWABS REGULAR) PADS USE TEST BLOOD SUGAR EVERY DAY   ALPRAZolam (XANAX) 0.25 MG tablet Take 1 tablet (0.25 mg total) by mouth every 6 (six) hours as needed for anxiety.   amLODipine (NORVASC) 5 MG tablet TAKE 1 TABLET EVERY DAY   ascorbic acid (VITAMIN C) 500 MG tablet Take 500 mg by mouth daily.   aspirin 81 MG tablet Take 1 tablet by mouth daily.   Blood Glucose Monitoring Suppl (ACCU-CHEK AVIVA PLUS) w/Device KIT USE TO CHECK BLOOD SUGAR ONCE A DAY   Blood Pressure Monitoring (BLOOD PRESSURE MONITOR/ARM) DEVI Use to check blood pressure of upper arm daily as needed for hypertension (I10)   DULoxetine (CYMBALTA) 30 MG capsule TAKE 1 CAPSULE BY MOUTH EVERY DAY   ezetimibe (ZETIA)  10 MG tablet TAKE 1 TABLET EVERY DAY   fluticasone (FLONASE) 50 MCG/ACT nasal spray PLACE 2 SPRAYS INTO BOTH NOSTRILS DAILY.   furosemide (LASIX) 20 MG tablet TAKE 1 TABLET EVERY DAY   gabapentin (NEURONTIN) 100 MG capsule TAKE 1 CAPSULE EVERY MORNING   gabapentin (NEURONTIN) 300 MG capsule Take 1 capsule (300 mg total) by mouth at bedtime. In addition to the 182m capsule in the morning   glipiZIDE (GLUCOTROL XL) 10 MG 24 hr tablet TAKE 1 TABLET EVERY DAY   glucose blood (ACCU-CHEK AVIVA PLUS) test strip TEST BLOOD SUGAR ONE TIME DAILY   levothyroxine (SYNTHROID) 75 MCG tablet Take 1 tablet (75 mcg total) by mouth daily.   metFORMIN (GLUCOPHAGE-XR) 750 MG 24 hr tablet TAKE 1 TABLET TWICE DAILY   Omega-3 Fatty Acids (FISH OIL) 1000 MG CAPS Take 1 capsule by mouth daily. (Patient not taking: Reported on 11/05/2020)   oxybutynin (DITROPAN) 5 MG tablet TAKE 1 TABLET 2 TO 3 TIMES DAILY AS NEEDED FOR  URINARY FREQUENCY   quinapril (ACCUPRIL) 40 MG tablet TAKE 2 TABLETS EVERY DAY   No facility-administered medications prior to visit.    Review of Systems  {Labs  Heme  Chem  Endocrine  Serology  Results Review (optional):23779}   Objective    There were no vitals taken for this visit. {Show previous vital signs (optional):23777}   Physical Exam  ***  No results found for any visits on 03/07/21.  Assessment & Plan     ***  No follow-ups on file.      {provider attestation***:1}   DLelon Huh MD  [phone](phone) [phone](fax)  [facility]  

## 2021-03-07 NOTE — Chronic Care Management (AMB) (Signed)
  Chronic Care Management   Follow Up Note   03/07/2021 Name: [name] [name] MRN: [id number] DOB: 1933/09/09   Primary Care Provider: Birdie Sons, MD Reason for referral : Chronic Care Management   [name] [name] is currently enrolled in the Chronic Care Management program. Her daughter/caregiver [name] [name] anticipates being available to complete a telephonic outreach and provide updates within the next week. Agreed to call with urgent concerns if needed.   PLAN  Anticipate outreach within the next week.  [facility] Care Management [facility] [phone]

## 2021-03-10 ENCOUNTER — Ambulatory Visit (INDEPENDENT_AMBULATORY_CARE_PROVIDER_SITE_OTHER): Payer: Medicare HMO

## 2021-03-10 DIAGNOSIS — Z9119 Patient's noncompliance with other medical treatment and regimen: Secondary | ICD-10-CM | POA: Diagnosis not present

## 2021-03-10 DIAGNOSIS — R296 Repeated falls: Secondary | ICD-10-CM | POA: Diagnosis not present

## 2021-03-10 DIAGNOSIS — E1149 Type 2 diabetes mellitus with other diabetic neurological complication: Secondary | ICD-10-CM

## 2021-03-10 DIAGNOSIS — R2681 Unsteadiness on feet: Secondary | ICD-10-CM | POA: Diagnosis not present

## 2021-03-10 NOTE — Chronic Care Management (AMB) (Signed)
Chronic Care Management   Follow Up Note   03/10/2021 Name: [name] [name] MRN: [id number] DOB: 03-24-1933  Primary Care Provider: Birdie Sons, MD Reason for referral : Chronic Care Management   [name] Gravley is a 87 y.o. year old female who is a primary care patient of Birdie Sons, MD. She is currently enrolled in the Chronic Care Management program. A routine telephonic outreach was conducted today with her daughter/caregiver [name].   Review of Mrs. Gloster status, including review of consultants reports, relevant labs and test results was conducted today. Collaboration with appropriate care team members was performed as part of the comprehensive evaluation and provision of chronic care management services.         Outpatient Encounter Medications as of 03/10/2021  Medication Sig Note   Accu-Chek Softclix Lancets lancets USE TO CHECK BLOOD SUGAR ONCE A DAY    Alcohol Swabs (B-D SINGLE USE SWABS REGULAR) PADS USE TEST BLOOD SUGAR EVERY DAY    ALPRAZolam (XANAX) 0.25 MG tablet Take 1 tablet (0.25 mg total) by mouth every 6 (six) hours as needed for anxiety.    amLODipine (NORVASC) 5 MG tablet TAKE 1 TABLET EVERY DAY    ascorbic acid (VITAMIN C) 500 MG tablet Take 500 mg by mouth daily.    aspirin 81 MG tablet Take 1 tablet by mouth daily. 03/27/2015: Received from: [place]:    Blood Glucose Monitoring Suppl (ACCU-CHEK AVIVA PLUS) w/Device KIT USE TO CHECK BLOOD SUGAR ONCE A DAY    Blood Pressure Monitoring (BLOOD PRESSURE MONITOR/ARM) DEVI Use to check blood pressure of upper arm daily as needed for hypertension (I10)    DULoxetine (CYMBALTA) 30 MG capsule TAKE 1 CAPSULE BY MOUTH EVERY DAY    ezetimibe (ZETIA) 10 MG tablet TAKE 1 TABLET EVERY DAY    fluticasone (FLONASE) 50 MCG/ACT nasal spray PLACE 2 SPRAYS INTO BOTH NOSTRILS DAILY.    furosemide (LASIX) 20 MG tablet TAKE 1 TABLET EVERY DAY    gabapentin (NEURONTIN) 100 MG capsule  TAKE 1 CAPSULE EVERY MORNING    gabapentin (NEURONTIN) 300 MG capsule Take 1 capsule (300 mg total) by mouth at bedtime. In addition to the 181m capsule in the morning    glipiZIDE (GLUCOTROL XL) 10 MG 24 hr tablet TAKE 1 TABLET EVERY DAY    glucose blood (ACCU-CHEK AVIVA PLUS) test strip TEST BLOOD SUGAR ONE TIME DAILY    levothyroxine (SYNTHROID) 75 MCG tablet Take 1 tablet (75 mcg total) by mouth daily.    metFORMIN (GLUCOPHAGE-XR) 750 MG 24 hr tablet TAKE 1 TABLET TWICE DAILY    Omega-3 Fatty Acids (FISH OIL) 1000 MG CAPS Take 1 capsule by mouth daily. (Patient not taking: Reported on 11/05/2020) 03/27/2015: Received from: [facility] Take by mouth.   oxybutynin (DITROPAN) 5 MG tablet TAKE 1 TABLET 2 TO 3 TIMES DAILY AS NEEDED FOR  URINARY FREQUENCY    quinapril (ACCUPRIL) 40 MG tablet TAKE 2 TABLETS EVERY DAY    No facility-administered encounter medications on file as of 03/10/2021.     Objective:  Patient Care Plan: Fall Risk (Adult)     Problem Identified: Fall Risk      Long-Range Goal: Absence of Fall and Fall-Related Injury   Start Date: 11/25/2020  Expected End Date: 03/14/2021  Priority: High  Note:   Current Barriers:  High Risk for Falls r/t unsteady gait r/t chronic joint and back pain  Clinical Goal(s):  Over the  next 120 days, patient will not experience falls or require emergent care d/t fall related injuries  Interventions:  Collaboration with Caryn Section Kirstie Peri, MD regarding development and update of comprehensive plan of care as evidenced by provider attestation and co-signature Inter-disciplinary care team collaboration (see longitudinal plan of care) Reviewed safety and fall prevention. [name] reports Mrs. Wiatrek experienced two falls that she is aware of within the last month. Did not witness the falls and was informed by her father. Reports Mrs. Elms did not sustain injuries and did not require emergency evaluation. Has not  noticed scratches, bruises or minor abrasions. Reports she has a cane, walker and wheelchair available to use but unable to verify if she uses the devices when Mrs. Shadoan is not being supervised. Trip hazards within the home have been removed. Advised to ensure she wears fitted skid free footwear and avoids prolonged activity to prevent accidental falls. Feels she will benefit from Physical Therapy. Will follow-up to confirm referrals have been placed. Discussed ability to perform ADL's and tasks in the home. Reports she can perform self-care with minimal assistance. Discussed in-home assistance options and Medicaid eligibility. Will consider long term in-home needs if she continues to decline. [name] remains in the home and is available to assist but agrees that Adult Day Program options may be required if Mrs. Bellemare declines and is unable to be left alone. She is concerned that Mrs. Lafon experiences episodes of combativeness but will consider options if her functional status declines.  Agreed to provide update with changes in her care management needs.   Self-Care Deficits/Patient Goals:  -Ensure patient utilizes assistive device appropriately with all ambulation -Ensure pathways are clear and well lit -Ensure patient wears secure fitting, skid free footwear at all times when ambulating -Consider options for in-home assistance and Adult Day Programs -Notify provider or care management team if functional status changes or declines   Follow Up Plan:  Will follow up within the next month    Patient Care Plan: Diabetes Type 2 (Adult)     Problem Identified: Glycemic Management (Diabetes, Type 2)      Long-Range Goal: Glycemic Management Optimized   Start Date: 11/25/2020  Expected End Date: 03/14/2021  Priority: High  Note:    Lab Results  Component Value Date   HGBA1C 6.6 (H) 11/05/2020    Current Barriers:  Chronic Disease Management support and educational needs r/t Diabetes  management  Case Manager Clinical Goal(s):  Over the next 120 days, patient will demonstrate improved adherence to prescribed treatment plan for Diabetes management as evidenced by taking medications as prescribed, monitoring and recording of CBG, and adherence to an ADA/ carb modified diet.  Interventions:  Collaboration with Birdie Sons, MD regarding development and update of comprehensive plan of care as evidenced by provider attestation and co-signature Inter-disciplinary care team collaboration (see longitudinal plan of care) Reviewed medications and compliance with treatment plan. [name] continues to prepares medications. Notes that Mrs. Anselmo refuses to monitor her blood sugar levels and tends to be combative when prompted. Thoroughly reviewed s/sx of hypoglycemia and hyperglycemia and encouraged to monitor. [name] continues to prepare daily meals and able to provide oversight with her nutritional intake. Attempting to adhere to recommended cardiac prudent/heart healthy meals. DM preventive care exams are up to date.  Reviewed s/sx of complications that require immediate medical attention.   Patient Goals/Self-Care Activities: Caregiver will: -Ensure patient takes medications as prescribed -Ensure patient attends all scheduled provider appointments -Encourage patient to monitor  blood glucose levels consistently and utilize recommended interventions -Adhere to prescribed ADA/carb modified diet when preparing meals -Notify provider or care management team with questions and new concerns as needed   Follow Up Plan:  Will follow up next month      PLAN A member of the care management team will  follow up within the next month.   [facility] Care Management [employer] [phone]

## 2021-03-14 ENCOUNTER — Telehealth: Payer: Medicare HMO

## 2021-03-21 NOTE — Patient Instructions (Signed)
Thank you for allowing the Chronic Care Management team to participate in your care.  It was a pleasure speaking with you. Please feel free to contact me with questions.  Goals Addressed: Patient Care Plan: Fall Risk (Adult)     Problem Identified: Fall Risk      Long-Range Goal: Absence of Fall and Fall-Related Injury   Start Date: 11/25/2020  Expected End Date: 03/14/2021  Priority: High  Note:   Current Barriers:  High Risk for Falls r/t unsteady gait r/t chronic joint and back pain  Clinical Goal(s):  Over the next 120 days, patient will not experience falls or require emergent care d/t fall related injuries  Interventions:  Collaboration with Birdie Sons, MD regarding development and update of comprehensive plan of care as evidenced by provider attestation and co-signature Inter-disciplinary care team collaboration (see longitudinal plan of care) Reviewed safety and fall prevention. [name] reports Mrs. Darrington experienced two falls that she is aware of within the last month. Did not witness the falls and was informed by her father. Reports Mrs. Cabezas did not sustain injuries and did not require emergency evaluation. Has not noticed scratches, bruises or minor abrasions. Reports she has a cane, walker and wheelchair available to use but unable to verify if she uses the devices when Mrs. Rozeboom is not being supervised. Trip hazards within the home have been removed. Advised to ensure she wears fitted skid free footwear and avoids prolonged activity to prevent accidental falls. Feels she will benefit from Physical Therapy. Will follow-up to confirm referrals have been placed. Discussed ability to perform ADL's and tasks in the home. Reports she can perform self-care with minimal assistance. Discussed in-home assistance options and Medicaid eligibility. Will consider long term in-home needs if she continues to decline. [name] remains in the home and is available to assist but agrees  that Adult Day Program options may be required if Mrs. Criado declines and is unable to be left alone. She is concerned that Mrs. Festa experiences episodes of combativeness but will consider options if her functional status declines.  Agreed to provide update with changes in her care management needs.   Self-Care Deficits/Patient Goals:  -Ensure patient utilizes assistive device appropriately with all ambulation -Ensure pathways are clear and well lit -Ensure patient wears secure fitting, skid free footwear at all times when ambulating -Consider options for in-home assistance and Adult Day Programs -Notify provider or care management team if functional status changes or declines   Follow Up Plan:  Will follow up within the next month    Patient Care Plan: Diabetes Type 2 (Adult)     Problem Identified: Glycemic Management (Diabetes, Type 2)      Long-Range Goal: Glycemic Management Optimized   Start Date: 11/25/2020  Expected End Date: 03/14/2021  Priority: High  Note:    Lab Results  Component Value Date   HGBA1C 6.6 (H) 11/05/2020    Current Barriers:  Chronic Disease Management support and educational needs r/t Diabetes management  Case Manager Clinical Goal(s):  Over the next 120 days, patient will demonstrate improved adherence to prescribed treatment plan for Diabetes management as evidenced by taking medications as prescribed, monitoring and recording of CBG, and adherence to an ADA/ carb modified diet.  Interventions:  Collaboration with Birdie Sons, MD regarding development and update of comprehensive plan of care as evidenced by provider attestation and co-signature Inter-disciplinary care team collaboration (see longitudinal plan of care) Reviewed medications and compliance with treatment plan. [name]  continues to prepares medications. Notes that Mrs. Niedermeier refuses to monitor her blood sugar levels and tends to be combative when prompted. Thoroughly reviewed  s/sx of hypoglycemia and hyperglycemia and encouraged to monitor. [name] continues to prepare daily meals and able to provide oversight with her nutritional intake. Attempting to adhere to recommended cardiac prudent/heart healthy meals. DM preventive care exams are up to date.  Reviewed s/sx of complications that require immediate medical attention.   Patient Goals/Self-Care Activities: Caregiver will: -Ensure patient takes medications as prescribed -Ensure patient attends all scheduled provider appointments -Encourage patient to monitor blood glucose levels consistently and utilize recommended interventions -Adhere to prescribed ADA/carb modified diet when preparing meals -Notify provider or care management team with questions and new concerns as needed   Follow Up Plan:  Will follow up next month     Mrs. Shadden caregiver/daughter [name] verbalized understanding of the information discussed during the telephonic outreach today. Declined need for mailed/printed instructions. A member of the care management team will follow up within the next month.    [facility] Care Management [facility] [phone]

## 2021-03-24 ENCOUNTER — Other Ambulatory Visit: Payer: Self-pay | Admitting: Family Medicine

## 2021-03-24 DIAGNOSIS — E1149 Type 2 diabetes mellitus with other diabetic neurological complication: Secondary | ICD-10-CM

## 2021-03-24 NOTE — Telephone Encounter (Signed)
Future visit in 3 weeks °

## 2021-04-07 ENCOUNTER — Other Ambulatory Visit: Payer: Self-pay | Admitting: Family Medicine

## 2021-04-07 ENCOUNTER — Telehealth: Payer: Self-pay

## 2021-04-07 MED ORDER — METFORMIN HCL ER 750 MG PO TB24: 750.0000 mg | ORAL_TABLET | Freq: Two times a day (BID) | ORAL | 0 refills | Status: DC

## 2021-04-07 MED ORDER — FUROSEMIDE 20 MG PO TABS: 20.0000 mg | ORAL_TABLET | Freq: Every day | ORAL | 0 refills | Status: DC

## 2021-04-07 MED ORDER — QUINAPRIL HCL 40 MG PO TABS: 80.0000 mg | ORAL_TABLET | Freq: Every day | ORAL | 0 refills | Status: DC

## 2021-04-07 NOTE — Telephone Encounter (Signed)
Copied from [place] [phone]. Topic: General - Other >> Apr 07, 2021  3:27 PM Loma [place] wrote: furosemide (LASIX) 20 MG tablet 90 tablet 0 04/07/2021   Sig: TAKE 1 TABLET EVERY DAY  Sent to pharmacy as: furosemide (LASIX) 20 MG tablet metFORMIN (GLUCOPHAGE-XR) 750 MG 24 hr tablet 180 tablet 0 04/07/2021   Sig: TAKE 1 TABLET TWICE DAILY quinapril (ACCUPRIL) 40 MG tablet 180 tablet 4 04/07/2021   Sig: TAKE 2 TABLETS EVERY DAY [id number] [place]Phone: [phone]: [phone] Pt daughter had called [employer] for scripts and did not realize did not flow correctly to [address] FCaryn Sectionkindly redid all but due to delay with [facility] pt is out of these 3 meds above.  May pt have these three for about 10 days till current order arrives. 10 pills for Lasix and possible 20 each for 10 day supply on the other 2?  [facility].

## 2021-04-09 ENCOUNTER — Other Ambulatory Visit: Payer: Self-pay | Admitting: Family Medicine

## 2021-04-09 DIAGNOSIS — R413 Other amnesia: Secondary | ICD-10-CM

## 2021-04-16 ENCOUNTER — Ambulatory Visit (INDEPENDENT_AMBULATORY_CARE_PROVIDER_SITE_OTHER): Payer: Medicare HMO

## 2021-04-16 ENCOUNTER — Ambulatory Visit: Payer: Medicare HMO | Admitting: Family Medicine

## 2021-04-16 ENCOUNTER — Telehealth: Payer: Self-pay

## 2021-04-16 DIAGNOSIS — E1149 Type 2 diabetes mellitus with other diabetic neurological complication: Secondary | ICD-10-CM

## 2021-04-16 DIAGNOSIS — R296 Repeated falls: Secondary | ICD-10-CM

## 2021-04-16 NOTE — Chronic Care Management (AMB) (Signed)
Chronic Care Management   CCM RN Visit Note  04/16/2021 Name: [place] M Vandruff MRN: [id number] DOB: 01-21-1933  Subjective: [place] M Carrera is a 87 y.o. year old female who is a primary care patient of Caryn Section, Kirstie Peri, MD. The care management team was consulted for assistance with disease management and care coordination needs.    Engaged with patient's daughter/caregiver [name] by telephone for follow up visit in response to provider referral for case management and care coordination services.   Consent to Services:  The patient was given information about Chronic Care Management services, agreed to services, and gave verbal consent prior to initiation of services.  Please see initial visit note for detailed documentation.    Assessment: Review of patient past medical history, allergies, medications, health status, including review of consultants reports, laboratory and other test data, was performed as part of comprehensive evaluation and provision of chronic care management services.   SDOH (Social Determinants of Health) assessments and interventions performed:  No  CCM Care Plan  Allergies  Allergen Reactions   Aricept [Donepezil]     Irritability    Lovastatin     Weakness   Pravastatin Sodium     Muscle weakness   Simvastatin     Fatigue    Outpatient Encounter Medications as of 04/16/2021  Medication Sig Note   Accu-Chek Softclix Lancets lancets USE TO CHECK BLOOD SUGAR ONCE A DAY    Alcohol Swabs (DROPSAFE ALCOHOL PREP) 70 % PADS USE TO TEST BLOOD SUGAR EVERY DAY    ALPRAZolam (XANAX) 0.25 MG tablet Take 1 tablet (0.25 mg total) by mouth every 6 (six) hours as needed for anxiety.    amLODipine (NORVASC) 5 MG tablet TAKE 1 TABLET EVERY DAY    ascorbic acid (VITAMIN C) 500 MG tablet Take 500 mg by mouth daily.    aspirin 81 MG tablet Take 1 tablet by mouth daily. 03/27/2015: Received from: [facility]:    Blood Glucose Monitoring Suppl  (ACCU-CHEK AVIVA PLUS) w/Device KIT USE TO CHECK BLOOD SUGAR ONCE A DAY    Blood Pressure Monitoring (BLOOD PRESSURE MONITOR/ARM) DEVI Use to check blood pressure of upper arm daily as needed for hypertension (I10)    DULoxetine (CYMBALTA) 30 MG capsule TAKE 1 CAPSULE BY MOUTH EVERY DAY    ezetimibe (ZETIA) 10 MG tablet TAKE 1 TABLET EVERY DAY    fluticasone (FLONASE) 50 MCG/ACT nasal spray PLACE 2 SPRAYS INTO BOTH NOSTRILS DAILY.    furosemide (LASIX) 20 MG tablet Take 1 tablet (20 mg total) by mouth daily.    gabapentin (NEURONTIN) 100 MG capsule TAKE 1 CAPSULE EVERY MORNING    gabapentin (NEURONTIN) 300 MG capsule Take 1 capsule (300 mg total) by mouth at bedtime. In addition to the 136m capsule in the morning    glipiZIDE (GLUCOTROL XL) 10 MG 24 hr tablet TAKE 1 TABLET EVERY DAY    glucose blood (ACCU-CHEK AVIVA PLUS) test strip TEST BLOOD SUGAR ONE TIME DAILY    levothyroxine (SYNTHROID) 75 MCG tablet Take 1 tablet (75 mcg total) by mouth daily.    metFORMIN (GLUCOPHAGE-XR) 750 MG 24 hr tablet Take 1 tablet (750 mg total) by mouth 2 (two) times daily.    Omega-3 Fatty Acids (FISH OIL) 1000 MG CAPS Take 1 capsule by mouth daily. (Patient not taking: Reported on 11/05/2020) 03/27/2015: Received from: [facility] Take by mouth.   oxybutynin (DITROPAN) 5 MG tablet TAKE 1 TABLET 2 TO 3 TIMES  DAILY AS NEEDED FOR  URINARY FREQUENCY    quinapril (ACCUPRIL) 40 MG tablet Take 2 tablets (80 mg total) by mouth daily.    No facility-administered encounter medications on file as of 04/16/2021.    Patient Active Problem List   Diagnosis Date Noted   Weakness of both lower extremities 06/21/2020   Hypertensive retinopathy of both eyes 12/10/2017   Polyuria 04/01/2015   Breast fibroadenoma 03/27/2015   Diabetes mellitus type 2 with neurological manifestations ([place]) 03/27/2015   Edema 03/27/2015   HLD (hyperlipidemia) 03/27/2015   Hypertension 03/27/2015   Hypothyroid  03/27/2015   Lactose intolerance 03/27/2015   Lumbar disc disease 03/27/2015   Nocturia 03/27/2015   Osteoarthritis 03/27/2015   Lumbar canal stenosis 03/27/2015   B12 deficiency 03/27/2015   Arthritis, degenerative 12/31/2014   Enthesopathy of hip 12/31/2014   Dizziness and giddiness 12/31/2014   Calcification of intervertebral cartilage or disc of lumbar region 12/31/2014   Benign breast neoplasm 12/31/2014   DDD (degenerative disc disease), lumbar 03/27/2014   Neuritis or radiculitis due to rupture of lumbar intervertebral disc 03/26/2014    Conditions to be addressed/monitored:DMII and Fall Risk Patient Care Plan: Fall Risk (Adult)     Problem Identified: Fall Risk      Long-Range Goal: Absence of Fall and Fall-Related Injury   Start Date: 04/16/2021  Expected End Date: 07/16/2021  Priority: High  Note:   Current Barriers:  High Risk for Falls r/t unsteady gait r/t chronic joint and back pain  Clinical Goal(s):  Over the next 120 days, patient will not experience falls or require emergent care d/t fall related injuries  Interventions:  Collaboration with Birdie Sons, MD regarding development and update of comprehensive plan of care as evidenced by provider attestation and co-signature Inter-disciplinary care team collaboration (see longitudinal plan of care) Reviewed safety and fall prevention. Discussed ability to perform ADL's and tasks in the home. Mrs. Wahlstrom continues to perform self-care with minimal assistance. Discussed in-home assistance options and Medicaid eligibility. Will consider long term in-home needs if she continues to decline. [name] reports a pending visit with [facility] staff regarding in-home Covid booster and resources. Discussed options for Adult Day Program if patient is willing to participate. Will follow up to determine eligible. Discussed need for Palliative Care support. Advised to continue utilizing safety measures in the home to  prevent accidental falls.   Self-Care Deficits/Patient Goals:  Ensure patient utilizes assistive device appropriately with all ambulation Ensure pathways are clear and well lit Ensure patient wears secure fitting, skid free footwear at all times when ambulating Consider options for in-home assistance and Adult Day Programs Notify provider or care management team if functional status changes or declines   Follow Up Plan:  Will follow up next month    Patient Care Plan: Diabetes Type 2 (Adult)     Problem Identified: Glycemic Management (Diabetes, Type 2)      Long-Range Goal: Glycemic Management Optimized   Start Date: 11/25/2020  Expected End Date: 03/14/2021  Priority: High  Note:    Lab Results  Component Value Date   HGBA1C 6.6 (H) 11/05/2020    Current Barriers:  Chronic Disease Management support and educational needs r/t Diabetes management  Case Manager Clinical Goal(s):  Over the next 120 days, patient will demonstrate improved adherence to prescribed treatment plan for Diabetes management as evidenced by taking medications as prescribed, monitoring and recording of CBG, and adherence to an ADA/ carb modified diet.  Interventions:  Collaboration  with Birdie Sons, MD regarding development and update of comprehensive plan of care as evidenced by provider attestation and co-signature Inter-disciplinary care team collaboration (see longitudinal plan of care) Reviewed medications and compliance with treatment plan. [name] continues to prepares medications. Reports Mrs. Harvell continue to refuse blood glucose monitoring. Reviewed s/sx of hypoglycemia and hyperglycemia. [name] will continue to monitor for symptoms. [name] continues to prepare daily meals and attempting to adhere to recommended diet. Reviewed s/sx of complications that require immediate medical attention.   Patient Goals/Self-Care Activities: Caregiver will: Ensure patient takes medications as  prescribed Ensure patient attends all scheduled provider appointments Encourage patient to monitor blood glucose levels consistently and utilize recommended interventions Adhere to prescribed ADA/carb modified diet when preparing meals Notify provider or care management team with questions and new concerns as needed   Follow Up Plan:  Will follow up next month     PLAN A member of the care management team will follow up within the next month.   Cristy [facility]lander [facility] Care Management [facility] [phone]

## 2021-04-16 NOTE — Telephone Encounter (Signed)
Copied from [place] [phone]. Topic: General - Other >> Apr 16, 2021 10:29 AM Valere Dross wrote: Reason for CRM: Pts daughter called in stating they missed the appt because she is having trouble getting her out the house, [name] asked if someone could give her a call. Please advise. >> Apr 16, 2021 10:54 AM Pawlus, Brayton Layman A wrote: Daughter called back to see if she could be worked in sometime this week. Please advise.

## 2021-04-16 NOTE — Progress Notes (Deleted)
Established patient visit   Patient: [name] [name]   DOB: [birth year]   87 y.o. Female  MRN: [id number] Visit Date: 04/16/2021  Today's healthcare provider: Lelon Huh, MD   No chief complaint on file.  Subjective    HPI  Diabetes Mellitus Type II, follow-up  Lab Results  Component Value Date   HGBA1C 6.6 (H) 11/05/2020   HGBA1C 6.9 (A) 06/21/2020   HGBA1C 7.3 (H) 03/07/2020   Last seen for diabetes 6 months ago.  Management since then includes continuing the same treatment. She reports {excellent/good/fair/poor:19665} compliance with treatment. She {is/is not:21021397} having side effects. {document side effects if present:1}  Home blood sugar records: {diabetes glucometry results:16657}  Episodes of hypoglycemia? {Yes/No:20286} {enter details if yes:1}   Current insulin regiment: {***Type 'None' if not taking insulin                                                otherwise enter complete                                                 details of insulin regiment:1} Most Recent Eye Exam: ***  --------------------------------------------------------------------------------------------------- Hypertension, follow-up  BP Readings from Last 3 Encounters:  11/05/20 124/74  06/21/20 (!) 146/74  03/13/20 (!) 148/62   Wt Readings from Last 3 Encounters:  11/05/20 177 lb (80.3 kg)  06/21/20 183 lb (83 kg)  03/13/20 188 lb (85.3 kg)     She was last seen for hypertension 6 months ago.  BP at that visit was 124/74. Management since that visit includes; Well controlled.  Continue current medications.   She reports {excellent/good/fair/poor:19665} compliance with treatment. She {is/is not:9024} having side effects. {document side effects if present:1} She {is/is not:9024} exercising. She {is/is not:9024} adherent to low salt diet.   Outside blood pressures are {enter patient reported home BP, or 'not being checked':1}.  She {does/does not:200015} smoke.  Use  of agents associated with hypertension: {bp agents assoc with hypertension:511}.   --------------------------------------------------------------------------------------------------- Hypothyroid, follow-up  Lab Results  Component Value Date   TSH 0.437 (L) 11/05/2020   TSH 1.230 03/07/2020   TSH 0.828 04/10/2019   Wt Readings from Last 3 Encounters:  11/05/20 177 lb (80.3 kg)  06/21/20 183 lb (83 kg)  03/13/20 188 lb (85.3 kg)    She was last seen for hypothyroid 6 months ago.  Management since that visit includes; labs checked showing-no changes to medication. She reports {excellent/good/fair/poor:19665} compliance with treatment. She {is/is not:21021397} having side effects. {document side effects if present:1}  Symptoms: {Yes/No:20286} change in energy level {Yes/No:20286} constipation  {Yes/No:20286} diarrhea {Yes/No:20286} heat / cold intolerance  {Yes/No:20286} nervousness {Yes/No:20286} palpitations  {Yes/No:20286} weight changes    -----------------------------------------------------------------------------------------  Follow up for B12 Deficiency:   The patient was last seen for this 6 months ago. Changes made at last visit include; labs checked showing-vitamin b12 is low. Advised she needs to start taking OTC vitamin B12 1000 mg once every day.  She reports {excellent/good/fair/poor:19665} compliance with treatment. She feels that condition is {improved/worse/unchanged:3041574}. She {is/is not:21021397} having side effects. ***  ----------------------------------------------------------------------------------------- Follow up for Frequent Falls:  The patient was last seen for this 6 months ago. Changes  made at last visit include; referred to neurology.   She reports {excellent/good/fair/poor:19665} compliance with treatment. She feels that condition is {improved/worse/unchanged:3041574}. She {is/is not:21021397} having side effects.  ***  -----------------------------------------------------------------------------------------      Medications: Outpatient Medications Prior to Visit  Medication Sig   Accu-Chek Softclix Lancets lancets USE TO CHECK BLOOD SUGAR ONCE A DAY   Alcohol Swabs (DROPSAFE ALCOHOL PREP) 70 % PADS USE TO TEST BLOOD SUGAR EVERY DAY   ALPRAZolam (XANAX) 0.25 MG tablet Take 1 tablet (0.25 mg total) by mouth every 6 (six) hours as needed for anxiety.   amLODipine (NORVASC) 5 MG tablet TAKE 1 TABLET EVERY DAY   ascorbic acid (VITAMIN C) 500 MG tablet Take 500 mg by mouth daily.   aspirin 81 MG tablet Take 1 tablet by mouth daily.   Blood Glucose Monitoring Suppl (ACCU-CHEK AVIVA PLUS) w/Device KIT USE TO CHECK BLOOD SUGAR ONCE A DAY   Blood Pressure Monitoring (BLOOD PRESSURE MONITOR/ARM) DEVI Use to check blood pressure of upper arm daily as needed for hypertension (I10)   DULoxetine (CYMBALTA) 30 MG capsule TAKE 1 CAPSULE BY MOUTH EVERY DAY   ezetimibe (ZETIA) 10 MG tablet TAKE 1 TABLET EVERY DAY   fluticasone (FLONASE) 50 MCG/ACT nasal spray PLACE 2 SPRAYS INTO BOTH NOSTRILS DAILY.   furosemide (LASIX) 20 MG tablet Take 1 tablet (20 mg total) by mouth daily.   gabapentin (NEURONTIN) 100 MG capsule TAKE 1 CAPSULE EVERY MORNING   gabapentin (NEURONTIN) 300 MG capsule Take 1 capsule (300 mg total) by mouth at bedtime. In addition to the 100mg capsule in the morning   glipiZIDE (GLUCOTROL XL) 10 MG 24 hr tablet TAKE 1 TABLET EVERY DAY   glucose blood (ACCU-CHEK AVIVA PLUS) test strip TEST BLOOD SUGAR ONE TIME DAILY   levothyroxine (SYNTHROID) 75 MCG tablet Take 1 tablet (75 mcg total) by mouth daily.   metFORMIN (GLUCOPHAGE-XR) 750 MG 24 hr tablet Take 1 tablet (750 mg total) by mouth 2 (two) times daily.   Omega-3 Fatty Acids (FISH OIL) 1000 MG CAPS Take 1 capsule by mouth daily. (Patient not taking: Reported on 11/05/2020)   oxybutynin (DITROPAN) 5 MG tablet TAKE 1 TABLET 2 TO 3 TIMES DAILY AS NEEDED  FOR  URINARY FREQUENCY   quinapril (ACCUPRIL) 40 MG tablet Take 2 tablets (80 mg total) by mouth daily.   No facility-administered medications prior to visit.    Review of Systems  Constitutional:  Negative for appetite change, chills, fatigue and fever.  Respiratory:  Negative for chest tightness and shortness of breath.   Cardiovascular:  Negative for chest pain and palpitations.  Gastrointestinal:  Negative for abdominal pain, nausea and vomiting.  Neurological:  Negative for dizziness and weakness.      Objective    There were no vitals taken for this visit.    Physical Exam  ***  No results found for any visits on 04/16/21.  Assessment & Plan     ***  No follow-ups on file.      {provider attestation***:1}   Donald Fisher, MD  [facility] [phone] (phone) [phone] (fax)  [facility]  

## 2021-04-16 NOTE — Telephone Encounter (Signed)
[name] [name] is scheduled with Simona Huh 05/05/2021 at 10:20.  [name] advised.   Thanks,   -Mickel Baas

## 2021-04-21 NOTE — Patient Instructions (Addendum)
Thank you for allowing the Chronic Care Management team to participate in your care.    Patient Care Plan: Fall Risk (Adult)     Problem Identified: Fall Risk      Long-Range Goal: Absence of Fall and Fall-Related Injury   Start Date: 04/16/2021  Expected End Date: 07/16/2021  Priority: High  Note:   Current Barriers:  High Risk for Falls r/t unsteady gait r/t chronic joint and back pain  Clinical Goal(s):  Over the next 120 days, patient will not experience falls or require emergent care d/t fall related injuries  Interventions:  Collaboration with Birdie Sons, MD regarding development and update of comprehensive plan of care as evidenced by provider attestation and co-signature Inter-disciplinary care team collaboration (see longitudinal plan of care) Reviewed safety and fall prevention. Discussed ability to perform ADL's and tasks in the home. [name] [name] continues to perform self-care with minimal assistance. Discussed in-home assistance options and Medicaid eligibility. Will consider long term in-home needs if she continues to decline. [name] [name]. Discussed options for Adult Day Program if patient is willing to participate. Will follow up to determine eligible. Discussed need for Palliative Care support. Advised to continue utilizing safety measures in the home to prevent accidental falls.   Self-Care Deficits/Patient Goals:  Ensure patient utilizes assistive device appropriately with all ambulation Ensure pathways are clear and well lit Ensure patient wears secure fitting, skid free footwear at all times when ambulating Consider options for in-home assistance and Adult Day Programs Notify provider or care management team if functional status changes or declines   Follow Up Plan:  Will follow up next month    Patient Care Plan: Diabetes Type 2 (Adult)     Problem Identified:  Glycemic Management (Diabetes, Type 2)      Long-Range Goal: Glycemic Management Optimized   Start Date: 11/25/2020  Expected End Date: 03/14/2021  Priority: High  Note:    Lab Results  Component Value Date   HGBA1C 6.6 (H) 11/05/2020    Current Barriers:  Chronic Disease Management support and educational needs r/t Diabetes management  Case Manager Clinical Goal(s):  Over the next 120 days, patient will demonstrate improved adherence to prescribed treatment plan for Diabetes management as evidenced by taking medications as prescribed, monitoring and recording of CBG, and adherence to an ADA/ carb modified diet.  Interventions:  Collaboration with Birdie Sons, MD regarding development and update of comprehensive plan of care as evidenced by provider attestation and co-signature Inter-disciplinary care team collaboration (see longitudinal plan of care) Reviewed medications and compliance with treatment plan. [name] [name] continues to prepares medications. Reports [name] [name] continue to refuse blood glucose monitoring. Reviewed s/sx of hypoglycemia and hyperglycemia. [name] [name] will continue to monitor for symptoms. [name] [name] continues to prepare daily meals and attempting to adhere to recommended diet. Reviewed s/sx of complications that require immediate medical attention.   Patient Goals/Self-Care Activities: Caregiver will: Ensure patient takes medications as prescribed Ensure patient attends all scheduled provider appointments Encourage patient to monitor blood glucose levels consistently and utilize recommended interventions Adhere to prescribed ADA/carb modified diet when preparing meals Notify provider or care management team with questions and new concerns as needed   Follow Up Plan:  Will follow up next month      [name] [name] daughter [name] [name] verbalized understanding of the information discussed during the telephonic outreach. Declined need for mailed/printed instructions. A  member of the care  management team will follow up within the next month.   [name] [name] [facility] Care Management [facility] [phone]

## 2021-04-24 ENCOUNTER — Other Ambulatory Visit: Payer: Self-pay | Admitting: Family Medicine

## 2021-04-26 ENCOUNTER — Other Ambulatory Visit: Payer: Self-pay | Admitting: Family Medicine

## 2021-04-26 NOTE — Telephone Encounter (Signed)
Last OV that addresses issue: 07/08/20 Requested Prescriptions  Pending Prescriptions Disp Refills  . DULoxetine (CYMBALTA) 30 MG capsule [Pharmacy Med Name: DULOXETINE HCL DR 30 MG CAP] 90 capsule 0    Sig: TAKE 1 CAPSULE BY MOUTH EVERY DAY     Psychiatry: Antidepressants - SNRI Passed - 04/26/2021  8:59 AM      Passed - Last BP in normal range    BP Readings from Last 1 Encounters:  11/05/20 124/74         Passed - Valid encounter within last 6 months    Recent Outpatient Visits          5 months ago Primary hypertension   [facility] Birdie Sons, MD   10 months ago Diabetes mellitus type 2 with neurological manifestations [facility])   [facility] Birdie Sons, MD   1 year ago Weakness of both lower extremities   [facility] Birdie Sons, MD   1 year ago Diabetes mellitus type 2 with neurological manifestations [facility])   [facility] Birdie Sons, MD   2 years ago Diabetes mellitus type 2 with neurological manifestations [facility])   [facility] Birdie Sons, MD      Future Appointments            In 1 week Chrismon, Vickki Muff, PA-C [employer], [facility]

## 2021-05-05 ENCOUNTER — Other Ambulatory Visit: Payer: Self-pay

## 2021-05-05 ENCOUNTER — Ambulatory Visit (INDEPENDENT_AMBULATORY_CARE_PROVIDER_SITE_OTHER): Payer: Medicare HMO | Admitting: Family Medicine

## 2021-05-05 ENCOUNTER — Encounter: Payer: Self-pay | Admitting: Family Medicine

## 2021-05-05 VITALS — BP 134/57 | HR 72 | Wt 182.0 lb

## 2021-05-05 DIAGNOSIS — E1149 Type 2 diabetes mellitus with other diabetic neurological complication: Secondary | ICD-10-CM

## 2021-05-05 DIAGNOSIS — E538 Deficiency of other specified B group vitamins: Secondary | ICD-10-CM | POA: Diagnosis not present

## 2021-05-05 DIAGNOSIS — I10 Essential (primary) hypertension: Secondary | ICD-10-CM | POA: Diagnosis not present

## 2021-05-05 DIAGNOSIS — E785 Hyperlipidemia, unspecified: Secondary | ICD-10-CM | POA: Diagnosis not present

## 2021-05-05 LAB — POCT GLYCOSYLATED HEMOGLOBIN (HGB A1C): Hemoglobin A1C: 6.5 % — AB (ref 4.0–5.6)

## 2021-05-05 NOTE — Progress Notes (Signed)
Established patient visit   Patient: [name] [name]   DOB: 08/03/1933   87 y.o. Female  MRN: [id number] Visit Date: 05/05/2021  Today's healthcare provider: Vernie Murders, PA-C   Chief Complaint  Patient presents with   Hyperlipidemia   Diabetes   Hypertension    Subjective  -------------------------------------------------------------------------------------------------------------------- HPI  Diabetes Mellitus Type II, follow-up  Lab Results  Component Value Date   HGBA1C 6.6 (H) 11/05/2020   HGBA1C 6.9 (A) 06/21/2020   HGBA1C 7.3 (H) 03/07/2020   Last seen for diabetes 6 months ago.  Management since then includes continuing the same treatment. She reports excellent compliance with treatment. She is not having side effects.   Home blood sugar records:  120's  Episodes of hypoglycemia? No    Current insulin regiment: None Most Recent Eye Exam: UTD  --------------------------------------------------------------------------------------------------- Hypertension, follow-up  BP Readings from Last 3 Encounters:  05/05/21 (!) 134/57  11/05/20 124/74  06/21/20 (!) 146/74   Wt Readings from Last 3 Encounters:  05/05/21 182 lb (82.6 kg)  11/05/20 177 lb (80.3 kg)  06/21/20 183 lb (83 kg)     She was last seen for hypertension 6 months ago.  BP at that visit was 124/74. Management since that visit includes no changes. She reports excellent compliance with treatment. She is not having side effects.  She is not exercising. She is adherent to low salt diet.   Outside blood pressures are not being check.  She does not smoke.  Use of agents associated with hypertension: none.   --------------------------------------------------------------------------------------------------- Lipid/Cholesterol, follow-up  Last Lipid Panel: Lab Results  Component Value Date   CHOL 210 (H) 11/05/2020   LDLCALC 151 (H) 11/05/2020   HDL 41 11/05/2020   TRIG 98  11/05/2020    She was last seen for this 6 months ago.  Management since that visit includes no changes.  She reports excellent compliance with treatment. She is not having side effects.   Symptoms: No appetite changes No foot ulcerations  No chest pain No chest pressure/discomfort  No dyspnea No orthopnea  No fatigue Yes lower extremity edema  No palpitations No paroxysmal nocturnal dyspnea  No nausea No numbness or tingling of extremity  No polydipsia No polyuria  No speech difficulty No syncope     Last metabolic panel Lab Results  Component Value Date   GLUCOSE 76 11/05/2020   NA 139 11/05/2020   K 4.6 11/05/2020   BUN 9 11/05/2020   CREATININE 0.89 11/05/2020   GFRNONAA 59 (L) 11/05/2020   GFRAA 68 11/05/2020   CALCIUM 10.2 11/05/2020   AST 27 11/05/2020   ALT 21 11/05/2020   The ASCVD Risk score Mikey Bussing DC Jr., et al., 2013) failed to calculate for the following reasons:   The 2013 ASCVD risk score is only valid for ages 35 to 45  --------------------------------------------------------------------------------------------------- Past Medical History:  Diagnosis Date   Allergy    Arthritis    Cancer ([place])    thryoid   Cataract    Diabetes mellitus without complication ([place])    Hyperlipidemia    Hypertension    Thyroid disease    Past Surgical History:  Procedure Laterality Date   ABDOMINAL HYSTERECTOMY     BREAST BIOPSY Right ?   benign   CATARACT EXTRACTION     THYROIDECTOMY, PARTIAL  1994   TONSILLECTOMY     Family History  Problem Relation Age of Onset   Cancer Daughter  breast   Diabetes Son    Social History   Tobacco Use   Smoking status: Never   Smokeless tobacco: Never  Vaping Use   Vaping Use: Never used  Substance Use Topics   Alcohol use: No    Alcohol/week: 0.0 standard drinks   Drug use: No   Allergies  Allergen Reactions   Aricept [Donepezil]     Irritability    Lovastatin     Weakness   Pravastatin Sodium      Muscle weakness   Simvastatin     Fatigue   Medications: Outpatient Medications Prior to Visit  Medication Sig   Accu-Chek Softclix Lancets lancets USE TO CHECK BLOOD SUGAR ONCE A DAY   Alcohol Swabs (DROPSAFE ALCOHOL PREP) 70 % PADS USE TO TEST BLOOD SUGAR EVERY DAY   ALPRAZolam (XANAX) 0.25 MG tablet Take 1 tablet (0.25 mg total) by mouth every 6 (six) hours as needed for anxiety.   amLODipine (NORVASC) 5 MG tablet TAKE 1 TABLET EVERY DAY   ascorbic acid (VITAMIN C) 500 MG tablet Take 500 mg by mouth daily.   aspirin 81 MG tablet Take 1 tablet by mouth daily.   Blood Glucose Monitoring Suppl (ACCU-CHEK AVIVA PLUS) w/Device KIT USE TO CHECK BLOOD SUGAR ONCE A DAY   DULoxetine (CYMBALTA) 30 MG capsule TAKE 1 CAPSULE BY MOUTH EVERY DAY   ezetimibe (ZETIA) 10 MG tablet TAKE 1 TABLET (10 MG TOTAL) BY MOUTH DAILY.   fluticasone (FLONASE) 50 MCG/ACT nasal spray PLACE 2 SPRAYS INTO BOTH NOSTRILS DAILY.   furosemide (LASIX) 20 MG tablet Take 1 tablet (20 mg total) by mouth daily.   gabapentin (NEURONTIN) 100 MG capsule TAKE 1 CAPSULE EVERY MORNING   gabapentin (NEURONTIN) 300 MG capsule Take 1 capsule (300 mg total) by mouth at bedtime. In addition to the 191m capsule in the morning   glipiZIDE (GLUCOTROL XL) 10 MG 24 hr tablet TAKE 1 TABLET EVERY DAY   glucose blood (ACCU-CHEK AVIVA PLUS) test strip TEST BLOOD SUGAR ONE TIME DAILY   levothyroxine (SYNTHROID) 75 MCG tablet Take 1 tablet (75 mcg total) by mouth daily.   metFORMIN (GLUCOPHAGE-XR) 750 MG 24 hr tablet Take 1 tablet (750 mg total) by mouth 2 (two) times daily.   oxybutynin (DITROPAN) 5 MG tablet TAKE 1 TABLET 2 TO 3 TIMES DAILY AS NEEDED FOR  URINARY FREQUENCY   quinapril (ACCUPRIL) 40 MG tablet Take 2 tablets (80 mg total) by mouth daily.   Blood Pressure Monitoring (BLOOD PRESSURE MONITOR/ARM) DEVI Use to check blood pressure of upper arm daily as needed for hypertension (I10)   Omega-3 Fatty Acids (FISH OIL) 1000 MG CAPS Take 1  capsule by mouth daily. (Patient not taking: Reported on 11/05/2020)   No facility-administered medications prior to visit.    Review of Systems  Constitutional: Negative.   Respiratory: Negative.    Cardiovascular:  Positive for leg swelling. Negative for chest pain and palpitations.  Gastrointestinal: Negative.   Endocrine: Negative.   Musculoskeletal:  Positive for myalgias (Bilateral leg pain).  Skin:  Negative for wound.  Neurological:  Negative for dizziness, syncope, speech difficulty, light-headedness, numbness and headaches.      Objective  -------------------------------------------------------------------------------------------------------------------- BP (!) 134/57 (BP Location: Right Arm, Patient Position: Sitting, Cuff Size: Large)   Pulse 72   Wt 182 lb (82.6 kg)   SpO2 98%   BMI 31.24 kg/m   Physical Exam Constitutional:      General: She is not in acute distress.  Appearance: She is well-developed.  HENT:     Head: Normocephalic and atraumatic.     Right Ear: Hearing and tympanic membrane normal.     Left Ear: Hearing and tympanic membrane normal.     Nose: Nose normal.  Eyes:     General: Lids are normal. No scleral icterus.       Right eye: No discharge.        Left eye: No discharge.     Conjunctiva/sclera: Conjunctivae normal.  Cardiovascular:     Rate and Rhythm: Normal rate and regular rhythm.     Pulses: Normal pulses.     Heart sounds: Normal heart sounds.  Pulmonary:     Effort: Pulmonary effort is normal. No respiratory distress.     Breath sounds: Normal breath sounds.  Abdominal:     General: Bowel sounds are normal.     Palpations: Abdomen is soft.  Musculoskeletal:        General: Normal range of motion.     Cervical back: Neck supple.  Skin:    Findings: No lesion or rash.  Neurological:     Mental Status: She is alert and oriented to person, place, and time.  Psychiatric:        Speech: Speech normal.        Behavior:  Behavior normal.        Thought Content: Thought content normal.     No results found for any visits on 05/05/21.  Assessment & Plan  ---------------------------------------------------------------------------------------------------------------------- 1. Primary hypertension Good BP control with use of Amlodipine 2.5 mg qd, Lasix 20 mg qd and Quinapril 40 mg qd. Recheck labs. - CBC with Differential/Platelet - Comprehensive metabolic panel - Lipid panel  2. Diabetes mellitus type 2 with neurological manifestations (HCC) Hgb A1C 6.5% today. Using Glipizide 10 mg qd, Metformin 750 mg BID with Quinapril 40 mg 2 tablets qd and Gabapentin 300 mg hs & 100 mg q am. No open wounds. Will recheck follow up labs. Recheck pending reports. - POCT glycosylated hemoglobin (Hb A1C) - CBC with Differential/Platelet - Comprehensive metabolic panel - Lipid panel  3. Hyperlipidemia, unspecified hyperlipidemia type Presently on Zetia 10 mg qd with Omega-3 1000 mg qd. Recheck blood levels. - CBC with Differential/Platelet - Comprehensive metabolic panel - Lipid panel  4. B12 deficiency B12 was only 183 on 11-05-20 and advised ot take B12 1000 mg qd. Recheck levels to assess progress. - CBC with Differential/Platelet - Vitamin B12   No follow-ups on file.      I, Letia Guidry, PA-C, have reviewed all documentation for this visit. The documentation on 05/05/21 for the exam, diagnosis, procedures, and orders are all accurate and complete.    Vernie Murders, PA-C  [employer] [phone] (phone) [phone] (fax)  [facility]

## 2021-05-07 ENCOUNTER — Other Ambulatory Visit: Payer: Self-pay | Admitting: Family Medicine

## 2021-05-07 NOTE — Telephone Encounter (Signed)
Requested Prescriptions  Pending Prescriptions Disp Refills  . furosemide (LASIX) 20 MG tablet [Pharmacy Med Name: FUROSEMIDE 20 MG TABLET] 30 tablet 5    Sig: TAKE 1 TABLET BY MOUTH EVERY DAY     Cardiovascular:  Diuretics - Loop Passed - 05/07/2021  1:22 AM      Passed - K in normal range and within 360 days    Potassium  Date Value Ref Range Status  11/05/2020 4.6 3.5 - 5.2 mmol/L Final         Passed - Ca in normal range and within 360 days    Calcium  Date Value Ref Range Status  11/05/2020 10.2 8.7 - 10.3 mg/dL Final         Passed - Na in normal range and within 360 days    Sodium  Date Value Ref Range Status  11/05/2020 139 134 - 144 mmol/L Final         Passed - Cr in normal range and within 360 days    Creat  Date Value Ref Range Status  07/06/2017 0.90 (H) 0.60 - 0.88 mg/dL Final    Comment:    For patients >92 years of age, the reference limit for Creatinine is approximately 13% higher for people identified as African-American. .    Creatinine, Ser  Date Value Ref Range Status  11/05/2020 0.89 0.57 - 1.00 mg/dL Final    Comment:                   **Effective November 11, 2020 [facility] will begin**                  reporting the 2021 CKD-EPI creatinine equation that                  estimates kidney function without a race variable.    Creatinine, POC  Date Value Ref Range Status  02/26/2017 n/a mg/dL Final         Passed - Last BP in normal range    BP Readings from Last 1 Encounters:  05/05/21 (!) 134/57         Passed - Valid encounter within last 6 months    Recent Outpatient Visits          2 days ago Primary hypertension   [facility], Vickki Muff, PA-C   6 months ago Primary hypertension   [facility], MD   10 months ago Diabetes mellitus type 2 with neurological manifestations [facility])   [facility] Birdie Sons, MD   1 year ago Weakness of both lower extremities    [facility] Birdie Sons, MD   1 year ago Diabetes mellitus type 2 with neurological manifestations [facility])   [facility] Birdie Sons, MD             . quinapril (ACCUPRIL) 40 MG tablet [Pharmacy Med Name: QUINAPRIL 40 MG TABLET] 60 tablet 5    Sig: TAKE 2 TABLETS BY MOUTH EVERY DAY     Cardiovascular:  ACE Inhibitors Failed - 05/07/2021  1:22 AM      Failed - Cr in normal range and within 180 days    Creat  Date Value Ref Range Status  07/06/2017 0.90 (H) 0.60 - 0.88 mg/dL Final    Comment:    For patients >65 years of age, the reference limit for Creatinine is approximately 13% higher for people identified as African-American. Marland Kitchen  Creatinine, Ser  Date Value Ref Range Status  11/05/2020 0.89 0.57 - 1.00 mg/dL Final    Comment:                   **Effective November 11, 2020 [facility] will begin**                  reporting the 2021 CKD-EPI creatinine equation that                  estimates kidney function without a race variable.    Creatinine, POC  Date Value Ref Range Status  02/26/2017 n/a mg/dL Final         Failed - K in normal range and within 180 days    Potassium  Date Value Ref Range Status  11/05/2020 4.6 3.5 - 5.2 mmol/L Final         Passed - Patient is not pregnant      Passed - Last BP in normal range    BP Readings from Last 1 Encounters:  05/05/21 (!) 134/57         Passed - Valid encounter within last 6 months    Recent Outpatient Visits          2 days ago Primary hypertension   [facility], Vickki Muff, PA-C   6 months ago Primary hypertension   [facility] Birdie Sons, MD   10 months ago Diabetes mellitus type 2 with neurological manifestations [facility])   [facility] Birdie Sons, MD   1 year ago Weakness of both lower extremities   [facility] Birdie Sons, MD   1 year ago Diabetes mellitus type 2 with neurological  manifestations [facility])   [facility] Birdie Sons, MD             . metFORMIN (GLUCOPHAGE-XR) 750 MG 24 hr tablet [Pharmacy Med Name: METFORMIN HCL ER 750 MG TABLET] 60 tablet 5    Sig: TAKE 1 TABLET BY MOUTH 2 TIMES DAILY.     Endocrinology:  Diabetes - Biguanides Passed - 05/07/2021  1:22 AM      Passed - Cr in normal range and within 360 days    Creat  Date Value Ref Range Status  07/06/2017 0.90 (H) 0.60 - 0.88 mg/dL Final    Comment:    For patients >6 years of age, the reference limit for Creatinine is approximately 13% higher for people identified as African-American. .    Creatinine, Ser  Date Value Ref Range Status  11/05/2020 0.89 0.57 - 1.00 mg/dL Final    Comment:                   **Effective November 11, 2020 [facility] will begin**                  reporting the 2021 CKD-EPI creatinine equation that                  estimates kidney function without a race variable.    Creatinine, POC  Date Value Ref Range Status  02/26/2017 n/a mg/dL Final         Passed - HBA1C is between 0 and 7.9 and within 180 days    Hemoglobin A1C  Date Value Ref Range Status  05/05/2021 6.5 (A) 4.0 - 5.6 % Final   Hgb A1c MFr Bld  Date Value Ref Range Status  11/05/2020 6.6 (H) 4.8 -  5.6 % Final    Comment:             Prediabetes: 5.7 - 6.4          Diabetes: >6.4          Glycemic control for adults with diabetes: <7.0          Passed - AA eGFR in normal range and within 360 days    GFR, Est African American  Date Value Ref Range Status  07/06/2017 69 > OR = 60 mL/min/1.28m Final   GFR calc Af Amer  Date Value Ref Range Status  11/05/2020 68 >59 mL/min/1.73 Final    Comment:    **In accordance with recommendations from the NKF-ASN Task force,**   [facility] is in the process of updating its eGFR calculation to the   2021 CKD-EPI creatinine equation that estimates kidney function   without a race variable.    GFR, Est Non African American  Date  Value Ref Range Status  07/06/2017 60 > OR = 60 mL/min/1.728mFinal   GFR calc non Af Amer  Date Value Ref Range Status  11/05/2020 59 (L) >59 mL/min/1.73 Final         Passed - Valid encounter within last 6 months    Recent Outpatient Visits          2 days ago [facility]   6 months [facility]   [facility]   1 year ago Weakness of both lower extremities   [facility]   1 year [facility]  [facility]

## 2021-05-13 ENCOUNTER — Ambulatory Visit: Payer: Self-pay | Admitting: *Deleted

## 2021-05-13 NOTE — Telephone Encounter (Signed)
Patients daughter called for patient says having muscle spasms and hard for her to get in a car,wants to know if she can be sent something to take for it. Please call back   Called patient 's daughter to review symptoms for patient. Daughter reports patient c/o bilateral leg pain, muscle spasms since this am. Cramps "moving from one leg to the other ". Daughter gave patient gabapentin 300 mg and no relief of leg cramps or pain. Daughter also gave patient acetaminophen 650 mg with no relief. Patient can walk to bathroom but having more difficulty walking due to pain. Requesting medication to treat leg cramps. Earliest virtual appt 05/23/21 scheduled. Daughter reports patient probably has not been drinking fluids as she should. Encouraged to increase fluid intake and monitor amount of fluids due to being on lasix. Daughter reports she was supposed to bring patient in for lab work from last visit and has not been able to and would like to try to bring patient in as soon as she can to check lab work still not completed after seeing D. Chrismon PA. Care advise given. Patient's daughter verbalized understanding of care advise and to call back or go to [facility] or ED if symptoms worsen.  Reason for Disposition  [1] SEVERE pain (e.g., excruciating, unable to do any normal activities) AND [2] not improved after 2 hours of pain medicine  Answer Assessment - Initial Assessment Questions 1. ONSET: "When did the pain start?"      Today  2. LOCATION: "Where is the pain located?"      Bilateral legs  3. PAIN: "How bad is the pain?"    (Scale 1-10; or mild, moderate, severe)   -  MILD (1-3): doesn't interfere with normal activities    -  MODERATE (4-7): interferes with normal activities (e.g., work or school) or awakens from sleep, limping    -  SEVERE (8-10): excruciating pain, unable to do any normal activities, unable to walk     Moderate  4. WORK OR EXERCISE: "Has there been any recent work or exercise that involved  this part of the body?"      No  5. CAUSE: "What do you think is causing the leg pain?"     Not sure  6. OTHER SYMPTOMS: "Do you have any other symptoms?" (e.g., chest pain, back pain, breathing difficulty, swelling, rash, fever, numbness, weakness)    Continues with N/T in feet and legs  7. PREGNANCY: "Is there any chance you are pregnant?" "When was your last menstrual period?"     na  Protocols used: Leg Pain-A-AH

## 2021-05-14 DIAGNOSIS — E1149 Type 2 diabetes mellitus with other diabetic neurological complication: Secondary | ICD-10-CM

## 2021-05-14 MED ORDER — TIZANIDINE HCL 2 MG PO CAPS: 2.0000 mg | ORAL_CAPSULE | Freq: Every evening | ORAL | 1 refills | Status: DC | PRN

## 2021-05-14 NOTE — Telephone Encounter (Signed)
Can try tizanidine for muscle cramps, have sent prescription to her pharmacy.

## 2021-05-14 NOTE — Telephone Encounter (Signed)
Patient's daughter [name] advised.

## 2021-05-23 ENCOUNTER — Encounter: Payer: Self-pay | Admitting: Family Medicine

## 2021-05-23 ENCOUNTER — Telehealth (INDEPENDENT_AMBULATORY_CARE_PROVIDER_SITE_OTHER): Payer: Medicare HMO | Admitting: Family Medicine

## 2021-05-23 ENCOUNTER — Ambulatory Visit (INDEPENDENT_AMBULATORY_CARE_PROVIDER_SITE_OTHER): Payer: Medicare HMO

## 2021-05-23 DIAGNOSIS — R252 Cramp and spasm: Secondary | ICD-10-CM | POA: Diagnosis not present

## 2021-05-23 DIAGNOSIS — R296 Repeated falls: Secondary | ICD-10-CM

## 2021-05-23 DIAGNOSIS — I10 Essential (primary) hypertension: Secondary | ICD-10-CM

## 2021-05-23 DIAGNOSIS — R609 Edema, unspecified: Secondary | ICD-10-CM

## 2021-05-23 DIAGNOSIS — R413 Other amnesia: Secondary | ICD-10-CM

## 2021-05-23 MED ORDER — AMLODIPINE BESYLATE 2.5 MG PO TABS: 2.5000 mg | ORAL_TABLET | Freq: Every day | ORAL | 1 refills | Status: DC

## 2021-05-23 NOTE — Patient Instructions (Addendum)
Thank you for allowing the Chronic Care Management team to participate in your care.    Patient Care Plan: Fall Risk (Adult)     Problem Identified: Fall Risk      Long-Range Goal: Absence of Fall and Fall-Related Injury   Start Date: 04/16/2021  Expected End Date: 07/16/2021  Priority: High  Note:   Current Barriers:  High Risk for Falls r/t unsteady gait r/t chronic joint and back pain in patient with DM, Dementia and HTN.  Clinical Goal(s):  Over the next 120 days, patient will not experience falls or require emergent care d/t fall related injuries  Interventions:  Collaboration with Caryn Section Kirstie Peri, MD regarding development and update of comprehensive plan of care as evidenced by provider attestation and co-signature Inter-disciplinary care team collaboration (see longitudinal plan of care) Reviewed safety and fall prevention. Discussed ability to perform ADL's and tasks in the home with patient's daughter/caregiver [name]. Reports Mrs. Blaes has complained of muscle spasms but has not experienced a recent fall. She completed a virtual visit earlier today. Reports following recommended safety precautions.  Discussed long term plan. Reports she does not require a higher level of care/skilled facility however she would benefit from assistance in the home. Pending feedback from the Palliative Care staff regarding staffing/availability to conduct an initial assessment.   Self-Care Deficits/Patient Goals:  Ensure patient utilizes assistive device appropriately with all ambulation Ensure pathways are clear and well lit Ensure patient wears secure fitting, skid free footwear at all times when ambulating Consider options for in-home assistance and Adult Day Programs Notify provider or care management team if functional status changes or declines   Follow Up Plan:  Will follow up next month       Mrs. Hulbert daughter/caregiver verbalized understanding of the information discussed  during the telephonic outreach. Declined need for mailed/printed instructions. A member of the care management team will follow up within the next month.   [facility] Care Management [facility] [phone]

## 2021-05-23 NOTE — Progress Notes (Signed)
MyChart Video Visit    Virtual Visit via Video Note   This visit type was conducted due to national recommendations for restrictions regarding the COVID-19 Pandemic (e.g. social distancing) in an effort to limit this patient's exposure and mitigate transmission in our community. This patient is at least at moderate risk for complications without adequate follow up. This format is felt to be most appropriate for this patient at this time. Physical exam was limited by quality of the video and audio technology used for the visit.   Patient location: home Provider location: [facility]  I discussed the limitations of evaluation and management by telemedicine and the availability of in person appointments. The patient expressed understanding and agreed to proceed.  Patient: [name] [name]   DOB: May 19, 1933   87 y.o. Female  MRN: [id number] Visit Date: 05/23/2021  Today's healthcare provider: Lelon Huh, MD   Chief Complaint  Patient presents with   Spasms   Subjective    Muscle Pain This is a new problem. Episode onset: 2 weeks ago. The problem has been rapidly improving since onset. The pain is present in the left lower leg, right upper leg, right lower leg and left upper leg. Exacerbated by: walking. Pertinent negatives include no abdominal pain, chest pain, fatigue, fever, nausea, shortness of breath, vomiting or weakness.   On 05/14/2021 a prescription for Tizanidine was sent into the pharmacy which patient states has significntly improved her muscle cramps.    She has been having more swelling in her feet and legs over the summer, but is not sore or painful BP Readings from Last 3 Encounters:  05/05/21 (!) 134/57  11/05/20 124/74  06/21/20 (!) 146/74     Medications: Outpatient Medications Prior to Visit  Medication Sig   Accu-Chek Softclix Lancets lancets USE TO CHECK BLOOD SUGAR ONCE A DAY   Alcohol Swabs (DROPSAFE ALCOHOL PREP) 70 % PADS USE TO TEST BLOOD SUGAR EVERY DAY    ALPRAZolam (XANAX) 0.25 MG tablet Take 1 tablet (0.25 mg total) by mouth every 6 (six) hours as needed for anxiety.   amLODipine (NORVASC) 5 MG tablet TAKE 1 TABLET EVERY DAY   ascorbic acid (VITAMIN C) 500 MG tablet Take 500 mg by mouth daily.   aspirin 81 MG tablet Take 1 tablet by mouth daily.   Blood Glucose Monitoring Suppl (ACCU-CHEK AVIVA PLUS) w/Device KIT USE TO CHECK BLOOD SUGAR ONCE A DAY   DULoxetine (CYMBALTA) 30 MG capsule TAKE 1 CAPSULE BY MOUTH EVERY DAY   ezetimibe (ZETIA) 10 MG tablet TAKE 1 TABLET (10 MG TOTAL) BY MOUTH DAILY.   fluticasone (FLONASE) 50 MCG/ACT nasal spray PLACE 2 SPRAYS INTO BOTH NOSTRILS DAILY.   furosemide (LASIX) 20 MG tablet TAKE 1 TABLET BY MOUTH EVERY DAY   gabapentin (NEURONTIN) 100 MG capsule TAKE 1 CAPSULE EVERY MORNING   gabapentin (NEURONTIN) 300 MG capsule Take 1 capsule (300 mg total) by mouth at bedtime. In addition to the 127m capsule in the morning   glipiZIDE (GLUCOTROL XL) 10 MG 24 hr tablet TAKE 1 TABLET EVERY DAY   glucose blood (ACCU-CHEK AVIVA PLUS) test strip TEST BLOOD SUGAR ONE TIME DAILY   levothyroxine (SYNTHROID) 75 MCG tablet Take 1 tablet (75 mcg total) by mouth daily.   metFORMIN (GLUCOPHAGE-XR) 750 MG 24 hr tablet TAKE 1 TABLET BY MOUTH 2 TIMES DAILY.   oxybutynin (DITROPAN) 5 MG tablet TAKE 1 TABLET 2 TO 3 TIMES DAILY AS NEEDED FOR  URINARY FREQUENCY   quinapril (  ACCUPRIL) 40 MG tablet TAKE 2 TABLETS BY MOUTH EVERY DAY   tizanidine (ZANAFLEX) 2 MG capsule Take 1-2 capsules (2-4 mg total) by mouth at bedtime as needed for muscle spasms.   Blood Pressure Monitoring (BLOOD PRESSURE MONITOR/ARM) DEVI Use to check blood pressure of upper arm daily as needed for hypertension (I10)   Omega-3 Fatty Acids (FISH OIL) 1000 MG CAPS Take 1 capsule by mouth daily. (Patient not taking: Reported on 11/05/2020)   No facility-administered medications prior to visit.    Review of Systems  Constitutional:  Negative for appetite change,  chills, fatigue and fever.  Respiratory:  Negative for chest tightness and shortness of breath.   Cardiovascular:  Negative for chest pain and palpitations.  Gastrointestinal:  Negative for abdominal pain, nausea and vomiting.  Neurological:  Negative for dizziness and weakness.     Objective    There were no vitals taken for this visit.   Physical Exam   Awake, alert, oriented x 3. In no apparent distress. 2+ edema both feet and legs. No erythema.    Assessment & Plan     1. Leg cramps [name] [name] now resolved. May take tizanidine prn which seemed to be effective.   2. Primary hypertension Very well controlled. Will reduce from 81m to - amLODipine (NORVASC) 2.5 MG tablet; Take 1 tablet (2.5 mg total) by mouth daily.  Dispense: 90 tablet; Refill: 1 due to edema.   3. Edema, unspecified type Likely aggravated by amlodipine, dose reduced as above.   Reminded to get labs drawn that were ordered by DVernie Murdersin August.        I discussed the assessment and treatment plan with the patient. The patient was provided an opportunity to ask questions and all were answered. The patient agreed with the plan and demonstrated an understanding of the instructions.   The patient was advised to call back or seek an in-person evaluation if the symptoms worsen or if the condition fails to improve as anticipated.  I provided 7 minutes of non-face-to-face time during this encounter.  The entirety of the information documented in the History of Present Illness, Review of Systems and Physical Exam were personally obtained by me. Portions of this information were initially documented by the CMA and reviewed by me for thoroughness and accuracy.    DLelon Huh MD [phone](phone) [phone](fax)  [place]

## 2021-05-23 NOTE — Chronic Care Management (AMB) (Signed)
Chronic Care Management   CCM RN Visit Note  05/23/2021 Name: [name] [name] MRN: [id number] DOB: 1933-05-27  Subjective: [name] [name] is a 87 y.o. year old female who is a primary care patient of Caryn Section, Kirstie Peri, MD. The care management team was consulted for assistance with disease management and care coordination needs.    Engaged with patient's daughter/caregiver [name] [name] by telephone for follow up visit in response to provider referral for case management and care coordination services.   Consent to Services:  The patient was given information about Chronic Care Management services, agreed to services, and gave verbal consent prior to initiation of services.  Please see initial visit note for detailed documentation.   Assessment: Review of patient past medical history, allergies, medications, health status, including review of consultants reports, laboratory and other test data, was performed as part of comprehensive evaluation and provision of chronic care management services.   SDOH (Social Determinants of Health) assessments and interventions performed:  No  CCM Care Plan  Allergies  Allergen Reactions   Aricept [Donepezil]     Irritability    Lovastatin     Weakness   Pravastatin Sodium     Muscle weakness   Simvastatin     Fatigue    Outpatient Encounter Medications as of 05/23/2021  Medication Sig Note   Accu-Chek Softclix Lancets lancets USE TO CHECK BLOOD SUGAR ONCE A DAY    Alcohol Swabs (DROPSAFE ALCOHOL PREP) 70 % PADS USE TO TEST BLOOD SUGAR EVERY DAY    ALPRAZolam (XANAX) 0.25 MG tablet Take 1 tablet (0.25 mg total) by mouth every 6 (six) hours as needed for anxiety.    amLODipine (NORVASC) 2.5 MG tablet Take 1 tablet (2.5 mg total) by mouth daily.    ascorbic acid (VITAMIN C) 500 MG tablet Take 500 mg by mouth daily.    aspirin 81 MG tablet Take 1 tablet by mouth daily. 03/27/2015: Received from: [place]:    Blood Glucose  Monitoring Suppl (ACCU-CHEK AVIVA PLUS) w/Device KIT USE TO CHECK BLOOD SUGAR ONCE A DAY    Blood Pressure Monitoring (BLOOD PRESSURE MONITOR/ARM) DEVI Use to check blood pressure of upper arm daily as needed for hypertension (I10)    DULoxetine (CYMBALTA) 30 MG capsule TAKE 1 CAPSULE BY MOUTH EVERY DAY    ezetimibe (ZETIA) 10 MG tablet TAKE 1 TABLET (10 MG TOTAL) BY MOUTH DAILY.    fluticasone (FLONASE) 50 MCG/ACT nasal spray PLACE 2 SPRAYS INTO BOTH NOSTRILS DAILY.    furosemide (LASIX) 20 MG tablet TAKE 1 TABLET BY MOUTH EVERY DAY    gabapentin (NEURONTIN) 100 MG capsule TAKE 1 CAPSULE EVERY MORNING    gabapentin (NEURONTIN) 300 MG capsule Take 1 capsule (300 mg total) by mouth at bedtime. In addition to the 123m capsule in the morning    glipiZIDE (GLUCOTROL XL) 10 MG 24 hr tablet TAKE 1 TABLET EVERY DAY    glucose blood (ACCU-CHEK AVIVA PLUS) test strip TEST BLOOD SUGAR ONE TIME DAILY    levothyroxine (SYNTHROID) 75 MCG tablet Take 1 tablet (75 mcg total) by mouth daily.    metFORMIN (GLUCOPHAGE-XR) 750 MG 24 hr tablet TAKE 1 TABLET BY MOUTH 2 TIMES DAILY.    Omega-3 Fatty Acids (FISH OIL) 1000 MG CAPS Take 1 capsule by mouth daily. (Patient not taking: Reported on 11/05/2020) 03/27/2015: [place].   oxybutynin (DITROPAN) 5 MG tablet TAKE 1 TABLET 2 TO 3  TIMES DAILY AS NEEDED FOR  URINARY FREQUENCY    quinapril (ACCUPRIL) 40 MG tablet TAKE 2 TABLETS BY MOUTH EVERY DAY    tizanidine (ZANAFLEX) 2 MG capsule Take 1-2 capsules (2-4 mg total) by mouth at bedtime as needed for muscle spasms.    No facility-administered encounter medications on file as of 05/23/2021.    Patient Active Problem List   Diagnosis Date Noted   Weakness of both lower extremities 06/21/2020   Hypertensive retinopathy of both eyes 12/10/2017   Polyuria 04/01/2015   Breast fibroadenoma 03/27/2015   Diabetes mellitus type 2 with neurological manifestations  ([place]) 03/27/2015   Edema 03/27/2015   HLD (hyperlipidemia) 03/27/2015   Hypertension 03/27/2015   Hypothyroid 03/27/2015   Lactose intolerance 03/27/2015   Lumbar disc disease 03/27/2015   Nocturia 03/27/2015   Osteoarthritis 03/27/2015   Lumbar canal stenosis 03/27/2015   B12 deficiency 03/27/2015   Arthritis, degenerative 12/31/2014   Enthesopathy of hip 12/31/2014   Dizziness and giddiness 12/31/2014   Calcification of intervertebral cartilage or disc of lumbar region 12/31/2014   Benign breast neoplasm 12/31/2014   DDD (degenerative disc disease), lumbar 03/27/2014   Neuritis or radiculitis due to rupture of lumbar intervertebral disc 03/26/2014    Conditions to be addressed/monitored: High Fall Risk  Patient Care Plan: Fall Risk (Adult)     Problem Identified: Fall Risk      Long-Range Goal: Absence of Fall and Fall-Related Injury   Start Date: 04/16/2021  Expected End Date: 07/16/2021  Priority: High  Note:   Current Barriers:  High Risk for Falls r/t unsteady gait r/t chronic joint and back pain in patient with DM, Dementia and HTN.  Clinical Goal(s):  Over the next 120 days, patient will not experience falls or require emergent care d/t fall related injuries  Interventions:  Collaboration with Caryn Section Kirstie Peri, MD regarding development and update of comprehensive plan of care as evidenced by provider attestation and co-signature Inter-disciplinary care team collaboration (see longitudinal plan of care) Reviewed safety and fall prevention. Discussed ability to perform ADL's and tasks in the home with patient's daughter/caregiver [name] [name]. Reports [name] [name] has complained of muscle spasms but has not experienced a recent fall. She completed a virtual visit earlier today. Reports following recommended safety precautions.  Discussed long term plan. Reports she does not require a higher level of care/skilled facility however she would benefit from assistance in the home.  Pending feedback from the Palliative Care staff regarding staffing/availability to conduct an initial assessment.   Self-Care Deficits/Patient Goals:  Ensure patient utilizes assistive device appropriately with all ambulation Ensure pathways are clear and well lit Ensure patient wears secure fitting, skid free footwear at all times when ambulating Consider options for in-home assistance and Adult Day Programs Notify provider or care management team if functional status changes or declines   Follow Up Plan:  Will follow up next month      PLAN A member of the care management team will follow up within the next month.   Cristy Friedlander [facility]/THN Care Management [facility] [phone]

## 2021-06-05 ENCOUNTER — Ambulatory Visit: Payer: Self-pay

## 2021-06-05 DIAGNOSIS — I10 Essential (primary) hypertension: Secondary | ICD-10-CM

## 2021-06-05 DIAGNOSIS — R413 Other amnesia: Secondary | ICD-10-CM

## 2021-06-05 DIAGNOSIS — R296 Repeated falls: Secondary | ICD-10-CM

## 2021-06-05 NOTE — Chronic Care Management (AMB) (Signed)
Chronic Care Management   CCM RN Visit Note  06/05/2021 Name: [name] [name] MRN: [id number] DOB: Aug 22, 1933  Subjective: [name] [name] is a 87 y.o. year old female who is a primary care patient of Caryn Section, Kirstie Peri, MD. The care management team was consulted for assistance with disease management and care coordination needs.    Engaged with patient's daughter/caregiver [name] by telephone for follow up visit in response to provider referral for case management and care coordination services.   Consent to Services:  The patient was given information about Chronic Care Management services, agreed to services, and gave verbal consent prior to initiation of services.  Please see initial visit note for detailed documentation.   Assessment: Review of patient past medical history, allergies, medications, health status, including review of consultants reports, laboratory and other test data, was performed as part of comprehensive evaluation and provision of chronic care management services.   SDOH (Social Determinants of Health) assessments and interventions performed: No  CCM Care Plan  Allergies  Allergen Reactions   Aricept [Donepezil]     Irritability    Lovastatin     Weakness   Pravastatin Sodium     Muscle weakness   Simvastatin     Fatigue    Outpatient Encounter Medications as of 06/05/2021  Medication Sig Note   Accu-Chek Softclix Lancets lancets USE TO CHECK BLOOD SUGAR ONCE A DAY    Alcohol Swabs (DROPSAFE ALCOHOL PREP) 70 % PADS USE TO TEST BLOOD SUGAR EVERY DAY    ALPRAZolam (XANAX) 0.25 MG tablet Take 1 tablet (0.25 mg total) by mouth every 6 (six) hours as needed for anxiety.    amLODipine (NORVASC) 2.5 MG tablet Take 1 tablet (2.5 mg total) by mouth daily.    ascorbic acid (VITAMIN C) 500 MG tablet Take 500 mg by mouth daily.    aspirin 81 MG tablet Take 1 tablet by mouth daily. 03/27/2015: Received from: [place]:    Blood Glucose  Monitoring Suppl (ACCU-CHEK AVIVA PLUS) w/Device KIT USE TO CHECK BLOOD SUGAR ONCE A DAY    Blood Pressure Monitoring (BLOOD PRESSURE MONITOR/ARM) DEVI Use to check blood pressure of upper arm daily as needed for hypertension (I10)    DULoxetine (CYMBALTA) 30 MG capsule TAKE 1 CAPSULE BY MOUTH EVERY DAY    ezetimibe (ZETIA) 10 MG tablet TAKE 1 TABLET (10 MG TOTAL) BY MOUTH DAILY.    fluticasone (FLONASE) 50 MCG/ACT nasal spray PLACE 2 SPRAYS INTO BOTH NOSTRILS DAILY.    furosemide (LASIX) 20 MG tablet TAKE 1 TABLET BY MOUTH EVERY DAY    gabapentin (NEURONTIN) 100 MG capsule TAKE 1 CAPSULE EVERY MORNING    gabapentin (NEURONTIN) 300 MG capsule Take 1 capsule (300 mg total) by mouth at bedtime. In addition to the 113m capsule in the morning    glipiZIDE (GLUCOTROL XL) 10 MG 24 hr tablet TAKE 1 TABLET EVERY DAY    glucose blood (ACCU-CHEK AVIVA PLUS) test strip TEST BLOOD SUGAR ONE TIME DAILY    levothyroxine (SYNTHROID) 75 MCG tablet Take 1 tablet (75 mcg total) by mouth daily.    metFORMIN (GLUCOPHAGE-XR) 750 MG 24 hr tablet TAKE 1 TABLET BY MOUTH 2 TIMES DAILY.    Omega-3 Fatty Acids (FISH OIL) 1000 MG CAPS Take 1 capsule by mouth daily. (Patient not taking: Reported on 11/05/2020) 03/27/2015: Received from: [place] Take by mouth.   oxybutynin (DITROPAN) 5 MG tablet TAKE 1 TABLET 2 TO 3 TIMES  DAILY AS NEEDED FOR  URINARY FREQUENCY    quinapril (ACCUPRIL) 40 MG tablet TAKE 2 TABLETS BY MOUTH EVERY DAY    tizanidine (ZANAFLEX) 2 MG capsule Take 1-2 capsules (2-4 mg total) by mouth at bedtime as needed for muscle spasms.    No facility-administered encounter medications on file as of 06/05/2021.    Patient Active Problem List   Diagnosis Date Noted   Weakness of both lower extremities 06/21/2020   Hypertensive retinopathy of both eyes 12/10/2017   Polyuria 04/01/2015   Breast fibroadenoma 03/27/2015   Diabetes mellitus type 2 with neurological manifestations  ([place]) 03/27/2015   Edema 03/27/2015   HLD (hyperlipidemia) 03/27/2015   Hypertension 03/27/2015   Hypothyroid 03/27/2015   Lactose intolerance 03/27/2015   Lumbar disc disease 03/27/2015   Nocturia 03/27/2015   Osteoarthritis 03/27/2015   Lumbar canal stenosis 03/27/2015   B12 deficiency 03/27/2015   Arthritis, degenerative 12/31/2014   Enthesopathy of hip 12/31/2014   Dizziness and giddiness 12/31/2014   Calcification of intervertebral cartilage or disc of lumbar region 12/31/2014   Benign breast neoplasm 12/31/2014   DDD (degenerative disc disease), lumbar 03/27/2014   Neuritis or radiculitis due to rupture of lumbar intervertebral disc 03/26/2014    Conditions to be addressed/monitored: Palliative Care  Patient Care Plan: Fall Risk (Adult)     Problem Identified: Fall Risk      Long-Range Goal: Absence of Fall and Fall-Related Injury   Start Date: 04/16/2021  Expected End Date: 07/16/2021  Priority: High  Note:   Current Barriers:  High Risk for Falls r/t unsteady gait r/t chronic joint and back pain in patient with DM, Dementia and HTN.  Clinical Goal(s):  Over the next 120 days, patient will not experience falls or require emergent care d/t fall related injuries  Interventions:  Collaboration with Caryn Section Kirstie Peri, MD regarding development and update of comprehensive plan of care as evidenced by provider attestation and co-signature Inter-disciplinary care team collaboration (see longitudinal plan of care) Reviewed safety and fall prevention. Discussed ability to perform ADL's and tasks in the home with patient's daughter/caregiver [name]. Reports Mrs. Oviatt has complained of muscle spasms but has not experienced a recent fall. She completed a virtual visit earlier today. Reports following recommended safety precautions.  Discussed long term plan. Reports she does not require a higher level of care/skilled facility however she would benefit from assistance in the home.  Pending feedback from the Palliative Care staff regarding staffing/availability to conduct an initial assessment.  Update on 06/05/21: Outreach with [facility]. Per staff, they are pending receipt of a face sheet and updated progress note. They will review Mrs. Worster's care for eligibility and schedule an evaluation once documents are received. Mrs. [facility] daughter/caregiver [name] updated. Will follow up within the next month regarding start of services.  Self-Care Deficits/Patient Goals:  Ensure patient utilizes assistive device appropriately with all ambulation Ensure pathways are clear and well lit Ensure patient wears secure fitting, skid free footwear at all times when ambulating Consider options for in-home assistance and Adult Day Programs Notify provider or care management team if functional status changes or declines   Follow Up Plan:  Will follow up within the next month     PLAN: A member of the care management team will follow up within the next month.   Cristy Friedlander Health/THN Care Management [facility] [phone]

## 2021-06-05 NOTE — Patient Instructions (Addendum)
Thank you for allowing the Chronic Care Management team to participate in your care.    Patient Care Plan: Fall Risk (Adult)     Problem Identified: Fall Risk      Long-Range Goal: Absence of Fall and Fall-Related Injury   Start Date: 04/16/2021  Expected End Date: 07/16/2021  Priority: High  Note:   Current Barriers:  High Risk for Falls r/t unsteady gait r/t chronic joint and back pain in patient with DM, Dementia and HTN.  Clinical Goal(s):  Over the next 120 days, patient will not experience falls or require emergent care d/t fall related injuries  Interventions:  Collaboration with Caryn Section Kirstie Peri, MD regarding development and update of comprehensive plan of care as evidenced by provider attestation and co-signature Inter-disciplinary care team collaboration (see longitudinal plan of care) Reviewed safety and fall prevention. Discussed ability to perform ADL's and tasks in the home with patient's daughter/caregiver [name]. Reports Mrs. Borrero has complained of muscle spasms but has not experienced a recent fall. She completed a virtual visit earlier today. Reports following recommended safety precautions.  Discussed long term plan. Reports she does not require a higher level of care/skilled facility however she would benefit from assistance in the home. Pending feedback from the Palliative Care staff regarding staffing/availability to conduct an initial assessment.  Update on 06/05/21: Outreach with [place]. Per staff, they are pending receipt of a face sheet and updated progress note. They will review Mrs. Rydberg's care for eligibility and schedule an evaluation once documents are received. Mrs. [facility] daughter/caregiver [name] updated. Will follow up within the next month regarding start of services.  Self-Care Deficits/Patient Goals:  Ensure patient utilizes assistive device appropriately with all ambulation Ensure pathways are clear and well lit Ensure  patient wears secure fitting, skid free footwear at all times when ambulating Consider options for in-home assistance and Adult Day Programs Notify provider or care management team if functional status changes or declines   Follow Up Plan:  Will follow up within the next month      Mrs. [facility] daughter/caregiver [name] verbalized understanding of the information discussed during the telephonic outreach. Declined need for mailed/printed instructions. A member of the care management team will follow up within the next month.   Cristy Friedlander [facility]/THN Care Management [facility] [phone]

## 2021-06-13 DIAGNOSIS — I10 Essential (primary) hypertension: Secondary | ICD-10-CM

## 2021-06-20 ENCOUNTER — Ambulatory Visit (INDEPENDENT_AMBULATORY_CARE_PROVIDER_SITE_OTHER): Payer: Medicare HMO

## 2021-06-20 DIAGNOSIS — R413 Other amnesia: Secondary | ICD-10-CM

## 2021-06-20 DIAGNOSIS — I10 Essential (primary) hypertension: Secondary | ICD-10-CM

## 2021-06-20 NOTE — Chronic Care Management (AMB) (Signed)
Chronic Care Management   CCM RN Visit Note  06/20/2021 Name: [name] [name] MRN: [id number] DOB: 02-Jan-1933  Subjective: [name] [name] is a 87 y.o. year old female who is a primary care patient of [name] Section, Kirstie Peri, MD. The care management team was consulted for assistance with disease management and care coordination needs.    Care Coordination: Palliative Care Order  Consent to Services:  The patient was given information about Chronic Care Management services, agreed to services, and gave verbal consent prior to initiation of services.  Please see initial visit note for detailed documentation.  .   Assessment: Review of patient past medical history, allergies, medications, health status, including review of consultants reports, laboratory and other test data, was performed as part of comprehensive evaluation and provision of chronic care management services.   SDOH (Social Determinants of Health) assessments and interventions performed:    CCM Care Plan  Allergies  Allergen Reactions   Aricept [Donepezil]     Irritability    Lovastatin     Weakness   Pravastatin Sodium     Muscle weakness   Simvastatin     Fatigue    Outpatient Encounter Medications as of 06/20/2021  Medication Sig Note   Accu-Chek Softclix Lancets lancets USE TO CHECK BLOOD SUGAR ONCE A DAY    Alcohol Swabs (DROPSAFE ALCOHOL PREP) 70 % PADS USE TO TEST BLOOD SUGAR EVERY DAY    ALPRAZolam (XANAX) 0.25 MG tablet Take 1 tablet (0.25 mg total) by mouth every 6 (six) hours as needed for anxiety.    amLODipine (NORVASC) 2.5 MG tablet Take 1 tablet (2.5 mg total) by mouth daily.    ascorbic acid (VITAMIN C) 500 MG tablet Take 500 mg by mouth daily.    aspirin 81 MG tablet Take 1 tablet by mouth daily. 03/27/2015: Received from: [place]:    Blood Glucose Monitoring Suppl (ACCU-CHEK AVIVA PLUS) w/Device KIT USE TO CHECK BLOOD SUGAR ONCE A DAY    Blood Pressure Monitoring  (BLOOD PRESSURE MONITOR/ARM) DEVI Use to check blood pressure of upper arm daily as needed for hypertension (I10)    DULoxetine (CYMBALTA) 30 MG capsule TAKE 1 CAPSULE BY MOUTH EVERY DAY    ezetimibe (ZETIA) 10 MG tablet TAKE 1 TABLET (10 MG TOTAL) BY MOUTH DAILY.    fluticasone (FLONASE) 50 MCG/ACT nasal spray PLACE 2 SPRAYS INTO BOTH NOSTRILS DAILY.    furosemide (LASIX) 20 MG tablet TAKE 1 TABLET BY MOUTH EVERY DAY    gabapentin (NEURONTIN) 100 MG capsule TAKE 1 CAPSULE EVERY MORNING    gabapentin (NEURONTIN) 300 MG capsule Take 1 capsule (300 mg total) by mouth at bedtime. In addition to the 157m capsule in the morning    glipiZIDE (GLUCOTROL XL) 10 MG 24 hr tablet TAKE 1 TABLET EVERY DAY    glucose blood (ACCU-CHEK AVIVA PLUS) test strip TEST BLOOD SUGAR ONE TIME DAILY    levothyroxine (SYNTHROID) 75 MCG tablet Take 1 tablet (75 mcg total) by mouth daily.    metFORMIN (GLUCOPHAGE-XR) 750 MG 24 hr tablet TAKE 1 TABLET BY MOUTH 2 TIMES DAILY.    Omega-3 Fatty Acids (FISH OIL) 1000 MG CAPS Take 1 capsule by mouth daily. (Patient not taking: Reported on 11/05/2020) 03/27/2015: [place] [facility]: [facility] Take by mouth.   oxybutynin (DITROPAN) 5 MG tablet TAKE 1 TABLET 2 TO 3 TIMES DAILY AS NEEDED FOR  URINARY FREQUENCY    quinapril (ACCUPRIL) 40 MG tablet TAKE  2 TABLETS BY MOUTH EVERY DAY    tizanidine (ZANAFLEX) 2 MG capsule Take 1-2 capsules (2-4 mg total) by mouth at bedtime as needed for muscle spasms.    No facility-administered encounter medications on file as of 06/20/2021.    Patient Active Problem List   Diagnosis Date Noted   Weakness of both lower extremities 06/21/2020   Hypertensive retinopathy of both eyes 12/10/2017   Polyuria 04/01/2015   Breast fibroadenoma 03/27/2015   Diabetes mellitus type 2 with neurological manifestations ([place]) 03/27/2015   Edema 03/27/2015   HLD (hyperlipidemia) 03/27/2015   Hypertension 03/27/2015   Hypothyroid  03/27/2015   Lactose intolerance 03/27/2015   Lumbar disc disease 03/27/2015   Nocturia 03/27/2015   Osteoarthritis 03/27/2015   Lumbar canal stenosis 03/27/2015   B12 deficiency 03/27/2015   Arthritis, degenerative 12/31/2014   Enthesopathy of hip 12/31/2014   Dizziness and giddiness 12/31/2014   Calcification of intervertebral cartilage or disc of lumbar region 12/31/2014   Benign breast neoplasm 12/31/2014   DDD (degenerative disc disease), lumbar 03/27/2014   Neuritis or radiculitis due to rupture of lumbar intervertebral disc 03/26/2014   Conditions to be addressed/monitored: Memory Change and [place] : Fall Risk (Adult)  Updates made by [name] Labella, RN since 06/20/2021      Problem: Fall Risk      Long-Range Goal: Absence of Fall and Fall-Related Injury   Start Date: 04/16/2021  Expected End Date: 07/16/2021  Priority: High  Note:   Current Barriers:  High Risk for Falls r/t unsteady gait r/t chronic joint and back pain in patient with DM, Dementia and HTN.  Clinical Goal(s):  Over the next 120 days, patient will not experience falls or require emergent care d/t fall related injuries  Interventions:  Collaboration with [name] Section Kirstie Peri, MD regarding development and update of comprehensive plan of care as evidenced by provider attestation and co-signature Inter-disciplinary care team collaboration (see longitudinal plan of care) Reviewed safety and fall prevention. Discussed ability to perform ADL's and tasks in the home with patient's daughter/caregiver [name] [name]. Reports [name] [name] has complained of muscle spasms but has not experienced a recent fall. She completed a virtual visit earlier today. Reports following recommended safety precautions.  Discussed long term plan. Reports she does not require a higher level of care/skilled facility however she would benefit from assistance in the home. Pending feedback from the Palliative Care staff regarding  staffing/availability to conduct an initial assessment.  Update on 06/20/21: Message forwarded to PCP regarding need for new Palliative Care order. Per [facility] staff, [name] [name] can be evaluated after the order is received. Requested that order be faxed to [phone].   Self-Care Deficits/Patient Goals:  Ensure patient utilizes assistive device appropriately with all ambulation Ensure pathways are clear and well lit Ensure patient wears secure fitting, skid free footwear at all times when ambulating Consider options for in-home assistance and Adult Day Programs Notify provider or care management team if functional status changes or declines   Follow Up Plan:  Will follow up within the next month     PLAN Message forwarded to provider regarding request for new Palliative order. A member of the care management team will follow up within the next month  [facility] Care Management [facility] [phone]

## 2021-06-23 NOTE — Patient Instructions (Signed)
Thank you for allowing the Chronic Care Management team to participate in your care.    Care Plan : Fall Risk (Adult)  Updates made by [place] Labella, RN since 06/20/2021      Problem: Fall Risk      Long-Range Goal: Absence of Fall and Fall-Related Injury   Start Date: 04/16/2021  Expected End Date: 07/16/2021  Priority: High  Note:   Current Barriers:  High Risk for Falls r/t unsteady gait r/t chronic joint and back pain in patient with DM, Dementia and HTN.  Clinical Goal(s):  Over the next 120 days, patient will not experience falls or require emergent care d/t fall related injuries  Interventions:  Collaboration with [place] Section Kirstie Peri, MD regarding development and update of comprehensive plan of care as evidenced by provider attestation and co-signature Inter-disciplinary care team collaboration (see longitudinal plan of care) Reviewed safety and fall prevention. Discussed ability to perform ADL's and tasks in the home with patient's daughter/caregiver [name]. Reports Mrs. Gange has complained of muscle spasms but has not experienced a recent fall. She completed a virtual visit earlier today. Reports following recommended safety precautions.  Discussed long term plan. Reports she does not require a higher level of care/skilled facility however she would benefit from assistance in the home. Pending feedback from the Palliative Care staff regarding staffing/availability to conduct an initial assessment.  Update on 06/20/21: Message forwarded to PCP regarding need for new Palliative Care order. Per [facility] staff, Mrs. Ruis can be evaluated after the order is received. Requested that order be faxed to [phone].   Self-Care Deficits/Patient Goals:  Ensure patient utilizes assistive device appropriately with all ambulation Ensure pathways are clear and well lit Ensure patient wears secure fitting, skid free footwear at all times when ambulating Consider options  for in-home assistance and Adult Day Programs Notify provider or care management team if functional status changes or declines   Follow Up Plan:  Will follow up within the next month     Care Coordination Only: Message forwarded to provider regarding request for new Palliative order. A member of the care management team will follow up within the next month   [facility] Care Management [facility] [phone]

## 2021-06-27 ENCOUNTER — Other Ambulatory Visit: Payer: Self-pay | Admitting: Family Medicine

## 2021-06-28 NOTE — Telephone Encounter (Signed)
Requested medications are due for refill today yes  Requested medications are on the active medication list yes  Last refill 05/30/21  Last visit 05/23/21  Future visit scheduled 09/01/21  Notes to clinic Not Delegated.  Requested Prescriptions  Pending Prescriptions Disp Refills   tizanidine (ZANAFLEX) 2 MG capsule [Pharmacy Med Name: TIZANIDINE HCL 2 MG CAPSULE] 30 capsule 1    Sig: Take 1-2 capsules (2-4 mg total) by mouth at bedtime as needed for muscle spasms.     Not Delegated - Cardiovascular:  Alpha-2 Agonists - tizanidine Failed - 06/27/2021  4:47 PM      Failed - This refill cannot be delegated      Passed - Valid encounter within last 6 months    Recent Outpatient Visits           1 month ago Leg cramps   [facility] Birdie Sons, MD   1 month ago Primary hypertension   [place], Vickki Muff, PA-C   7 months ago Primary hypertension   [facility] Birdie Sons, MD   1 year ago Diabetes mellitus type 2 with neurological manifestations [facility])   [facility] Birdie Sons, MD   1 year ago Weakness of both lower extremities   [facility] Birdie Sons, MD

## 2021-07-14 ENCOUNTER — Other Ambulatory Visit: Payer: Self-pay | Admitting: Family Medicine

## 2021-07-14 DIAGNOSIS — I10 Essential (primary) hypertension: Secondary | ICD-10-CM

## 2021-07-14 NOTE — Telephone Encounter (Signed)
[facility] faxed refill request for the following medications:   DULoxetine (CYMBALTA) 30 MG capsule   Please advise.

## 2021-07-15 ENCOUNTER — Ambulatory Visit (INDEPENDENT_AMBULATORY_CARE_PROVIDER_SITE_OTHER): Payer: Medicare HMO

## 2021-07-15 DIAGNOSIS — E1149 Type 2 diabetes mellitus with other diabetic neurological complication: Secondary | ICD-10-CM

## 2021-07-15 DIAGNOSIS — R413 Other amnesia: Secondary | ICD-10-CM

## 2021-07-15 DIAGNOSIS — Z9181 History of falling: Secondary | ICD-10-CM

## 2021-07-16 ENCOUNTER — Telehealth (INDEPENDENT_AMBULATORY_CARE_PROVIDER_SITE_OTHER): Payer: Medicare HMO | Admitting: Family Medicine

## 2021-07-16 DIAGNOSIS — Z91199 Patient's noncompliance with other medical treatment and regimen due to unspecified reason: Secondary | ICD-10-CM

## 2021-07-16 NOTE — Progress Notes (Signed)
Appointment cancelled after visit documentation started

## 2021-07-18 ENCOUNTER — Other Ambulatory Visit: Payer: Self-pay | Admitting: Family Medicine

## 2021-07-18 MED ORDER — DULOXETINE HCL 30 MG PO CPEP: ORAL_CAPSULE | ORAL | 0 refills | Status: DC

## 2021-07-18 NOTE — Telephone Encounter (Signed)
Requested Prescriptions  Pending Prescriptions Disp Refills  . DULoxetine (CYMBALTA) 30 MG capsule 30 capsule 0    Sig: TAKE 1 CAPSULE BY MOUTH EVERY DAY     Psychiatry: Antidepressants - SNRI Passed - 07/18/2021  1:29 PM      Passed - Last BP in normal range    BP Readings from Last 1 Encounters:  05/05/21 (!) 134/57         Passed - Valid encounter within last 6 months    Recent Outpatient Visits          2 days ago    [place], Kirstie Peri, MD   1 month ago Leg cramps   [facility] Birdie Sons, MD   2 months ago Primary hypertension   [facility], Vickki Muff, PA-C   8 months ago Primary hypertension   [facility] Birdie Sons, MD   1 year ago Diabetes mellitus type 2 with neurological manifestations [facility])   [facility] Birdie Sons, MD      Future Appointments            In 3 days [place], Kirstie Peri, MD [facility] [place]

## 2021-07-18 NOTE — Telephone Encounter (Signed)
Copied from [place] [phone]. Topic: Quick Communication - Rx Refill/Question >> Jul 18, 2021 12:23 PM Leward Quan A wrote: Medication: DULoxetine (CYMBALTA) 30 MG capsule   Has the patient contacted their pharmacy? Yes.  Pharmacy called  (Agent: If no, request that the patient contact the pharmacy for the refill. If patient does not wish to contact the pharmacy document the reason why and proceed with request.) (Agent: If yes, when and what did the pharmacy advise?)  Preferred Pharmacy (with phone number or street name): [place], [place]  Phone:  [phone] Fax:  [phone]    Has the patient been seen for an appointment in the last year OR does the patient have an upcoming appointment? Yes.    Agent: Please be advised that RX refills may take up to 3 business days. We ask that you follow-up with your pharmacy.

## 2021-07-21 ENCOUNTER — Other Ambulatory Visit: Payer: Self-pay

## 2021-07-21 ENCOUNTER — Telehealth (INDEPENDENT_AMBULATORY_CARE_PROVIDER_SITE_OTHER): Payer: Medicare HMO | Admitting: Family Medicine

## 2021-07-21 DIAGNOSIS — R296 Repeated falls: Secondary | ICD-10-CM | POA: Diagnosis not present

## 2021-07-21 DIAGNOSIS — R29898 Other symptoms and signs involving the musculoskeletal system: Secondary | ICD-10-CM

## 2021-07-21 DIAGNOSIS — R32 Unspecified urinary incontinence: Secondary | ICD-10-CM

## 2021-07-21 DIAGNOSIS — M5116 Intervertebral disc disorders with radiculopathy, lumbar region: Secondary | ICD-10-CM | POA: Diagnosis not present

## 2021-07-21 DIAGNOSIS — R413 Other amnesia: Secondary | ICD-10-CM

## 2021-07-21 NOTE — Progress Notes (Signed)
MyChart Video Visit    Virtual Visit via Video Note   This visit type was conducted due to national recommendations for restrictions regarding the COVID-19 Pandemic (e.g. social distancing) in an effort to limit this patient's exposure and mitigate transmission in our community. This patient is at least at moderate risk for complications without adequate follow up. This format is felt to be most appropriate for this patient at this time. Physical exam was limited by quality of the video and audio technology used for the visit.   Patient location: home Provider location: [facility]  I discussed the limitations of evaluation and management by telemedicine and the availability of in person appointments. The patient expressed understanding and agreed to proceed.  Patient: [name] [name]   DOB: 04/11/1933   87 y.o. Female  MRN: [id number] Visit Date: 07/21/2021  Today's healthcare provider: Lelon Huh, MD   Chief Complaint  Patient presents with   Follow-up   Subjective    HPI  Referral for Palliative Care: Patient's daughter [name] is requesting a referral for Hospice palliative care due to a decline in patient's health. Patient has worsening memory problems and frequent falls. She is unable to keep checks on her blood sugar to manage diabetes or manage her own medications. She is unsteady on her feet and has frequent difficulties with bladder control.      Medications: Outpatient Medications Prior to Visit  Medication Sig   Accu-Chek Softclix Lancets lancets USE TO CHECK BLOOD SUGAR ONCE A DAY   Alcohol Swabs (DROPSAFE ALCOHOL PREP) 70 % PADS USE TO TEST BLOOD SUGAR EVERY DAY   ALPRAZolam (XANAX) 0.25 MG tablet Take 1 tablet (0.25 mg total) by mouth every 6 (six) hours as needed for anxiety.   amLODipine (NORVASC) 2.5 MG tablet Take 1 tablet (2.5 mg total) by mouth daily.   ascorbic acid (VITAMIN C) 500 MG tablet Take 500 mg by mouth daily.   aspirin 81 MG tablet Take 1 tablet by  mouth daily.   Blood Glucose Monitoring Suppl (ACCU-CHEK AVIVA PLUS) w/Device KIT USE TO CHECK BLOOD SUGAR ONCE A DAY   DULoxetine (CYMBALTA) 30 MG capsule TAKE 1 CAPSULE BY MOUTH EVERY DAY   ezetimibe (ZETIA) 10 MG tablet TAKE 1 TABLET (10 MG TOTAL) BY MOUTH DAILY.   fluticasone (FLONASE) 50 MCG/ACT nasal spray PLACE 2 SPRAYS INTO BOTH NOSTRILS DAILY.   furosemide (LASIX) 20 MG tablet TAKE 1 TABLET BY MOUTH EVERY DAY   gabapentin (NEURONTIN) 100 MG capsule TAKE 1 CAPSULE EVERY MORNING   gabapentin (NEURONTIN) 300 MG capsule Take 1 capsule (300 mg total) by mouth at bedtime. In addition to the 14m capsule in the morning   glipiZIDE (GLUCOTROL XL) 10 MG 24 hr tablet TAKE 1 TABLET EVERY DAY   glucose blood (ACCU-CHEK AVIVA PLUS) test strip TEST BLOOD SUGAR ONE TIME DAILY   levothyroxine (SYNTHROID) 75 MCG tablet TAKE 1 TABLET EVERY DAY   metFORMIN (GLUCOPHAGE-XR) 750 MG 24 hr tablet TAKE 1 TABLET BY MOUTH 2 TIMES DAILY.   oxybutynin (DITROPAN) 5 MG tablet TAKE 1 TABLET 2 TO 3 TIMES DAILY AS NEEDED FOR  URINARY FREQUENCY   quinapril (ACCUPRIL) 40 MG tablet TAKE 2 TABLETS BY MOUTH EVERY DAY   tizanidine (ZANAFLEX) 2 MG capsule Take 1-2 capsules (2-4 mg total) by mouth at bedtime as needed for muscle spasms.   Blood Pressure Monitoring (BLOOD PRESSURE MONITOR/ARM) DEVI Use to check blood pressure of upper arm daily as needed for hypertension (I10)  Omega-3 Fatty Acids (FISH OIL) 1000 MG CAPS Take 1 capsule by mouth daily. (Patient not taking: Reported on 11/05/2020)   No facility-administered medications prior to visit.    Review of Systems  Constitutional:  Negative for appetite change, chills, fatigue and fever.  Respiratory:  Negative for chest tightness and shortness of breath.   Cardiovascular:  Negative for chest pain and palpitations.  Gastrointestinal:  Negative for abdominal pain, nausea and vomiting.  Neurological:  Negative for dizziness and weakness.  Psychiatric/Behavioral:          Memory impairment     Objective    There were no vitals taken for this visit.   Physical Exam   Awake, alert, oriented x 2. In no apparent distress   Assessment & Plan     1. Memory change  - Amb Referral to Palliative Care  2. Frequent falls  - Amb Referral to Palliative Care  3. Weakness of both lower extremities  - Amb Referral to Palliative Care  4. Urinary incontinence, unspecified type   5. Neuritis or radiculitis due to rupture of lumbar intervertebral disc  - Amb Referral to Palliative Care        I discussed the assessment and treatment plan with the patient. The patient was provided an opportunity to ask questions and all were answered. The patient agreed with the plan and demonstrated an understanding of the instructions.   The patient was advised to call back or seek an in-person evaluation if the symptoms worsen or if the condition fails to improve as anticipated.  I provided 12 minutes of non-face-to-face time during this encounter.  The entirety of the information documented in the History of Present Illness, Review of Systems and Physical Exam were personally obtained by me. Portions of this information were initially documented by the CMA and reviewed by me for thoroughness and accuracy.    Lelon Huh, MD [facility] [phone] (phone) [phone] (fax)  [place]

## 2021-07-21 NOTE — Chronic Care Management (AMB) (Signed)
Chronic Care Management   CCM RN Visit Note   Name: [name] [name] MRN: [id number] DOB: Apr 06, 1933  Subjective: [name] [name] is a 87 y.o. year old female who is a primary care patient of [name] Section, Kirstie Peri, MD. The care management team was consulted for assistance with disease management and care coordination needs.    Engaged with patient's daughter [name] [name] by telephone for follow up visit in response to provider referral for case management and care coordination services.   Consent to Services:  The patient was given information about Chronic Care Management services, agreed to services, and gave verbal consent prior to initiation of services.  Please see initial visit note for detailed documentation.    Assessment: Review of patient past medical history, allergies, medications, health status, including review of consultants reports, laboratory and other test data, was performed as part of comprehensive evaluation and provision of chronic care management services.   SDOH (Social Determinants of Health) assessments and interventions performed: No   CCM Care Plan  Allergies  Allergen Reactions   Aricept [Donepezil]     Irritability    Lovastatin     Weakness   Pravastatin Sodium     Muscle weakness   Simvastatin     Fatigue    Outpatient Encounter Medications as of 07/15/2021  Medication Sig Note   Accu-Chek Softclix Lancets lancets USE TO CHECK BLOOD SUGAR ONCE A DAY    Alcohol Swabs (DROPSAFE ALCOHOL PREP) 70 % PADS USE TO TEST BLOOD SUGAR EVERY DAY    ALPRAZolam (XANAX) 0.25 MG tablet Take 1 tablet (0.25 mg total) by mouth every 6 (six) hours as needed for anxiety.    amLODipine (NORVASC) 2.5 MG tablet Take 1 tablet (2.5 mg total) by mouth daily.    ascorbic acid (VITAMIN C) 500 MG tablet Take 500 mg by mouth daily.    aspirin 81 MG tablet Take 1 tablet by mouth daily. 03/27/2015: Received from: [place]:    Blood Glucose Monitoring  Suppl (ACCU-CHEK AVIVA PLUS) w/Device KIT USE TO CHECK BLOOD SUGAR ONCE A DAY    Blood Pressure Monitoring (BLOOD PRESSURE MONITOR/ARM) DEVI Use to check blood pressure of upper arm daily as needed for hypertension (I10)    ezetimibe (ZETIA) 10 MG tablet TAKE 1 TABLET (10 MG TOTAL) BY MOUTH DAILY.    fluticasone (FLONASE) 50 MCG/ACT nasal spray PLACE 2 SPRAYS INTO BOTH NOSTRILS DAILY.    furosemide (LASIX) 20 MG tablet TAKE 1 TABLET BY MOUTH EVERY DAY    gabapentin (NEURONTIN) 100 MG capsule TAKE 1 CAPSULE EVERY MORNING    gabapentin (NEURONTIN) 300 MG capsule Take 1 capsule (300 mg total) by mouth at bedtime. In addition to the 142m capsule in the morning    glipiZIDE (GLUCOTROL XL) 10 MG 24 hr tablet TAKE 1 TABLET EVERY DAY    glucose blood (ACCU-CHEK AVIVA PLUS) test strip TEST BLOOD SUGAR ONE TIME DAILY    levothyroxine (SYNTHROID) 75 MCG tablet TAKE 1 TABLET EVERY DAY    metFORMIN (GLUCOPHAGE-XR) 750 MG 24 hr tablet TAKE 1 TABLET BY MOUTH 2 TIMES DAILY.    Omega-3 Fatty Acids (FISH OIL) 1000 MG CAPS Take 1 capsule by mouth daily. (Patient not taking: Reported on 11/05/2020) 03/27/2015: Received from: [facility] Take by mouth.   oxybutynin (DITROPAN) 5 MG tablet TAKE 1 TABLET 2 TO 3 TIMES DAILY AS NEEDED FOR  URINARY FREQUENCY    quinapril (ACCUPRIL) 40 MG tablet TAKE 2  TABLETS BY MOUTH EVERY DAY    tizanidine (ZANAFLEX) 2 MG capsule Take 1-2 capsules (2-4 mg total) by mouth at bedtime as needed for muscle spasms.    [DISCONTINUED] DULoxetine (CYMBALTA) 30 MG capsule TAKE 1 CAPSULE BY MOUTH EVERY DAY    No facility-administered encounter medications on file as of 07/15/2021.    Patient Active Problem List   Diagnosis Date Noted   Weakness of both lower extremities 06/21/2020   Hypertensive retinopathy of both eyes 12/10/2017   Polyuria 04/01/2015   Breast fibroadenoma 03/27/2015   Diabetes mellitus type 2 with neurological manifestations ([place]) 03/27/2015    Edema 03/27/2015   HLD (hyperlipidemia) 03/27/2015   Hypertension 03/27/2015   Hypothyroid 03/27/2015   Lactose intolerance 03/27/2015   Lumbar disc disease 03/27/2015   Nocturia 03/27/2015   Osteoarthritis 03/27/2015   Lumbar canal stenosis 03/27/2015   B12 deficiency 03/27/2015   Arthritis, degenerative 12/31/2014   Enthesopathy of hip 12/31/2014   Dizziness and giddiness 12/31/2014   Calcification of intervertebral cartilage or disc of lumbar region 12/31/2014   Benign breast neoplasm 12/31/2014   DDD (degenerative disc disease), lumbar 03/27/2014   Neuritis or radiculitis due to rupture of lumbar intervertebral disc 03/26/2014    Conditions to be addressed/monitored:Memory Change, Fall Risk, DM Patient Care Plan: Fall Risk (Adult)     Problem Identified: Fall Risk      Long-Range Goal: Absence of Fall and Fall-Related Injury   Start Date: 04/16/2021  Expected End Date: 07/16/2021  Priority: High  Note:   Current Barriers:  High Risk for Falls r/t unsteady gait r/t chronic joint and back pain in patient with DM, Dementia and HTN.  Clinical Goal(s):  Over the next 120 days, patient will not experience falls or require emergent care d/t fall related injuries  Interventions:  Collaboration with [name] Section Kirstie Peri, MD regarding development and update of comprehensive plan of care as evidenced by provider attestation and co-signature Inter-disciplinary care team collaboration (see longitudinal plan of care) Reviewed safety and fall prevention. Discussed with patient's daughter/caregiver [name] [name]. [name] [name] status has not improved. She is able to complete ADL's with minimal assistance but will require assistance in the home. Discussed previous plan for palliative care vs Hospice. [name] [name] reports not receiving outreach from Palliative Care. Received call back from [place]. The team will gladly assist however expressed concerns that she may not be  eligible d/t diagnosis. Discussed plan for virtual visit with PCP on 07/21/21. Will update the [facility] team if patient meets criteria.    Self-Care Deficits/Patient Goals:  Ensure patient utilizes assistive device appropriately with all ambulation Ensure pathways are clear and well lit Ensure patient wears secure fitting, skid free footwear at all times when ambulating Consider options for in-home assistance and Adult Day Programs Notify provider or care management team if functional status changes or declines     Patient Care Plan: Diabetes Type 2 (Adult)     Problem Identified: Glycemic Management (Diabetes, Type 2)      Long-Range Goal: Glycemic Management Optimized Completed 07/16/2021  Start Date: 11/25/2020  Expected End Date: 03/14/2021  Priority: High  Note:    Lab Results  Component Value Date   HGBA1C 6.5 (A) 05/05/2021    Current Barriers:  Chronic Disease Management support and educational needs r/t Diabetes management  Case Manager Clinical Goal(s):  Over the next 120 days, patient will demonstrate improved adherence to prescribed treatment plan for Diabetes management as evidenced by taking medications as prescribed,  monitoring and recording of CBG, and adherence to an ADA/ carb modified diet.  Interventions:  Collaboration with Birdie Sons, MD regarding development and update of comprehensive plan of care as evidenced by provider attestation and co-signature Inter-disciplinary care team collaboration (see longitudinal plan of care) Reviewed medications and compliance with treatment plan. [name] [name] continues to prepares medications. Reports Mrs. Laningham continues to refuse blood glucose monitoring. Reviewed s/sx of hypoglycemia and hyperglycemia.  As of August, patient was at A1C goal of less than 7%. Unable to determine if readings have been within range d/t patient refusing blood glucose monitoring. [name] [name] will continue to monitor for symptoms and update  the team if additional assistance is required.  Patient Goals/Self-Care Activities: Caregiver will: Ensure patient takes medications as prescribed Ensure patient attends all scheduled provider appointments Encourage patient to monitor blood glucose levels consistently and utilize recommended interventions Adhere to prescribed ADA/carb modified diet when preparing meals Notify provider or care management team with questions and new concerns as needed        PLAN A member of the care management team will follow up next week.  Cristy Friedlander Heatlh/THN Care Management [facility] [phone]

## 2021-07-21 NOTE — Patient Instructions (Signed)
Thank you for allowing the Chronic Care Management [name] to participate in your care.    Patient Care Plan: Fall Risk (Adult)     Problem Identified: Fall Risk      Long-Range Goal: Absence of Fall and Fall-Related Injury   Start Date: 04/16/2021  Expected End Date: 07/16/2021  Priority: High  Note:   Current Barriers:  High Risk for Falls r/t unsteady gait r/t chronic joint and back pain in patient with DM, Dementia and HTN.  Clinical Goal(s):  Over the next 120 days, patient will not experience falls or require emergent care d/t fall related injuries  Interventions:  Collaboration with [name] Section [name] Peri, [name] [name] regarding development and update of comprehensive plan of care as evidenced by provider attestation and co-signature Inter-disciplinary care [name] collaboration (see longitudinal plan of care) Reviewed safety and fall prevention. Discussed with patient's daughter/caregiver [name] [name]. [name] [name] status has not improved. She is able to complete ADL's with minimal assistance but will require assistance in the home. Discussed previous plan for palliative care vs Hospice. [name] [name] reports not receiving outreach from Palliative Care. Received call back from [place]. The [name] will gladly assist however expressed concerns that she may not be eligible d/t diagnosis. Discussed plan for virtual visit with PCP on 07/21/21. Will update the [name] [name] if patient meets criteria.    Self-Care Deficits/Patient Goals:  Ensure patient utilizes assistive device appropriately with all ambulation Ensure pathways are clear and well lit Ensure patient wears secure fitting, skid free footwear at all times when ambulating Consider options for in-home assistance and Adult Day Programs Notify provider or care management [name] if functional status changes or declines     Patient Care Plan: Diabetes Type 2 (Adult)     Problem Identified: Glycemic Management (Diabetes,  Type 2)      Long-Range Goal: Glycemic Management Optimized Completed 07/16/2021  Start Date: 11/25/2020  Expected End Date: 03/14/2021  Priority: High  Note:    Lab Results  Component Value Date   HGBA1C 6.5 (A) 05/05/2021    Current Barriers:  Chronic Disease Management support and educational needs r/t Diabetes management  Case Manager Clinical Goal(s):  Over the next 120 days, patient will demonstrate improved adherence to prescribed treatment plan for Diabetes management as evidenced by taking medications as prescribed, monitoring and recording of CBG, and adherence to an ADA/ carb modified diet.  Interventions:  Collaboration with [name] Sons, [name] [name] regarding development and update of comprehensive plan of care as evidenced by provider attestation and co-signature Inter-disciplinary care [name] collaboration (see longitudinal plan of care) Reviewed medications and compliance with treatment plan. [name] [name] continues to prepares medications. Reports [name] [name] continues to refuse blood glucose monitoring. Reviewed s/sx of hypoglycemia and hyperglycemia.  As of August, patient was at A1C goal of less than 7%. Unable to determine if readings have been within range d/t patient refusing blood glucose monitoring. [name] [name] will continue to monitor for symptoms and update the [name] if additional assistance is required.  Patient Goals/Self-Care Activities: Caregiver will: Ensure patient takes medications as prescribed Ensure patient attends all scheduled provider appointments Encourage patient to monitor blood glucose levels consistently and utilize recommended interventions Adhere to prescribed ADA/carb modified diet when preparing meals Notify provider or care management [name] with questions and new concerns as needed        [name] [name]'s daughter/caregiver [name] [name] verbalized understanding of the information discussed during the telephonic outreach. Declined need for mailed/printed  instructions. A member of the  care management [name] will follow up next week.  [name] [name] Heatl[facility]/THN Care Management [facility] [phone]

## 2021-07-22 ENCOUNTER — Telehealth: Payer: Self-pay | Admitting: Student

## 2021-07-22 NOTE — Telephone Encounter (Signed)
Spoke with patient's daughter Stephens November, regarding the Palliative referral/services and all questions were answered and she was in agreement with scheduling visit.  I have scheduled an In-home Consult for 08/04/21 @ 2:30 PM

## 2021-08-01 ENCOUNTER — Ambulatory Visit: Payer: Self-pay

## 2021-08-01 DIAGNOSIS — Z9181 History of falling: Secondary | ICD-10-CM

## 2021-08-01 DIAGNOSIS — E1149 Type 2 diabetes mellitus with other diabetic neurological complication: Secondary | ICD-10-CM

## 2021-08-01 DIAGNOSIS — R413 Other amnesia: Secondary | ICD-10-CM

## 2021-08-01 NOTE — Patient Instructions (Signed)
Thank you for allowing the Chronic Care Management team to participate in your care.  

## 2021-08-01 NOTE — Chronic Care Management (AMB) (Signed)
Chronic Care Management   CCM RN Visit Note  08/01/2021 Name: [name] [name] MRN: [id number] DOB: [birth year]  Subjective: [name] Baity is a 87 y.o. year old female who is a primary care patient of Caryn Section, Kirstie Peri, MD. The care management team was consulted for assistance with disease management and care coordination needs.    Engaged with patient's daughter/caregiver [name] by telephone for follow up visit in response to provider referral for case management and care coordination services.   Consent to Services:  The patient was given information about Chronic Care Management services, agreed to services, and gave verbal consent prior to initiation of services.  Please see initial visit note for detailed documentation.    Assessment: Review of patient past medical history, allergies, medications, health status, including review of consultants reports, laboratory and other test data, was performed as part of comprehensive evaluation and provision of chronic care management services.   SDOH (Social Determinants of Health) assessments and interventions performed:  No  CCM Care Plan  Allergies  Allergen Reactions   Aricept [Donepezil]     Irritability    Lovastatin     Weakness   Pravastatin Sodium     Muscle weakness   Simvastatin     Fatigue    Outpatient Encounter Medications as of 08/01/2021  Medication Sig Note   Accu-Chek Softclix Lancets lancets USE TO CHECK BLOOD SUGAR ONCE A DAY    Alcohol Swabs (DROPSAFE ALCOHOL PREP) 70 % PADS USE TO TEST BLOOD SUGAR EVERY DAY    ALPRAZolam (XANAX) 0.25 MG tablet Take 1 tablet (0.25 mg total) by mouth every 6 (six) hours as needed for anxiety.    amLODipine (NORVASC) 2.5 MG tablet Take 1 tablet (2.5 mg total) by mouth daily.    ascorbic acid (VITAMIN C) 500 MG tablet Take 500 mg by mouth daily.    aspirin 81 MG tablet Take 1 tablet by mouth daily. 03/27/2015: Received from: [place]:    Blood  Glucose Monitoring Suppl (ACCU-CHEK AVIVA PLUS) w/Device KIT USE TO CHECK BLOOD SUGAR ONCE A DAY    Blood Pressure Monitoring (BLOOD PRESSURE MONITOR/ARM) DEVI Use to check blood pressure of upper arm daily as needed for hypertension (I10)    DULoxetine (CYMBALTA) 30 MG capsule TAKE 1 CAPSULE BY MOUTH EVERY DAY    ezetimibe (ZETIA) 10 MG tablet TAKE 1 TABLET (10 MG TOTAL) BY MOUTH DAILY.    fluticasone (FLONASE) 50 MCG/ACT nasal spray PLACE 2 SPRAYS INTO BOTH NOSTRILS DAILY.    furosemide (LASIX) 20 MG tablet TAKE 1 TABLET BY MOUTH EVERY DAY    gabapentin (NEURONTIN) 100 MG capsule TAKE 1 CAPSULE EVERY MORNING    gabapentin (NEURONTIN) 300 MG capsule Take 1 capsule (300 mg total) by mouth at bedtime. In addition to the 172m capsule in the morning    glipiZIDE (GLUCOTROL XL) 10 MG 24 hr tablet TAKE 1 TABLET EVERY DAY    glucose blood (ACCU-CHEK AVIVA PLUS) test strip TEST BLOOD SUGAR ONE TIME DAILY    levothyroxine (SYNTHROID) 75 MCG tablet TAKE 1 TABLET EVERY DAY    metFORMIN (GLUCOPHAGE-XR) 750 MG 24 hr tablet TAKE 1 TABLET BY MOUTH 2 TIMES DAILY.    Omega-3 Fatty Acids (FISH OIL) 1000 MG CAPS Take 1 capsule by mouth daily. (Patient not taking: Reported on 11/05/2020) 03/27/2015: Received from: [facility] Take by mouth.   oxybutynin (DITROPAN) 5 MG tablet TAKE 1 TABLET 2 TO 3 TIMES DAILY AS  NEEDED FOR  URINARY FREQUENCY    quinapril (ACCUPRIL) 40 MG tablet TAKE 2 TABLETS BY MOUTH EVERY DAY    tizanidine (ZANAFLEX) 2 MG capsule Take 1-2 capsules (2-4 mg total) by mouth at bedtime as needed for muscle spasms.    No facility-administered encounter medications on file as of 08/01/2021.    Patient Active Problem List   Diagnosis Date Noted   Weakness of both lower extremities 06/21/2020   Hypertensive retinopathy of both eyes 12/10/2017   Polyuria 04/01/2015   Breast fibroadenoma 03/27/2015   Diabetes mellitus type 2 with neurological manifestations ([place])  03/27/2015   Edema 03/27/2015   HLD (hyperlipidemia) 03/27/2015   Hypertension 03/27/2015   Hypothyroid 03/27/2015   Lactose intolerance 03/27/2015   Lumbar disc disease 03/27/2015   Nocturia 03/27/2015   Osteoarthritis 03/27/2015   Lumbar canal stenosis 03/27/2015   B12 deficiency 03/27/2015   Arthritis, degenerative 12/31/2014   Enthesopathy of hip 12/31/2014   Dizziness and giddiness 12/31/2014   Calcification of intervertebral cartilage or disc of lumbar region 12/31/2014   Benign breast neoplasm 12/31/2014   DDD (degenerative disc disease), lumbar 03/27/2014   Neuritis or radiculitis due to rupture of lumbar intervertebral disc 03/26/2014   Patient Care Plan: Fall Risk (Adult)     Problem Identified: Fall Risk      Long-Range Goal: Absence of Fall and Fall-Related Injury   Start Date: 04/16/2021  Expected End Date: 07/16/2021  Priority: High  Note:   Current Barriers:  High Risk for Falls r/t unsteady gait r/t chronic joint and back pain in patient with DM, Dementia and HTN.  Clinical Goal(s):  Over the next 120 days, patient will not experience falls or require emergent care d/t fall related injuries  Interventions:  Collaboration with Caryn Section Kirstie Peri, MD regarding development and update of comprehensive plan of care as evidenced by provider attestation and co-signature Inter-disciplinary care team collaboration (see longitudinal plan of care) Reviewed safety and fall prevention. Discussed with patient's daughter/caregiver [name]. Mrs. Harkins status has not improved. She is able to complete ADL's with minimal assistance but will require assistance in the home. Discussed previous plan for palliative care vs Hospice. [name] reports not receiving outreach from Palliative Care. Received call back from [place]. The team will gladly assist however expressed concerns that she may not be eligible d/t diagnosis. Discussed plan for virtual visit with  PCP on 07/21/21. Will update the [facility] team if patient meets criteria.    Self-Care Deficits/Patient Goals:  Ensure patient utilizes assistive device appropriately with all ambulation Ensure pathways are clear and well lit Ensure patient wears secure fitting, skid free footwear at all times when ambulating Consider options for in-home assistance and Adult Day Programs Notify provider or care management team if functional status changes or declines     Patient Care Plan: Diabetes Type 2 (Adult)     Problem Identified: Glycemic Management (Diabetes, Type 2)      Long-Range Goal: Glycemic Management Optimized Completed 07/16/2021  Start Date: 11/25/2020  Expected End Date: 03/14/2021  Priority: High  Note:    Lab Results  Component Value Date   HGBA1C 6.5 (A) 05/05/2021    Current Barriers:  Chronic Disease Management support and educational needs r/t Diabetes management  Case Manager Clinical Goal(s):  Over the next 120 days, patient will demonstrate improved adherence to prescribed treatment plan for Diabetes management as evidenced by taking medications as prescribed, monitoring and recording of CBG, and adherence to an ADA/  carb modified diet.  Interventions:  Collaboration with Birdie Sons, MD regarding development and update of comprehensive plan of care as evidenced by provider attestation and co-signature Inter-disciplinary care team collaboration (see longitudinal plan of care) Reviewed medications and compliance with treatment plan. [name] continues to prepares medications. Reports Mrs. [place] continues to refuse blood glucose monitoring. Reviewed s/sx of hypoglycemia and hyperglycemia.  As of August, patient was at A1C goal of less than 7%. Unable to determine if readings have been within range d/t patient refusing blood glucose monitoring. [name] will continue to monitor for symptoms and update the team if additional assistance is required.  Patient  Goals/Self-Care Activities: Caregiver will: Ensure patient takes medications as prescribed Ensure patient attends all scheduled provider appointments Encourage patient to monitor blood glucose levels consistently and utilize recommended interventions Adhere to prescribed ADA/carb modified diet when preparing meals Notify provider or care management team with questions and new concerns as needed      PLAN A member of the care management team will follow up with Mrs. Mahrt daughter [name] within the next two weeks.   Cristy Friedlander Health/THN Care Management [facility] [phone]

## 2021-08-04 ENCOUNTER — Encounter: Payer: Self-pay | Admitting: Family Medicine

## 2021-08-04 ENCOUNTER — Other Ambulatory Visit: Payer: Self-pay

## 2021-08-04 ENCOUNTER — Other Ambulatory Visit: Payer: Medicare HMO | Admitting: Student

## 2021-08-04 DIAGNOSIS — Z515 Encounter for palliative care: Secondary | ICD-10-CM | POA: Diagnosis not present

## 2021-08-04 DIAGNOSIS — R531 Weakness: Secondary | ICD-10-CM | POA: Diagnosis not present

## 2021-08-04 DIAGNOSIS — F0390 Unspecified dementia without behavioral disturbance: Secondary | ICD-10-CM

## 2021-08-04 DIAGNOSIS — R52 Pain, unspecified: Secondary | ICD-10-CM | POA: Diagnosis not present

## 2021-08-04 NOTE — Progress Notes (Signed)
[facility] Palliative Care Consult Note Telephone: [phone]  Fax: [phone]   Date of encounter: 08/04/21 2:13 PM PATIENT NAME: [name] [name] [place] [address]   [phone] (home)  DOB: June 20, 1933 MRN: [id number] PRIMARY CARE PROVIDER:    Birdie Sons, MD,  [address] [place] [address] [phone]  REFERRING PROVIDER:   Birdie Sons, [place],   [address] [phone]  RESPONSIBLE PARTY:    Contact Information     Name Relation Home Work Mobile   [name],Teresa Daughter [phone]  [phone]        I met face to face with patient and family in the home. Palliative Care was asked to follow this patient by consultation request of  Fisher, Kirstie Peri, MD to address advance care planning and complex medical decision making. This is the initial visit.                                     ASSESSMENT AND PLAN / RECOMMENDATIONS:   Advance Care Planning/Goals of Care: Goals include to maximize quality of life and symptom management. Patient/health care surrogate gave his/her permission to discuss.Our advance care planning conversation included a discussion about:    The value and importance of advance care planning  Experiences with loved ones who have been seriously ill or have died  Exploration of personal, cultural or spiritual beliefs that might influence medical decisions  Exploration of goals of care in the event of a sudden injury or illness  Daughter has HCPOA Introduced and reviewed MOST form; family to review and will complete on next visit. CODE STATUS: Full Code  Education provided on Palliative vs. Hospice services. Palliative Medicine will continue to provide supportive care.   Symptom Management/Plan:  Dementia-patient requires cueing and redirection, assistance with adl's. Family to continue providing supportive care. Monitor for  falls/safety. Daughter is seeking additional support in the home; will refer to Palliative SW for recommendations.   Generalized weakness- secondary to her dementia, DDD, OA. Recommend using rollator walker for ambulation; patient declines PT referral at this time. Monitor for falls/safety.   Pain-due to OA, DDD-continue gabapentin, duloxetine, PRN xanaflex as directed. Monitor for worsening pain.   Follow up Palliative Care Visit: Palliative care will continue to follow for complex medical decision making, advance care planning, and clarification of goals. Return in 4 weeks or prn.  I spent 60 minutes providing this consultation. More than 50% of the time in this consultation was spent in counseling and care coordination.   PPS: 50%  HOSPICE ELIGIBILITY/DIAGNOSIS: TBD  Chief Complaint: Palliative Medicine initial consult.   HISTORY OF PRESENT ILLNESS:  [name] Bayon is a 87 y.o. year old female  with dementia, frequent falls, weakness, urinary incontinence, T2DM, hypothyroidism, DDD, osteoarthritis.    Patient resides at home with her husband and daughter's family. She is needing more assistance in the home; 2 falls last month. No injuries reported with recent falls. Endorses pain to bilateral legs, but improved in the past week with changes in her gabapentin routine. No swelling to legs, no nausea; endorses constipation; takes senna with relief. Able to bath self, more difficulty ambulating. Last received therapy about 2 years ago; declines at present as she does not feel it was helpful. Appetite is good; no weight loss noted. Taking PO medications for her diabetes;  rarely checks blood sugars in the home. No recent infections, ED visits or hospitalizations. 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 Husband was in the [employer].    History obtained from review of EMR, discussion with primary team, and interview with family, facility staff/caregiver and/or Ms. Johannes.   I reviewed available labs, medications, imaging, studies and related documents from the EMR.  Records reviewed and summarized above.   ROS  General: NAD EYES: denies vision changes ENMT: denies dysphagia Cardiovascular: denies chest pain, denies DOE Pulmonary: denies cough, denies increased SOB Abdomen: endorses good appetite, + constipation, endorses continence of bowel GU: denies dysuria, endorses incontinence of urine MSK:  weakness Skin: denies rashes or wounds Neurological: denies pain, denies insomnia Psych: Endorses positive mood Heme/lymph/immuno: denies bruises, abnormal bleeding  Physical Exam:  Pulse 76, resp 16, b/p 160/74, sats 93% on room air.  Constitutional: NAD General: frail appearing EYES: anicteric sclera, lids intact, no discharge  ENMT: intact hearing, oral mucous membranes moist, dentition intact CV: S1S2, RRR, no LE edema Pulmonary: LCTA, no increased work of breathing, no cough, room air Abdomen: normo-active BS + 4 quadrants, soft and non tender GU: deferred MSK: moves all extremities, ambulatory with rollator Skin: warm and dry, no rashes or wounds on visible skin Neuro: generalized weakness, A & O x 2 Psych: non-anxious affect, pleasant Hem/lymph/immuno: no widespread bruising CURRENT PROBLEM LIST:  Patient Active Problem List   Diagnosis Date Noted   Weakness of both lower extremities 06/21/2020   Hypertensive retinopathy of both eyes 12/10/2017   Polyuria 04/01/2015   Breast fibroadenoma 03/27/2015   Diabetes mellitus type 2 with neurological manifestations ([facility]) 03/27/2015   Edema 03/27/2015   HLD (hyperlipidemia) 03/27/2015   Hypertension 03/27/2015   Hypothyroid 03/27/2015   Lactose intolerance 03/27/2015   Lumbar disc disease 03/27/2015   Nocturia 03/27/2015   Osteoarthritis 03/27/2015   Lumbar canal stenosis 03/27/2015   B12 deficiency 03/27/2015   Arthritis, degenerative 12/31/2014   Enthesopathy of hip 12/31/2014   Dizziness  and giddiness 12/31/2014   Calcification of intervertebral cartilage or disc of lumbar region 12/31/2014   Benign breast neoplasm 12/31/2014   DDD (degenerative disc disease), lumbar 03/27/2014   Neuritis or radiculitis due to rupture of lumbar intervertebral disc 03/26/2014   PAST MEDICAL HISTORY:  Active Ambulatory Problems    Diagnosis Date Noted   Breast fibroadenoma 03/27/2015   Diabetes mellitus type 2 with neurological manifestations ([facility]) 03/27/2015   Edema 03/27/2015   HLD (hyperlipidemia) 03/27/2015   Hypertension 03/27/2015   Hypothyroid 03/27/2015   Lactose intolerance 03/27/2015   Lumbar disc disease 03/27/2015   Nocturia 03/27/2015   Osteoarthritis 03/27/2015   Lumbar canal stenosis 03/27/2015   B12 deficiency 03/27/2015   Polyuria 04/01/2015   Arthritis, degenerative 12/31/2014   Neuritis or radiculitis due to rupture of lumbar intervertebral disc 03/26/2014   Enthesopathy of hip 12/31/2014   DDD (degenerative disc disease), lumbar 03/27/2014   Dizziness and giddiness 12/31/2014   Calcification of intervertebral cartilage or disc of lumbar region 12/31/2014   Benign breast neoplasm 12/31/2014   Hypertensive retinopathy of both eyes 12/10/2017   Weakness of both lower extremities 06/21/2020   Resolved Ambulatory Problems    Diagnosis Date Noted   Bursitis of hip 03/27/2015   Subcoracoid bursitis 03/27/2015   Past Medical History:  Diagnosis Date   Allergy    Arthritis    Cancer ([facility])    Cataract    Diabetes mellitus without complication (  [facility])    Hyperlipidemia    Thyroid disease    SOCIAL HX:  Social History   Tobacco Use   Smoking status: Never   Smokeless tobacco: Never  Substance Use Topics   Alcohol use: No    Alcohol/week: 0.0 standard drinks   FAMILY HX:  Family History  Problem Relation Age of Onset   Cancer Daughter        breast   Diabetes Son      ALLERGIES:  Allergies  Allergen Reactions   Aricept [Donepezil]      Irritability    Lovastatin     Weakness   Pravastatin Sodium     Muscle weakness   Simvastatin     Fatigue     PERTINENT MEDICATIONS:  Outpatient Encounter Medications as of 08/04/2021  Medication Sig   Accu-Chek Softclix Lancets lancets USE TO CHECK BLOOD SUGAR ONCE A DAY   Alcohol Swabs (DROPSAFE ALCOHOL PREP) 70 % PADS USE TO TEST BLOOD SUGAR EVERY DAY   ALPRAZolam (XANAX) 0.25 MG tablet Take 1 tablet (0.25 mg total) by mouth every 6 (six) hours as needed for anxiety.   amLODipine (NORVASC) 2.5 MG tablet Take 1 tablet (2.5 mg total) by mouth daily.   ascorbic acid (VITAMIN C) 500 MG tablet Take 500 mg by mouth daily.   aspirin 81 MG tablet Take 1 tablet by mouth daily.   Blood Glucose Monitoring Suppl (ACCU-CHEK AVIVA PLUS) w/Device KIT USE TO CHECK BLOOD SUGAR ONCE A DAY   Blood Pressure Monitoring (BLOOD PRESSURE MONITOR/ARM) DEVI Use to check blood pressure of upper arm daily as needed for hypertension (I10)   DULoxetine (CYMBALTA) 30 MG capsule TAKE 1 CAPSULE BY MOUTH EVERY DAY   ezetimibe (ZETIA) 10 MG tablet TAKE 1 TABLET (10 MG TOTAL) BY MOUTH DAILY.   fluticasone (FLONASE) 50 MCG/ACT nasal spray PLACE 2 SPRAYS INTO BOTH NOSTRILS DAILY.   furosemide (LASIX) 20 MG tablet TAKE 1 TABLET BY MOUTH EVERY DAY   gabapentin (NEURONTIN) 100 MG capsule TAKE 1 CAPSULE EVERY MORNING   gabapentin (NEURONTIN) 300 MG capsule Take 1 capsule (300 mg total) by mouth at bedtime. In addition to the $Remo'100mg'GQDQI$  capsule in the morning   glipiZIDE (GLUCOTROL XL) 10 MG 24 hr tablet TAKE 1 TABLET EVERY DAY   glucose blood (ACCU-CHEK AVIVA PLUS) test strip TEST BLOOD SUGAR ONE TIME DAILY   levothyroxine (SYNTHROID) 75 MCG tablet TAKE 1 TABLET EVERY DAY   metFORMIN (GLUCOPHAGE-XR) 750 MG 24 hr tablet TAKE 1 TABLET BY MOUTH 2 TIMES DAILY.   Omega-3 Fatty Acids (FISH OIL) 1000 MG CAPS Take 1 capsule by mouth daily. (Patient not taking: Reported on 11/05/2020)   oxybutynin (DITROPAN) 5 MG tablet TAKE 1 TABLET 2 TO  3 TIMES DAILY AS NEEDED FOR  URINARY FREQUENCY   quinapril (ACCUPRIL) 40 MG tablet TAKE 2 TABLETS BY MOUTH EVERY DAY   tizanidine (ZANAFLEX) 2 MG capsule Take 1-2 capsules (2-4 mg total) by mouth at bedtime as needed for muscle spasms.   No facility-administered encounter medications on file as of 08/04/2021.   Thank you for the opportunity to participate in the care of Ms. Henkes.  The palliative care team will continue to follow. Please call our office at [phone] if we can be of additional assistance.   Ezekiel Slocumb, NP ,   COVID-19 PATIENT SCREENING TOOL Asked and negative response unless otherwise noted:  Have you had symptoms of covid, tested positive or been in contact with someone with  symptoms/positive test in the past 5-10 days? No

## 2021-08-05 ENCOUNTER — Other Ambulatory Visit: Payer: Self-pay | Admitting: Family Medicine

## 2021-08-05 MED ORDER — CYCLOBENZAPRINE HCL 5 MG PO TABS: 5.0000 mg | ORAL_TABLET | Freq: Three times a day (TID) | ORAL | 3 refills | Status: DC | PRN

## 2021-08-06 ENCOUNTER — Ambulatory Visit: Payer: Self-pay

## 2021-08-06 DIAGNOSIS — E1149 Type 2 diabetes mellitus with other diabetic neurological complication: Secondary | ICD-10-CM

## 2021-08-06 DIAGNOSIS — Z9181 History of falling: Secondary | ICD-10-CM

## 2021-08-06 DIAGNOSIS — R413 Other amnesia: Secondary | ICD-10-CM

## 2021-08-06 NOTE — Chronic Care Management (AMB) (Signed)
Chronic Care Management   CCM RN Visit Note  08/06/2021 Name: [name] [name] MRN: [id number] DOB: May 11, 1933  Subjective: [name] [name] is a 87 y.o. year old female who is a primary care patient of [name] [name], [name] Peri, MD. The care management team was consulted for assistance with disease management and care coordination needs.    Engaged with patient's daughter/caregiver [name] [name] by telephone for follow up visit in response to provider referral for case management and/or care coordination services.   Consent to Services:  The patient was given information about Chronic Care Management services, agreed to services, and gave verbal consent prior to initiation of services.  Please see initial visit note for detailed documentation.   Assessment: Review of patient past medical history, allergies, medications, health status, including review of consultants reports, laboratory and other test data, was performed as part of comprehensive evaluation and provision of chronic care management services.   SDOH (Social Determinants of Health) assessments and interventions performed:   SDOH Interventions    Flowsheet Row Most Recent Value  SDOH Interventions   Food Insecurity Interventions Other (Comment)  [Referral submitted for [facility] Plan  Allergies  Allergen Reactions   Aricept [Donepezil]     Irritability    Lovastatin     Weakness   Pravastatin Sodium     Muscle weakness   Simvastatin     Fatigue    Outpatient Encounter Medications as of 08/06/2021  Medication Sig Note   Accu-Chek Softclix Lancets lancets USE TO CHECK BLOOD SUGAR ONCE A DAY    Alcohol Swabs (DROPSAFE ALCOHOL PREP) 70 % PADS USE TO TEST BLOOD SUGAR EVERY DAY    ALPRAZolam (XANAX) 0.25 MG tablet Take 1 tablet (0.25 mg total) by mouth every 6 (six) hours as needed for anxiety. (Patient not taking: Reported on 08/04/2021)    amLODipine (NORVASC) 2.5 MG tablet Take 1 tablet (2.5  mg total) by mouth daily. (Patient taking differently: Take 5 mg by mouth daily.)    ascorbic acid (VITAMIN C) 500 MG tablet Take 500 mg by mouth daily. (Patient not taking: Reported on 08/04/2021)    aspirin 81 MG tablet Take 1 tablet by mouth daily. 03/27/2015: Received from: [facility]:    Blood Glucose Monitoring Suppl (ACCU-CHEK AVIVA PLUS) w/Device KIT USE TO CHECK BLOOD SUGAR ONCE A DAY    Blood Pressure Monitoring (BLOOD PRESSURE MONITOR/ARM) DEVI Use to check blood pressure of upper arm daily as needed for hypertension (I10)    cyclobenzaprine (FLEXERIL) 5 MG tablet Take 1 tablet (5 mg total) by mouth 3 (three) times daily as needed for muscle spasms. Take in place of tizanidine    DULoxetine (CYMBALTA) 30 MG capsule TAKE 1 CAPSULE BY MOUTH EVERY DAY    ezetimibe (ZETIA) 10 MG tablet TAKE 1 TABLET (10 MG TOTAL) BY MOUTH DAILY.    fluticasone (FLONASE) 50 MCG/ACT nasal spray PLACE 2 SPRAYS INTO BOTH NOSTRILS DAILY.    furosemide (LASIX) 20 MG tablet TAKE 1 TABLET BY MOUTH EVERY DAY    gabapentin (NEURONTIN) 100 MG capsule TAKE 1 CAPSULE EVERY MORNING    gabapentin (NEURONTIN) 300 MG capsule Take 1 capsule (300 mg total) by mouth at bedtime. In addition to the 164m capsule in the morning    glipiZIDE (GLUCOTROL XL) 10 MG 24 hr tablet TAKE 1 TABLET EVERY DAY    glucose blood (ACCU-CHEK AVIVA PLUS) test strip TEST BLOOD SUGAR  ONE TIME DAILY    levothyroxine (SYNTHROID) 75 MCG tablet TAKE 1 TABLET BY MOUTH EVERY DAY    metFORMIN (GLUCOPHAGE-XR) 750 MG 24 hr tablet TAKE 1 TABLET BY MOUTH 2 TIMES DAILY.    Multiple Vitamins-Minerals (DAILY MULTIVITAMIN PO) Take 1 tablet by mouth.    Omega-3 Fatty Acids (FISH OIL) 1000 MG CAPS Take 1 capsule by mouth daily. (Patient not taking: Reported on 11/05/2020) 03/27/2015: Received from: [facility]: Take by mouth.   oxybutynin (DITROPAN) 5 MG tablet TAKE 1 TABLET 2 TO 3 TIMES DAILY AS NEEDED FOR   URINARY FREQUENCY    quinapril (ACCUPRIL) 40 MG tablet TAKE 2 TABLETS BY MOUTH EVERY DAY    tizanidine (ZANAFLEX) 2 MG capsule Take 1-2 capsules (2-4 mg total) by mouth at bedtime as needed for muscle spasms.    No facility-administered encounter medications on file as of 08/06/2021.    Patient Active Problem List   Diagnosis Date Noted   Weakness of both lower extremities 06/21/2020   Hypertensive retinopathy of both eyes 12/10/2017   Polyuria 04/01/2015   Breast fibroadenoma 03/27/2015   Diabetes mellitus type 2 with neurological manifestations ([facility]) 03/27/2015   Edema 03/27/2015   HLD (hyperlipidemia) 03/27/2015   Hypertension 03/27/2015   Hypothyroid 03/27/2015   Lactose intolerance 03/27/2015   Lumbar disc disease 03/27/2015   Nocturia 03/27/2015   Osteoarthritis 03/27/2015   Lumbar canal stenosis 03/27/2015   B12 deficiency 03/27/2015   Arthritis, degenerative 12/31/2014   Enthesopathy of hip 12/31/2014   Dizziness and giddiness 12/31/2014   Calcification of intervertebral cartilage or disc of lumbar region 12/31/2014   Benign breast neoplasm 12/31/2014   DDD (degenerative disc disease), lumbar 03/27/2014   Neuritis or radiculitis due to rupture of lumbar intervertebral disc 03/26/2014    Patient Care Plan: Fall Risk (Adult)     Problem Identified: Fall Risk      Long-Range Goal: Absence of Fall and Fall-Related Injury   Start Date: 04/16/2021  Expected End Date: 07/16/2021  Priority: High  Note:   Current Barriers:  High Risk for Falls r/t unsteady gait r/t chronic joint and back pain in patient with DM, Dementia and HTN.  Clinical Goal(s):  Over the next 120 days, patient will not experience falls or require emergent care d/t fall related injuries  Interventions:  Collaboration with [name] [name] [name] Peri, MD regarding development and update of comprehensive plan of care as evidenced by provider attestation and co-signature Inter-disciplinary care team collaboration  (see longitudinal plan of care) Reviewed safety and fall prevention. Discussed with patient's daughter/caregiver [name] [name]. [name] [name] status has not improved. She is able to complete ADL's with minimal assistance but will require assistance in the home. Discussed recent referral for Palliative Care. Patient did not qualify for Hospice services. Per [name] [name], the initial in-home evaluation was complete. Anticipates follow up with the Palliative Care team next month. Discussed financial strain r/t nutritional resources. Mrs. [place]i does not drive, is unable to stand for prolonged periods, and is unable to prepare meals. [name] [name] is agreeable to discussing available nutrition resources/food card. Referral submitted to the [facility] team.   Self-Care Deficits/Patient Goals:  Ensure patient utilizes assistive device appropriately with all ambulation Ensure pathways are clear and well lit Ensure patient wears secure fitting, skid free footwear at all times when ambulating Consider options for in-home assistance and Adult Day Programs Notify provider or care management team if functional status changes or declines     Patient Care  Plan: Diabetes Type 2 (Adult)     Problem Identified: Glycemic Management (Diabetes, Type 2)      Long-Range Goal: Glycemic Management Optimized   Start Date: 11/25/2020  Expected End Date: 03/14/2021  Priority: High  Note:    Lab Results  Component Value Date   HGBA1C 6.5 (A) 05/05/2021    Current Barriers:  Chronic Disease Management support and educational needs r/t Diabetes management  Case Manager Clinical Goal(s):  Over the next 120 days, patient will demonstrate improved adherence to prescribed treatment plan for Diabetes management as evidenced by taking medications as prescribed, monitoring and recording of CBG, and adherence to an ADA/ carb modified diet.  Interventions:  Collaboration with Birdie Sons, MD regarding development  and update of comprehensive plan of care as evidenced by provider attestation and co-signature Inter-disciplinary care team collaboration (see longitudinal plan of care) Reviewed medications and compliance with treatment plan. [name] [name] continues to prepares medications. Reports Mrs. Critzer continues to refuse blood glucose monitoring. Reviewed s/sx of hypoglycemia and hyperglycemia.  As of August, patient was at A1C goal of less than 7%. Unable to determine if readings have been within range d/t patient refusing blood glucose monitoring. [name] [name] will continue to monitor for symptoms and update the team if additional assistance is required.  Patient Goals/Self-Care Activities: Caregiver will: Ensure patient takes medications as prescribed Ensure patient attends all scheduled provider appointments Encourage patient to monitor blood glucose levels consistently and utilize recommended interventions Adhere to prescribed ADA/carb modified diet when preparing meals Notify provider or care management team with questions and new concerns as needed       PLAN Will submit referral for to the [facility] regarding nutritional resources A member of the care management team will follow up next month.   Cristy [facility] Care Management [facility] [phone]

## 2021-08-13 DIAGNOSIS — E1149 Type 2 diabetes mellitus with other diabetic neurological complication: Secondary | ICD-10-CM

## 2021-08-14 ENCOUNTER — Ambulatory Visit (INDEPENDENT_AMBULATORY_CARE_PROVIDER_SITE_OTHER): Payer: Medicare HMO

## 2021-08-14 DIAGNOSIS — I10 Essential (primary) hypertension: Secondary | ICD-10-CM

## 2021-08-14 DIAGNOSIS — R413 Other amnesia: Secondary | ICD-10-CM

## 2021-08-14 DIAGNOSIS — Z9181 History of falling: Secondary | ICD-10-CM

## 2021-08-15 NOTE — Chronic Care Management (AMB) (Signed)
Chronic Care Management   CCM RN Visit Note   Name: [name] [name] MRN: [id number] DOB: [birth year]  Subjective: [name] [name] is a 87 y.o. year old female who is a primary care patient of Caryn Section, Kirstie Peri, MD. The care management team was consulted for assistance with disease management and care coordination needs.    Engaged with patient's daughter/caregiver by telephone for follow up visit in response to provider referral for case management and care coordination services.   Consent to Services:  The patient was given information about Chronic Care Management services, agreed to services, and gave verbal consent prior to initiation of services.  Please see initial visit note for detailed documentation.    Assessment: Review of patient past medical history, allergies, medications, health status, including review of consultants reports, laboratory and other test data, was performed as part of comprehensive evaluation and provision of chronic care management services.   SDOH (Social Determinants of Health) assessments and interventions performed: No   CCM Care Plan  Allergies  Allergen Reactions   Aricept [Donepezil]     Irritability    Lovastatin     Weakness   Pravastatin Sodium     Muscle weakness   Simvastatin     Fatigue    Outpatient Encounter Medications as of 08/14/2021  Medication Sig Note   Accu-Chek Softclix Lancets lancets USE TO CHECK BLOOD SUGAR ONCE A DAY    Alcohol Swabs (DROPSAFE ALCOHOL PREP) 70 % PADS USE TO TEST BLOOD SUGAR EVERY DAY    ALPRAZolam (XANAX) 0.25 MG tablet Take 1 tablet (0.25 mg total) by mouth every 6 (six) hours as needed for anxiety. (Patient not taking: Reported on 08/04/2021)    amLODipine (NORVASC) 2.5 MG tablet Take 1 tablet (2.5 mg total) by mouth daily. (Patient taking differently: Take 5 mg by mouth daily.)    ascorbic acid (VITAMIN C) 500 MG tablet Take 500 mg by mouth daily. (Patient not taking: Reported on 08/04/2021)     aspirin 81 MG tablet Take 1 tablet by mouth daily. 03/27/2015: Received from: [place]:    Blood Glucose Monitoring Suppl (ACCU-CHEK AVIVA PLUS) w/Device KIT USE TO CHECK BLOOD SUGAR ONCE A DAY    Blood Pressure Monitoring (BLOOD PRESSURE MONITOR/ARM) DEVI Use to check blood pressure of upper arm daily as needed for hypertension (I10)    cyclobenzaprine (FLEXERIL) 5 MG tablet Take 1 tablet (5 mg total) by mouth 3 (three) times daily as needed for muscle spasms. Take in place of tizanidine    DULoxetine (CYMBALTA) 30 MG capsule TAKE 1 CAPSULE BY MOUTH EVERY DAY    ezetimibe (ZETIA) 10 MG tablet TAKE 1 TABLET (10 MG TOTAL) BY MOUTH DAILY.    fluticasone (FLONASE) 50 MCG/ACT nasal spray PLACE 2 SPRAYS INTO BOTH NOSTRILS DAILY.    furosemide (LASIX) 20 MG tablet TAKE 1 TABLET BY MOUTH EVERY DAY    gabapentin (NEURONTIN) 100 MG capsule TAKE 1 CAPSULE EVERY MORNING    gabapentin (NEURONTIN) 300 MG capsule Take 1 capsule (300 mg total) by mouth at bedtime. In addition to the 154m capsule in the morning    glipiZIDE (GLUCOTROL XL) 10 MG 24 hr tablet TAKE 1 TABLET EVERY DAY    glucose blood (ACCU-CHEK AVIVA PLUS) test strip TEST BLOOD SUGAR ONE TIME DAILY    levothyroxine (SYNTHROID) 75 MCG tablet TAKE 1 TABLET BY MOUTH EVERY DAY    metFORMIN (GLUCOPHAGE-XR) 750 MG 24 hr tablet TAKE 1 TABLET BY MOUTH 2  TIMES DAILY.    Multiple Vitamins-Minerals (DAILY MULTIVITAMIN PO) Take 1 tablet by mouth.    Omega-3 Fatty Acids (FISH OIL) 1000 MG CAPS Take 1 capsule by mouth daily. (Patient not taking: Reported on 11/05/2020) 03/27/2015: Received from: [place]: Take by mouth.   oxybutynin (DITROPAN) 5 MG tablet TAKE 1 TABLET 2 TO 3 TIMES DAILY AS NEEDED FOR  URINARY FREQUENCY    quinapril (ACCUPRIL) 40 MG tablet TAKE 2 TABLETS BY MOUTH EVERY DAY    tizanidine (ZANAFLEX) 2 MG capsule Take 1-2 capsules (2-4 mg total) by mouth at bedtime as needed for muscle  spasms.    No facility-administered encounter medications on file as of 08/14/2021.    Patient Active Problem List   Diagnosis Date Noted   Weakness of both lower extremities 06/21/2020   Hypertensive retinopathy of both eyes 12/10/2017   Polyuria 04/01/2015   Breast fibroadenoma 03/27/2015   Diabetes mellitus type 2 with neurological manifestations ([place]) 03/27/2015   Edema 03/27/2015   HLD (hyperlipidemia) 03/27/2015   Hypertension 03/27/2015   Hypothyroid 03/27/2015   Lactose intolerance 03/27/2015   Lumbar disc disease 03/27/2015   Nocturia 03/27/2015   Osteoarthritis 03/27/2015   Lumbar canal stenosis 03/27/2015   B12 deficiency 03/27/2015   Arthritis, degenerative 12/31/2014   Enthesopathy of hip 12/31/2014   Dizziness and giddiness 12/31/2014   Calcification of intervertebral cartilage or disc of lumbar region 12/31/2014   Benign breast neoplasm 12/31/2014   DDD (degenerative disc disease), lumbar 03/27/2014   Neuritis or radiculitis due to rupture of lumbar intervertebral disc 03/26/2014     Patient Care Plan: RN Care Management Plan of Care     Problem Identified: Disease Progression (Hypertension)      Long-Range Goal: Disease Progression Prevented or Minimized   Start Date: 08/06/2021  Expected End Date: 11/04/2021  Priority: High  Note:   Current Barriers:  Chronic Disease Management support and education needs related to HLD, DMII, Fall Risk and Memory Change.  RNCM Clinical Goal(s):  Patient will continue to work with RN Care Manager and the care management team to address concerns related to disease management and community resource needs.   Interventions: 1:1 collaboration with primary care provider regarding development and update of comprehensive plan of care as evidenced by provider attestation and co-signature Inter-disciplinary care team collaboration (see longitudinal plan of care) Evaluation of current treatment plan related to  self management  and patient's adherence to plan as established by provider   Diabetes Interventions:  (Status:  Condition stable.  Not addressed this visit.) Long Term Goal Assessed patient's understanding of A1c goal: <7%  Lab Results  Component Value Date   HGBA1C 6.5 (A) 05/05/2021   Falls Interventions:  (Status:  Goal on track:  Yes.) Long Term Goal Reviewed information regarding safety and fall prevention. Per caregiver, patient's balance/gait remains unsteady. Denies recent falls. She continues to monitor closely and follow recommended precautions. Discussed ability to perform ADL's and tasks in the home. Patient has very good support from her caregiver/daughter but functional status has declined. Requires minimal assistance with ADL's. Requires assistance with IADL's. Daughter currently assisting and serving as the primary caregiver. Referral was previously submitted for nutritional assistance. Pending outreach with the [employer]. Discussed need for additional assistance in the home. Mrs. Wolters is very reluctant to agree to members assisting in the home. She has agreed to routine visits from the Palliative Care team. The Palliative Care team will follow up again later this  month. Current plan is to maintain safety and as much independence as possible in the home. Her daughter/caregiver agreed to keep the care management team updated of in-home needs. Will contact the team with changes regarding goals of care.  Patient Goals/Self-Care Activities: Take all medications as prescribed Follow recommended safety and fall prevention measures Continue to work with the [facility] team  Call provider office for new concerns or questions  Continue to work with the the care management team to address care coordination needs and goals of care  Follow Up Plan:   Will follow up later this month     PLAN A member of the care management team will follow up later this month.   [facility] Care [facility] [phone]

## 2021-08-18 ENCOUNTER — Telehealth: Payer: Self-pay

## 2021-08-18 NOTE — Telephone Encounter (Signed)
08/18/21 @ 1:55 PM: PC SW outreached patiens daughter to discuss and review additional caregiver needs and resources.   Call unsuccessful. SW unable to LVM. Will attempt to outreach again at later date/time.

## 2021-08-21 ENCOUNTER — Other Ambulatory Visit: Payer: Self-pay | Admitting: Family Medicine

## 2021-08-21 DIAGNOSIS — E1149 Type 2 diabetes mellitus with other diabetic neurological complication: Secondary | ICD-10-CM

## 2021-08-21 NOTE — Telephone Encounter (Signed)
Requested Prescriptions  Pending Prescriptions Disp Refills  . gabapentin (NEURONTIN) 300 MG capsule [Pharmacy Med Name: GABAPENTIN 300 MG Capsule] 90 capsule 2    Sig: TAKE 1 CAPSULE BY MOUTH AT BEDTIME. IN ADDITION TO THE 100MG  CAPSULE IN THE MORNING     Neurology: Anticonvulsants - gabapentin Passed - 08/21/2021  3:09 PM      Passed - Valid encounter within last 12 months    Recent Outpatient Visits          1 month ago Memory change   [place], Kirstie Peri, MD   1 month ago No-show for appointment   [facility] Birdie Sons, MD   3 months ago Leg cramps   [facility] Birdie Sons, MD   3 months ago Primary hypertension   [place], Vickki Muff, PA-C   9 months ago Primary hypertension   [facility] Birdie Sons, MD             . oxybutynin (DITROPAN) 5 MG tablet [Pharmacy Med Name: OXYBUTYNIN CHLORIDE 5 MG Tablet] 270 tablet 1    Sig: TAKE 1 TABLET 2 TO 3 TIMES DAILY AS NEEDED FOR  URINARY FREQUENCY     Urology:  Bladder Agents Passed - 08/21/2021  3:09 PM      Passed - Valid encounter within last 12 months    Recent Outpatient Visits          1 month ago Memory change   [facility] Birdie Sons, MD   1 month ago No-show for appointment   [facility] Birdie Sons, MD   3 months ago Leg cramps   [facility] Birdie Sons, MD   3 months ago Primary hypertension   [facility] [place], Vickki Muff, PA-C   9 months ago Primary hypertension   [facility] Birdie Sons, MD             . glipiZIDE (GLUCOTROL XL) 10 MG 24 hr tablet [Pharmacy Med Name: GLIPIZIDE ER 10 MG Tablet Extended Release 24 Hour] 90 tablet 0    Sig: TAKE 1 [place]     Endocrinology:  Diabetes - Sulfonylureas Passed - 08/21/2021  3:09 PM      Passed - HBA1C is between 0 and 7.9 and within 180 days     Hemoglobin A1C  Date Value Ref Range Status  05/05/2021 6.5 (A) 4.0 - 5.6 % Final   Hgb A1c MFr Bld  Date Value Ref Range Status  11/05/2020 6.6 (H) 4.8 - 5.6 % Final    Comment:             Prediabetes: 5.7 - 6.4          Diabetes: >6.4          Glycemic control for adults with diabetes: <7.0          Passed - Valid encounter within last 6 months    Recent Outpatient Visits          1 month ago Memory change   [facility] Birdie Sons, MD   1 month ago No-show for appointment   [place], MD   3 months ago Leg cramps   [facility] Birdie Sons, MD   3 months ago Primary hypertension   [place], Vickki Muff, PA-C   9 months ago Primary  hypertension   [facility] Birdie Sons, MD             . gabapentin (NEURONTIN) 100 MG capsule [Pharmacy Med Name: GABAPENTIN 100 MG Capsule] 90 capsule 2    Sig: TAKE 1 CAPSULE EVERY MORNING     Neurology: Anticonvulsants - gabapentin Passed - 08/21/2021  3:09 PM      Passed - Valid encounter within last 12 months    Recent Outpatient Visits          1 month ago Memory change   [facility] Birdie Sons, MD   1 month ago No-show for appointment   [facility] Birdie Sons, MD   3 months ago Leg cramps   [facility] Birdie Sons, MD   3 months ago Primary hypertension   [place], Vickki Muff, PA-C   9 months ago Primary hypertension   [facility] Birdie Sons, MD             . amLODipine (NORVASC) 5 MG tablet [Pharmacy Med Name: AMLODIPINE BESYLATE 5 MG Tablet] 90 tablet 0    Sig: TAKE 1 TABLET EVERY DAY     Cardiovascular:  Calcium Channel Blockers Passed - 08/21/2021  3:09 PM      Passed - Last BP in normal range    BP Readings from Last 1 Encounters:  05/05/21 (!) 134/57         Passed - Valid encounter  within last 6 months    Recent Outpatient Visits          1 month ago Memory change   [facility] Birdie Sons, MD   1 month ago No-show for appointment   [facility] Birdie Sons, MD   3 months ago Leg cramps   [facility] Birdie Sons, MD   3 months ago Primary hypertension   [place], Vickki Muff, PA-C   9 months ago Primary hypertension   [facility] Birdie Sons, MD             . furosemide (LASIX) 20 MG tablet [Pharmacy Med Name: FUROSEMIDE 20 MG Tablet] 90 tablet 0    Sig: TAKE 1 TABLET EVERY DAY     Cardiovascular:  Diuretics - Loop Passed - 08/21/2021  3:09 PM      Passed - K in normal range and within 360 days    Potassium  Date Value Ref Range Status  11/05/2020 4.6 3.5 - 5.2 mmol/L Final         Passed - Ca in normal range and within 360 days    Calcium  Date Value Ref Range Status  11/05/2020 10.2 8.7 - 10.3 mg/dL Final         Passed - Na in normal range and within 360 days    Sodium  Date Value Ref Range Status  11/05/2020 139 134 - 144 mmol/L Final         Passed - Cr in normal range and within 360 days    Creat  Date Value Ref Range Status  07/06/2017 0.90 (H) 0.60 - 0.88 mg/dL Final    Comment:    For patients >76 years of age, the reference limit for Creatinine is approximately 13% higher for people identified as African-American. .    Creatinine, Ser  Date Value Ref Range Status  11/05/2020 0.89 0.57 - 1.00 mg/dL Final  Comment:                   **Effective November 11, 2020 [facility] will begin**                  reporting the 2021 CKD-EPI creatinine equation that                  estimates kidney function without a race variable.    Creatinine, POC  Date Value Ref Range Status  02/26/2017 n/a mg/dL Final         Passed - Last BP in normal range    BP Readings from Last 1 Encounters:  05/05/21 (!) 134/57         Passed - Valid  encounter within last 6 months    Recent Outpatient Visits          1 month ago Memory change   [facility] [place], Kirstie Peri, MD   1 month ago No-show for appointment   [facility] Birdie Sons, MD   3 months ago Leg cramps   [facility] Birdie Sons, MD   3 months ago Primary hypertension   [place], Vickki Muff, PA-C   9 months ago Primary hypertension   [facility] Birdie Sons, MD             . DULoxetine (CYMBALTA) 30 MG capsule [Pharmacy Med Name: DULOXETINE HCL 30 MG Capsule Delayed Release Particles] 30 capsule 0    Sig: TAKE [place]     Psychiatry: Antidepressants - SNRI Passed - 08/21/2021  3:09 PM      Passed - Last BP in normal range    BP Readings from Last 1 Encounters:  05/05/21 (!) 134/57         Passed - Valid encounter within last 6 months    Recent Outpatient Visits          1 month ago Memory change   [facility] Birdie Sons, MD   1 month ago No-show for appointment   [facility] Birdie Sons, MD   3 months ago Leg cramps   [facility] Birdie Sons, MD   3 months ago Primary hypertension   [place], Vickki Muff, PA-C   9 months ago Primary hypertension   [place], Kirstie Peri, MD

## 2021-08-21 NOTE — Telephone Encounter (Signed)
Requested medication (s) are due for refill today: NO  Requested medication (s) are on the active medication list: DOSE INCONSISTENT  Last refill:  07/15/21  Future visit scheduled: no, palliative care pt.   Notes to clinic:  Chart notes that pt is still taking 5mg  Norvasc daily even though new dose order is for 2.5mg , please assess.  Requested Prescriptions  Pending Prescriptions Disp Refills   amLODipine (NORVASC) 5 MG tablet [Pharmacy Med Name: AMLODIPINE BESYLATE 5 MG Tablet] 90 tablet 0    Sig: TAKE 1 TABLET EVERY DAY     Cardiovascular:  Calcium Channel Blockers Passed - 08/21/2021  3:09 PM      Passed - Last BP in normal range    BP Readings from Last 1 Encounters:  05/05/21 (!) 134/57          Passed - Valid encounter within last 6 months    Recent Outpatient Visits           1 month ago Memory change   [facility] Birdie Sons, MD   1 month ago No-show for appointment   [facility] Birdie Sons, MD   3 months ago Leg cramps   [facility] Birdie Sons, MD   3 months ago Primary hypertension   [place], Vickki Muff, PA-C   9 months ago Primary hypertension   [place], MD              Signed Prescriptions Disp Refills   gabapentin (NEURONTIN) 300 MG capsule 90 capsule 2    Sig: TAKE 1 CAPSULE BY MOUTH AT BEDTIME. IN ADDITION TO THE 100MG  CAPSULE IN THE MORNING     Neurology: Anticonvulsants - gabapentin Passed - 08/21/2021  3:09 PM      Passed - Valid encounter within last 12 months    Recent Outpatient Visits           1 month ago Memory change   [facility] Birdie Sons, MD   1 month ago No-show for appointment   [facility] Birdie Sons, MD   3 months ago Leg cramps   [facility] Birdie Sons, MD   3 months ago Primary hypertension   [place], Vickki Muff,  PA-C   9 months ago Primary hypertension   [facility] Birdie Sons, MD               oxybutynin (DITROPAN) 5 MG tablet 270 tablet 1    Sig: TAKE 1 TABLET 2 TO 3 TIMES DAILY AS NEEDED FOR  URINARY FREQUENCY     Urology:  Bladder Agents Passed - 08/21/2021  3:09 PM      Passed - Valid encounter within last 12 months    Recent Outpatient Visits           1 month ago Memory change   [facility] Birdie Sons, MD   1 month ago No-show for appointment   [place], MD   3 months ago Leg cramps   [facility] Birdie Sons, MD   3 months ago Primary hypertension   [place], Vickki Muff, PA-C   9 months ago Primary hypertension   [place], MD               glipiZIDE (GLUCOTROL XL) 10 MG 24 hr  tablet 90 tablet 0    Sig: TAKE 1 TABLET EVERY DAY     Endocrinology:  Diabetes - Sulfonylureas Passed - 08/21/2021  3:09 PM      Passed - HBA1C is between 0 and 7.9 and within 180 days    Hemoglobin A1C  Date Value Ref Range Status  05/05/2021 6.5 (A) 4.0 - 5.6 % Final   Hgb A1c MFr Bld  Date Value Ref Range Status  11/05/2020 6.6 (H) 4.8 - 5.6 % Final    Comment:             Prediabetes: 5.7 - 6.4          Diabetes: >6.4          Glycemic control for adults with diabetes: <7.0           Passed - Valid encounter within last 6 months    Recent Outpatient Visits           1 month ago Memory change   [facility] Birdie Sons, MD   1 month ago No-show for appointment   [facility] Birdie Sons, MD   3 months ago Leg cramps   [facility] Birdie Sons, MD   3 months ago Primary hypertension   [employer], Vickki Muff, PA-C   9 months ago Primary hypertension   [place], Kirstie Peri, MD               gabapentin (NEURONTIN)  100 MG capsule 90 capsule 2    Sig: TAKE 1 [place]     Neurology: Anticonvulsants - gabapentin Passed - 08/21/2021  3:09 PM      Passed - Valid encounter within last 12 months    Recent Outpatient Visits           1 month ago Memory change   [facility] Birdie Sons, MD   1 month ago No-show for appointment   [facility] Birdie Sons, MD   3 months ago Leg cramps   [facility] Birdie Sons, MD   3 months ago Primary hypertension   [employer], Vickki Muff, PA-C   9 months ago Primary hypertension   [facility] Birdie Sons, MD               furosemide (LASIX) 20 MG tablet 90 tablet 0    Sig: TAKE 1 TABLET EVERY DAY     Cardiovascular:  Diuretics - Loop Passed - 08/21/2021  3:09 PM      Passed - K in normal range and within 360 days    Potassium  Date Value Ref Range Status  11/05/2020 4.6 3.5 - 5.2 mmol/L Final          Passed - Ca in normal range and within 360 days    Calcium  Date Value Ref Range Status  11/05/2020 10.2 8.7 - 10.3 mg/dL Final          Passed - Na in normal range and within 360 days    Sodium  Date Value Ref Range Status  11/05/2020 139 134 - 144 mmol/L Final          Passed - Cr in normal range and within 360 days    Creat  Date Value Ref Range Status  07/06/2017 0.90 (H) 0.60 - 0.88 mg/dL Final    Comment:    For patients >  7 years of age, the reference limit for Creatinine is approximately 13% higher for people identified as African-American. .    Creatinine, Ser  Date Value Ref Range Status  11/05/2020 0.89 0.57 - 1.00 mg/dL Final    Comment:                   **Effective November 11, 2020 [facility] will begin**                  reporting the 2021 CKD-EPI creatinine equation that                  estimates kidney function without a race variable.    Creatinine, POC  Date Value Ref Range Status  02/26/2017 n/a mg/dL  Final          Passed - Last BP in normal range    BP Readings from Last 1 Encounters:  05/05/21 (!) 134/57          Passed - Valid encounter within last 6 months    Recent Outpatient Visits           1 month ago Memory change   [facility] [place], Kirstie Peri, MD   1 month ago No-show for appointment   [facility] Birdie Sons, MD   3 months ago Leg cramps   [facility] Birdie Sons, MD   3 months ago Primary hypertension   [place], Vickki Muff, PA-C   9 months ago Primary hypertension   [facility] Birdie Sons, MD               DULoxetine (CYMBALTA) 30 MG capsule 30 capsule 0    Sig: TAKE [place]     Psychiatry: Antidepressants - SNRI Passed - 08/21/2021  3:09 PM      Passed - Last BP in normal range    BP Readings from Last 1 Encounters:  05/05/21 (!) 134/57          Passed - Valid encounter within last 6 months    Recent Outpatient Visits           1 month ago Memory change   [facility] Birdie Sons, MD   1 month ago No-show for appointment   [facility] Birdie Sons, MD   3 months ago Leg cramps   [facility] Birdie Sons, MD   3 months ago Primary hypertension   [place], Vickki Muff, PA-C   9 months ago Primary hypertension   [facility] Birdie Sons, MD

## 2021-08-22 ENCOUNTER — Ambulatory Visit: Payer: Self-pay

## 2021-08-22 DIAGNOSIS — R413 Other amnesia: Secondary | ICD-10-CM

## 2021-08-22 DIAGNOSIS — E1149 Type 2 diabetes mellitus with other diabetic neurological complication: Secondary | ICD-10-CM

## 2021-08-22 NOTE — Chronic Care Management (AMB) (Signed)
Chronic Care Management   CCM RN Visit Note  08/22/2021 Name: [name] [name] MRN: [id number] DOB: April 23, 1933  Subjective: [name] [name] is a 87 y.o. year old female who is a primary care patient of Caryn Section, Kirstie Peri, MD. The care management team was consulted for assistance with disease management and care coordination needs.    Engaged with patient's caregiver/daughter by telephone for follow up visit in response to provider referral for case management and care coordination services.   Consent to Services:  The patient was given information about Chronic Care Management services, agreed to services, and gave verbal consent prior to initiation of services.  Please see initial visit note for detailed documentation.   Patient agreed to services and verbal consent obtained.   Assessment: Review of patient past medical history, allergies, medications, health status, including review of consultants reports, laboratory and other test data, was performed as part of comprehensive evaluation and provision of chronic care management services.   SDOH (Social Determinants of Health) assessments and interventions performed: No  CCM Care Plan  Allergies  Allergen Reactions   Aricept [Donepezil]     Irritability    Lovastatin     Weakness   Pravastatin Sodium     Muscle weakness   Simvastatin     Fatigue    Outpatient Encounter Medications as of 08/22/2021  Medication Sig Note   Accu-Chek Softclix Lancets lancets USE TO CHECK BLOOD SUGAR ONCE A DAY    Alcohol Swabs (DROPSAFE ALCOHOL PREP) 70 % PADS USE TO TEST BLOOD SUGAR EVERY DAY    ALPRAZolam (XANAX) 0.25 MG tablet Take 1 tablet (0.25 mg total) by mouth every 6 (six) hours as needed for anxiety. (Patient not taking: Reported on 08/04/2021)    amLODipine (NORVASC) 2.5 MG tablet Take 1 tablet (2.5 mg total) by mouth daily. (Patient taking differently: Take 5 mg by mouth daily.)    amLODipine (NORVASC) 5 MG tablet TAKE 1 TABLET EVERY DAY     ascorbic acid (VITAMIN C) 500 MG tablet Take 500 mg by mouth daily. (Patient not taking: Reported on 08/04/2021)    aspirin 81 MG tablet Take 1 tablet by mouth daily. 03/27/2015: Received from: [facility]:    Blood Glucose Monitoring Suppl (ACCU-CHEK AVIVA PLUS) w/Device KIT USE TO CHECK BLOOD SUGAR ONCE A DAY    Blood Pressure Monitoring (BLOOD PRESSURE MONITOR/ARM) DEVI Use to check blood pressure of upper arm daily as needed for hypertension (I10)    cyclobenzaprine (FLEXERIL) 5 MG tablet Take 1 tablet (5 mg total) by mouth 3 (three) times daily as needed for muscle spasms. Take in place of tizanidine    DULoxetine (CYMBALTA) 30 MG capsule TAKE 1 CAPSULE EVERY DAY    ezetimibe (ZETIA) 10 MG tablet TAKE 1 TABLET (10 MG TOTAL) BY MOUTH DAILY.    fluticasone (FLONASE) 50 MCG/ACT nasal spray PLACE 2 SPRAYS INTO BOTH NOSTRILS DAILY.    furosemide (LASIX) 20 MG tablet TAKE 1 TABLET EVERY DAY    gabapentin (NEURONTIN) 100 MG capsule TAKE 1 CAPSULE EVERY MORNING    gabapentin (NEURONTIN) 300 MG capsule TAKE 1 CAPSULE BY MOUTH AT BEDTIME. IN ADDITION TO THE 100MG CAPSULE IN THE MORNING    glipiZIDE (GLUCOTROL XL) 10 MG 24 hr tablet TAKE 1 TABLET EVERY DAY    glucose blood (ACCU-CHEK AVIVA PLUS) test strip TEST BLOOD SUGAR ONE TIME DAILY    levothyroxine (SYNTHROID) 75 MCG tablet TAKE 1 TABLET BY MOUTH EVERY DAY  metFORMIN (GLUCOPHAGE-XR) 750 MG 24 hr tablet TAKE 1 TABLET BY MOUTH 2 TIMES DAILY.    Multiple Vitamins-Minerals (DAILY MULTIVITAMIN PO) Take 1 tablet by mouth.    Omega-3 Fatty Acids (FISH OIL) 1000 MG CAPS Take 1 capsule by mouth daily. (Patient not taking: Reported on 11/05/2020) 03/27/2015: Received from: [facility]: Take by mouth.   oxybutynin (DITROPAN) 5 MG tablet TAKE 1 TABLET 2 TO 3 TIMES DAILY AS NEEDED FOR  URINARY FREQUENCY    quinapril (ACCUPRIL) 40 MG tablet TAKE 2 TABLETS BY MOUTH EVERY DAY    tizanidine  (ZANAFLEX) 2 MG capsule Take 1-2 capsules (2-4 mg total) by mouth at bedtime as needed for muscle spasms.    No facility-administered encounter medications on file as of 08/22/2021.    Patient Active Problem List   Diagnosis Date Noted   Weakness of both lower extremities 06/21/2020   Hypertensive retinopathy of both eyes 12/10/2017   Polyuria 04/01/2015   Breast fibroadenoma 03/27/2015   Diabetes mellitus type 2 with neurological manifestations ([place]) 03/27/2015   Edema 03/27/2015   HLD (hyperlipidemia) 03/27/2015   Hypertension 03/27/2015   Hypothyroid 03/27/2015   Lactose intolerance 03/27/2015   Lumbar disc disease 03/27/2015   Nocturia 03/27/2015   Osteoarthritis 03/27/2015   Lumbar canal stenosis 03/27/2015   B12 deficiency 03/27/2015   Arthritis, degenerative 12/31/2014   Enthesopathy of hip 12/31/2014   Dizziness and giddiness 12/31/2014   Calcification of intervertebral cartilage or disc of lumbar region 12/31/2014   Benign breast neoplasm 12/31/2014   DDD (degenerative disc disease), lumbar 03/27/2014   Neuritis or radiculitis due to rupture of lumbar intervertebral disc 03/26/2014     Patient Care Plan: RN Care Management Plan of Care     Problem Identified: Disease Progression (Hypertension)      Long-Range Goal: Disease Progression Prevented or Minimized   Start Date: 08/06/2021  Expected End Date: 11/04/2021  Priority: High  Note:   Current Barriers:  Chronic Disease Management support and education needs related to HLD, DMII, Fall Risk and Memory Change.  RNCM Clinical Goal(s):  Patient will continue to work with RN Care Manager and the care management team to address concerns related to disease management and community resource needs.   Interventions: 1:1 collaboration with primary care provider regarding development and update of comprehensive plan of care as evidenced by provider attestation and co-signature Inter-disciplinary care team collaboration  (see longitudinal plan of care) Evaluation of current treatment plan related to  self management and patient's adherence to plan as established by provider   Diabetes Interventions:  (Status:  Condition stable.  Not addressed this visit.) Long Term Goal Assessed patient's understanding of A1c goal: <7%  Lab Results  Component Value Date   HGBA1C 6.5 (A) 05/05/2021   Falls Interventions:  (Status:  Goal on track:  Yes.) Long Term Goal Reviewed information regarding safety and fall prevention. Per caregiver, patient's balance/gait remains unsteady. Denies recent falls. She continues to monitor closely and follow recommended precautions. Discussed ability to perform ADL's and tasks in the home. Patient has very good support from her caregiver/daughter but functional status has declined. Requires minimal assistance with ADL's. Requires assistance with IADL's. Daughter currently assisting and serving as the primary caregiver. Referral was previously submitted for nutritional assistance. Pending outreach with the [employer]. Discussed need for additional assistance in the home. Mrs. Danner is very reluctant to agree to members assisting in the home. She has agreed to routine visits from the  Palliative Care team. The Palliative Care team will follow up again later this month. Current plan is to maintain safety and as much independence as possible in the home. Her daughter/caregiver agreed to keep the care management team updated of in-home needs. Will contact the team with changes regarding goals of care. Update on 08/22/21: Follow up regarding request for nutritional assistance. Per chart review, the referral for assistance has been reviewed. Caregiver informed of message received from the [employer] team regarding slight delays d/t staffing. Agreed to call if Mrs. Yontz requires urgent assistance.  Patient Goals/Self-Care Activities: Take all medications as prescribed Follow  recommended safety and fall prevention measures Continue to work with the [facility] team  Call provider office for new concerns or questions  Continue to work with the the care management team to address care coordination needs and goals of care  Follow Up Plan:   Will follow up later this month     PLAN A member of the care management team will follow up later this month.   Cristy [facility] Care Management [facility] [phone]

## 2021-08-22 NOTE — Patient Instructions (Signed)
Thank you for allowing the Chronic Care Management team to participate in your care. It was great speaking with you today! °

## 2021-08-28 ENCOUNTER — Telehealth: Payer: Self-pay

## 2021-08-28 NOTE — Telephone Encounter (Signed)
° °  Telephone encounter was:  Successful.  08/28/2021 Name: [name] [name] MRN: [id number] DOB: Jun 26, 1933  [name] [name] is a 87 y.o. year old female who is a primary care patient of Fisher, Kirstie Peri, MD . The community resource team was consulted for assistance with [place] guide performed the following interventions: Spoke with patient's daughter Stephens November confirmed her email address and sent food pantry list and instructions to apply online for food stamps per her request. Letter saved in [place].  Follow Up Plan:  Care guide will follow up with patient by phone over the next 5-03 days  Jurnee Nakayama, AAS Paralegal, [facility]  [address], [place] [address] ??[email or web address]   ?? [phone]   [email or web address]

## 2021-08-28 NOTE — Telephone Encounter (Signed)
° °  Telephone encounter was:  Successful.  08/28/2021 Name: [name] [name] MRN: [id number] DOB: [birth year]  [name] [name] is a 87 y.o. year old female who is a primary care patient of Fisher, Kirstie Peri, MD . The community resource team was consulted for assistance with [place] guide performed the following interventions: Received email confirmation from patient's daugter Stephens November.   Follow Up Plan:  No further follow up planned at this time. The patient has been provided with needed resources.  Naquan Garman, AAS Paralegal, [facility] [place] [facility]  [address], [place] [address] ??[email or web address]   ?? [phone]   [email or web address]

## 2021-08-29 ENCOUNTER — Ambulatory Visit: Payer: Self-pay

## 2021-08-29 DIAGNOSIS — Z789 Other specified health status: Secondary | ICD-10-CM

## 2021-08-29 DIAGNOSIS — Z9181 History of falling: Secondary | ICD-10-CM

## 2021-08-29 DIAGNOSIS — E1149 Type 2 diabetes mellitus with other diabetic neurological complication: Secondary | ICD-10-CM

## 2021-09-01 ENCOUNTER — Ambulatory Visit (INDEPENDENT_AMBULATORY_CARE_PROVIDER_SITE_OTHER): Payer: Medicare HMO

## 2021-09-01 DIAGNOSIS — Z0000 Encounter for general adult medical examination without abnormal findings: Secondary | ICD-10-CM | POA: Diagnosis not present

## 2021-09-01 NOTE — Patient Instructions (Signed)
[name] [name] , Thank you for taking time to come for your Medicare Wellness Visit. I appreciate your ongoing commitment to your health goals. Please review the following plan we discussed and let me know if I can assist you in the future.   Screening recommendations/referrals: Colonoscopy: aged out Mammogram: 08/29/18 Bone Density: 04/21/17 Recommended yearly ophthalmology/optometry visit for glaucoma screening and checkup Recommended yearly dental visit for hygiene and checkup  Vaccinations: Influenza vaccine: needs Pneumococcal vaccine: 08/07/14 Tdap vaccine: 06/20/11, due Shingles vaccine: Zostavax 12/13/07   Covid-19:10/28/19, 11/21/19, 07/11/20   Advanced directives: no  Conditions/risks identified: none  Next appointment: Follow up in one year for your annual wellness visit    Preventive Care 22 Years and Older, Female Preventive care refers to lifestyle choices and visits with your health care provider that can promote health and wellness. What does preventive care include? A yearly physical exam. This is also called an annual well check. Dental exams once or twice a year. Routine eye exams. Ask your health care provider how often you should have your eyes checked. Personal lifestyle choices, including: Daily care of your teeth and gums. Regular physical activity. Eating a healthy diet. Avoiding tobacco and drug use. Limiting alcohol use. Practicing safe sex. Taking low-dose aspirin every day. Taking vitamin and mineral supplements as recommended by your health care provider. What happens during an annual well check? The services and screenings done by your health care provider during your annual well check will depend on your age, overall health, lifestyle risk factors, and family history of disease. Counseling  Your health care provider may ask you questions about your: Alcohol use. Tobacco use. Drug use. Emotional well-being. Home and relationship well-being. Sexual  activity. Eating habits. History of falls. Memory and ability to understand (cognition). Work and work Statistician. Reproductive health. Screening  You may have the following tests or measurements: Height, weight, and BMI. Blood pressure. Lipid and cholesterol levels. These may be checked every 5 years, or more frequently if you are over 73 years old. Skin check. Lung cancer screening. You may have this screening every year starting at age 48 if you have a 30-pack-year history of smoking and currently smoke or have quit within the past 15 years. Fecal occult blood test (FOBT) of the stool. You may have this test every year starting at age 31. Flexible sigmoidoscopy or colonoscopy. You may have a sigmoidoscopy every 5 years or a colonoscopy every 10 years starting at age 21. Hepatitis C blood test. Hepatitis B blood test. Sexually transmitted disease (STD) testing. Diabetes screening. This is done by checking your blood sugar (glucose) after you have not eaten for a while (fasting). You may have this done every 1-3 years. Bone density scan. This is done to screen for osteoporosis. You may have this done starting at age 68. Mammogram. This may be done every 1-2 years. Talk to your health care provider about how often you should have regular mammograms. Talk with your health care provider about your test results, treatment options, and if necessary, the need for more tests. Vaccines  Your health care provider may recommend certain vaccines, such as: Influenza vaccine. This is recommended every year. Tetanus, diphtheria, and acellular pertussis (Tdap, Td) vaccine. You may need a Td booster every 10 years. Zoster vaccine. You may need this after age 86. Pneumococcal 13-valent conjugate (PCV13) vaccine. One dose is recommended after age 36. Pneumococcal polysaccharide (PPSV23) vaccine. One dose is recommended after age 82. Talk to your health care provider  about which screenings and vaccines  you need and how often you need them. This information is not intended to replace advice given to you by your health care provider. Make sure you discuss any questions you have with your health care provider. Document Released: 09/27/2015 Document Revised: 05/20/2016 Document Reviewed: 07/02/2015 Elsevier Interactive Patient Education  2017 [place] Prevention in the Home Falls can cause injuries. They can happen to people of all ages. There are many things you can do to make your home safe and to help prevent falls. What can I do on the outside of my home? Regularly fix the edges of walkways and driveways and fix any cracks. Remove anything that might make you trip as you walk through a door, such as a raised step or threshold. Trim any bushes or trees on the path to your home. Use bright outdoor lighting. Clear any walking paths of anything that might make someone trip, such as rocks or tools. Regularly check to see if handrails are loose or broken. Make sure that both sides of any steps have handrails. Any raised decks and porches should have guardrails on the edges. Have any leaves, snow, or ice cleared regularly. Use sand or salt on walking paths during winter. Clean up any spills in your garage right away. This includes oil or grease spills. What can I do in the bathroom? Use night lights. Install grab bars by the toilet and in the tub and shower. Do not use towel bars as grab bars. Use non-skid mats or decals in the tub or shower. If you need to sit down in the shower, use a plastic, non-slip stool. Keep the floor dry. Clean up any water that spills on the floor as soon as it happens. Remove soap buildup in the tub or shower regularly. Attach bath mats securely with double-sided non-slip rug tape. Do not have throw rugs and other things on the floor that can make you trip. What can I do in the bedroom? Use night lights. Make sure that you have a light by your bed that  is easy to reach. Do not use any sheets or blankets that are too big for your bed. They should not hang down onto the floor. Have a firm chair that has side arms. You can use this for support while you get dressed. Do not have throw rugs and other things on the floor that can make you trip. What can I do in the kitchen? Clean up any spills right away. Avoid walking on wet floors. Keep items that you use a lot in easy-to-reach places. If you need to reach something above you, use a strong step stool that has a grab bar. Keep electrical cords out of the way. Do not use floor polish or wax that makes floors slippery. If you must use wax, use non-skid floor wax. Do not have throw rugs and other things on the floor that can make you trip. What can I do with my stairs? Do not leave any items on the stairs. Make sure that there are handrails on both sides of the stairs and use them. Fix handrails that are broken or loose. Make sure that handrails are as long as the stairways. Check any carpeting to make sure that it is firmly attached to the stairs. Fix any carpet that is loose or worn. Avoid having throw rugs at the top or bottom of the stairs. If you do have throw rugs, attach them to the floor  with carpet tape. Make sure that you have a light switch at the top of the stairs and the bottom of the stairs. If you do not have them, ask someone to add them for you. What else can I do to help prevent falls? Wear shoes that: Do not have high heels. Have rubber bottoms. Are comfortable and fit you well. Are closed at the toe. Do not wear sandals. If you use a stepladder: Make sure that it is fully opened. Do not climb a closed stepladder. Make sure that both sides of the stepladder are locked into place. Ask someone to hold it for you, if possible. Clearly mark and make sure that you can see: Any grab bars or handrails. First and last steps. Where the edge of each step is. Use tools that help you  move around (mobility aids) if they are needed. These include: Canes. Walkers. Scooters. Crutches. Turn on the lights when you go into a dark area. Replace any light bulbs as soon as they burn out. Set up your furniture so you have a clear path. Avoid moving your furniture around. If any of your floors are uneven, fix them. If there are any pets around you, be aware of where they are. Review your medicines with your doctor. Some medicines can make you feel dizzy. This can increase your chance of falling. Ask your doctor what other things that you can do to help prevent falls. This information is not intended to replace advice given to you by your health care provider. Make sure you discuss any questions you have with your health care provider. Document Released: 06/27/2009 Document Revised: 02/06/2016 Document Reviewed: 10/05/2014 Elsevier Interactive Patient Education  2017 [employer].

## 2021-09-01 NOTE — Chronic Care Management (AMB) (Signed)
Chronic Care Management   CCM RN Visit Note   Name: [name] [name] MRN: [id number] DOB: [birth year]  Subjective: [name] Vandyne is a 87 y.o. year old female who is a primary care patient of Caryn Section, Kirstie Peri, MD. The care management team was consulted for assistance with disease management and care coordination needs.    Engaged with patient's daughter/caregiver by telephone for follow up visit in response to provider referral for case management and care coordination services.   Consent to Services:  The patient was given information about Chronic Care Management services, agreed to services, and gave verbal consent prior to initiation of services.  Please see initial visit note for detailed documentation.    Assessment: Review of patient past medical history, allergies, medications, health status, including review of consultants reports, laboratory and other test data, was performed as part of comprehensive evaluation and provision of chronic care management services.   SDOH (Social Determinants of Health) assessments and interventions performed: No  CCM Care Plan  Allergies  Allergen Reactions   Aricept [Donepezil]     Irritability    Lovastatin     Weakness   Pravastatin Sodium     Muscle weakness   Simvastatin     Fatigue    Outpatient Encounter Medications as of 08/29/2021  Medication Sig Note   Accu-Chek Softclix Lancets lancets USE TO CHECK BLOOD SUGAR ONCE A DAY    Alcohol Swabs (DROPSAFE ALCOHOL PREP) 70 % PADS USE TO TEST BLOOD SUGAR EVERY DAY    ALPRAZolam (XANAX) 0.25 MG tablet Take 1 tablet (0.25 mg total) by mouth every 6 (six) hours as needed for anxiety. (Patient not taking: Reported on 08/04/2021)    amLODipine (NORVASC) 2.5 MG tablet Take 1 tablet (2.5 mg total) by mouth daily. (Patient taking differently: Take 5 mg by mouth daily.)    amLODipine (NORVASC) 5 MG tablet TAKE 1 TABLET EVERY DAY    ascorbic acid (VITAMIN C) 500 MG tablet Take 500 mg by mouth  daily. (Patient not taking: Reported on 08/04/2021)    aspirin 81 MG tablet Take 1 tablet by mouth daily. (Patient not taking: Reported on 09/01/2021) 03/27/2015: Received from: [place]:    Blood Glucose Monitoring Suppl (ACCU-CHEK AVIVA PLUS) w/Device KIT USE TO CHECK BLOOD SUGAR ONCE A DAY    Blood Pressure Monitoring (BLOOD PRESSURE MONITOR/ARM) DEVI Use to check blood pressure of upper arm daily as needed for hypertension (I10)    cyclobenzaprine (FLEXERIL) 5 MG tablet Take 1 tablet (5 mg total) by mouth 3 (three) times daily as needed for muscle spasms. Take in place of tizanidine    DULoxetine (CYMBALTA) 30 MG capsule TAKE 1 CAPSULE EVERY DAY    ezetimibe (ZETIA) 10 MG tablet TAKE 1 TABLET (10 MG TOTAL) BY MOUTH DAILY.    fluticasone (FLONASE) 50 MCG/ACT nasal spray PLACE 2 SPRAYS INTO BOTH NOSTRILS DAILY.    furosemide (LASIX) 20 MG tablet TAKE 1 TABLET EVERY DAY    gabapentin (NEURONTIN) 100 MG capsule TAKE 1 CAPSULE EVERY MORNING    gabapentin (NEURONTIN) 300 MG capsule TAKE 1 CAPSULE BY MOUTH AT BEDTIME. IN ADDITION TO THE 100MG CAPSULE IN THE MORNING    glipiZIDE (GLUCOTROL XL) 10 MG 24 hr tablet TAKE 1 TABLET EVERY DAY    glucose blood (ACCU-CHEK AVIVA PLUS) test strip TEST BLOOD SUGAR ONE TIME DAILY    levothyroxine (SYNTHROID) 75 MCG tablet TAKE 1 TABLET BY MOUTH EVERY DAY    metFORMIN (GLUCOPHAGE-XR)  750 MG 24 hr tablet TAKE 1 TABLET BY MOUTH 2 TIMES DAILY.    Multiple Vitamins-Minerals (DAILY MULTIVITAMIN PO) Take 1 tablet by mouth.    Omega-3 Fatty Acids (FISH OIL) 1000 MG CAPS Take 1 capsule by mouth daily. (Patient not taking: Reported on 11/05/2020) 03/27/2015: Received from: [place]: Take by mouth.   oxybutynin (DITROPAN) 5 MG tablet TAKE 1 TABLET 2 TO 3 TIMES DAILY AS NEEDED FOR  URINARY FREQUENCY    quinapril (ACCUPRIL) 40 MG tablet TAKE 2 TABLETS BY MOUTH EVERY DAY    tizanidine (ZANAFLEX) 2 MG capsule Take  1-2 capsules (2-4 mg total) by mouth at bedtime as needed for muscle spasms.    No facility-administered encounter medications on file as of 08/29/2021.    Patient Active Problem List   Diagnosis Date Noted   Weakness of both lower extremities 06/21/2020   Hypertensive retinopathy of both eyes 12/10/2017   Polyuria 04/01/2015   Breast fibroadenoma 03/27/2015   Diabetes mellitus type 2 with neurological manifestations ([place]) 03/27/2015   Edema 03/27/2015   HLD (hyperlipidemia) 03/27/2015   Hypertension 03/27/2015   Hypothyroid 03/27/2015   Lactose intolerance 03/27/2015   Lumbar disc disease 03/27/2015   Nocturia 03/27/2015   Osteoarthritis 03/27/2015   Lumbar canal stenosis 03/27/2015   B12 deficiency 03/27/2015   Arthritis, degenerative 12/31/2014   Enthesopathy of hip 12/31/2014   Dizziness and giddiness 12/31/2014   Calcification of intervertebral cartilage or disc of lumbar region 12/31/2014   Benign breast neoplasm 12/31/2014   DDD (degenerative disc disease), lumbar 03/27/2014   Neuritis or radiculitis due to rupture of lumbar intervertebral disc 03/26/2014    Patient Care Plan: RN Care Management Plan of Care     Problem Identified: Disease Progression (Hypertension)      Long-Range Goal: Disease Progression Prevented or Minimized   Start Date: 08/06/2021  Expected End Date: 11/04/2021  Priority: High  Note:   Current Barriers:  Chronic Disease Management support and education needs related to HLD, DMII, Fall Risk and Memory Change.  RNCM Clinical Goal(s):  Patient will continue to work with RN Care Manager and the care management team to address concerns related to disease management and community resource needs.   Interventions: 1:1 collaboration with primary care provider regarding development and update of comprehensive plan of care as evidenced by provider attestation and co-signature Inter-disciplinary care team collaboration (see longitudinal plan of  care) Evaluation of current treatment plan related to  self management and patient's adherence to plan as established by provider   Diabetes Interventions:  (Status:  Condition stable.  Not addressed this visit.) Long Term Goal Assessed patient's understanding of A1c goal: <7%  Lab Results  Component Value Date   HGBA1C 6.5 (A) 05/05/2021   Falls Interventions:  (Status:  Goal on track:  Yes.) Long Term Goal Reviewed information regarding safety and fall prevention. Per caregiver, patient's balance/gait remains unsteady. Denies recent falls. She continues to monitor closely and follow recommended precautions. Discussed ability to perform ADL's and tasks in the home. Patient has very good support from her caregiver/daughter but functional status has declined. Requires minimal assistance with ADL's. Requires assistance with IADL's. Daughter currently assisting and serving as the primary caregiver. Discussed need for additional assistance in the home. Mrs. Lenis is very reluctant to agree to members assisting in the home. She has agreed to routine visits from the Palliative Care team. No changes to Goal of Care. Plan is to maintain safety and as much  independence as possible in the home.  Discussed options for Mrs. Berberian to participate in activities outside of the home to increase socialization. Will follow up to discuss available Adult Day Programs is patient is agreeable to attending.  Follow up regarding request for nutritional assistance. Patient does not drive and unable to prepare meals independently. Daughter/caregiver confirmed outreach with the [employer]. Will completed needed application for assistance as requested. Agreed to contact the team if Mrs. Goold requires additional assistance.   Patient Goals/Self-Care Activities: Take all medications as prescribed Follow recommended safety and fall prevention measures Continue to work with the [facility] team  Call  provider office for new concerns or questions  Continue to work with the the care management team to address care coordination needs and goals of care  Follow Up Plan:   Will follow up later this month          PLAN A member of the care management team will follow up next week.   [facility] [phone]

## 2021-09-01 NOTE — Progress Notes (Signed)
Virtual Visit via Telephone Note  I connected with  [name] [name] on 09/01/21 at  8:20 AM EST by telephone and verified that I am speaking with the correct person using two identifiers.  Location: Patient: home Provider: [facility] Persons participating in the virtual visit: [place]   I discussed the limitations, risks, security and privacy concerns of performing an evaluation and management service by telephone and the availability of in person appointments. The patient expressed understanding and agreed to proceed.  Interactive audio and video telecommunications were attempted between this nurse and patient, however failed, due to patient having technical difficulties OR patient did not have access to video capability.  We continued and completed visit with audio only.  Some vital signs may be absent or patient reported.   Dionisio David, LPN  Subjective:   [name] [name] is a 87 y.o. female who presents for Medicare Annual (Subsequent) preventive examination.  Review of Systems           Objective:    Today's Vitals   09/01/21 0826  PainSc: 6    There is no height or weight on file to calculate BMI.  Advanced Directives 08/26/2020 11/08/2017 07/06/2017 05/27/2015  Does Patient Have a Medical Advance Directive? Yes No No No  Type of Paramedic of [place];Living will - - -  Copy of [place] in Chart? No - copy requested - - -  Would patient like information on creating a medical advance directive? - Yes (MAU/Ambulatory/Procedural Areas - Information given) - -    Current Medications (verified) Outpatient Encounter Medications as of 09/01/2021  Medication Sig   Accu-Chek Softclix Lancets lancets USE TO CHECK BLOOD SUGAR ONCE A DAY   Alcohol Swabs (DROPSAFE ALCOHOL PREP) 70 % PADS USE TO TEST BLOOD SUGAR EVERY DAY   ALPRAZolam (XANAX) 0.25 MG tablet Take 1 tablet (0.25 mg total) by mouth every 6 (six) hours  as needed for anxiety. (Patient not taking: Reported on 08/04/2021)   amLODipine (NORVASC) 2.5 MG tablet Take 1 tablet (2.5 mg total) by mouth daily. (Patient taking differently: Take 5 mg by mouth daily.)   amLODipine (NORVASC) 5 MG tablet TAKE 1 TABLET EVERY DAY   ascorbic acid (VITAMIN C) 500 MG tablet Take 500 mg by mouth daily. (Patient not taking: Reported on 08/04/2021)   aspirin 81 MG tablet Take 1 tablet by mouth daily.   Blood Glucose Monitoring Suppl (ACCU-CHEK AVIVA PLUS) w/Device KIT USE TO CHECK BLOOD SUGAR ONCE A DAY   Blood Pressure Monitoring (BLOOD PRESSURE MONITOR/ARM) DEVI Use to check blood pressure of upper arm daily as needed for hypertension (I10)   cyclobenzaprine (FLEXERIL) 5 MG tablet Take 1 tablet (5 mg total) by mouth 3 (three) times daily as needed for muscle spasms. Take in place of tizanidine   DULoxetine (CYMBALTA) 30 MG capsule TAKE 1 CAPSULE EVERY DAY   ezetimibe (ZETIA) 10 MG tablet TAKE 1 TABLET (10 MG TOTAL) BY MOUTH DAILY.   fluticasone (FLONASE) 50 MCG/ACT nasal spray PLACE 2 SPRAYS INTO BOTH NOSTRILS DAILY.   furosemide (LASIX) 20 MG tablet TAKE 1 TABLET EVERY DAY   gabapentin (NEURONTIN) 100 MG capsule TAKE 1 CAPSULE EVERY MORNING   gabapentin (NEURONTIN) 300 MG capsule TAKE 1 CAPSULE BY MOUTH AT BEDTIME. IN ADDITION TO THE 100MG CAPSULE IN THE MORNING   glipiZIDE (GLUCOTROL XL) 10 MG 24 hr tablet TAKE 1 TABLET EVERY DAY   glucose blood (ACCU-CHEK AVIVA PLUS) test strip  TEST BLOOD SUGAR ONE TIME DAILY   levothyroxine (SYNTHROID) 75 MCG tablet TAKE 1 TABLET BY MOUTH EVERY DAY   metFORMIN (GLUCOPHAGE-XR) 750 MG 24 hr tablet TAKE 1 TABLET BY MOUTH 2 TIMES DAILY.   Multiple Vitamins-Minerals (DAILY MULTIVITAMIN PO) Take 1 tablet by mouth.   Omega-3 Fatty Acids (FISH OIL) 1000 MG CAPS Take 1 capsule by mouth daily. (Patient not taking: Reported on 11/05/2020)   oxybutynin (DITROPAN) 5 MG tablet TAKE 1 TABLET 2 TO 3 TIMES DAILY AS NEEDED FOR  URINARY FREQUENCY    quinapril (ACCUPRIL) 40 MG tablet TAKE 2 TABLETS BY MOUTH EVERY DAY   tizanidine (ZANAFLEX) 2 MG capsule Take 1-2 capsules (2-4 mg total) by mouth at bedtime as needed for muscle spasms.   No facility-administered encounter medications on file as of 09/01/2021.    Allergies (verified) Aricept [donepezil], Lovastatin, Pravastatin sodium, and Simvastatin   History: Past Medical History:  Diagnosis Date   Allergy    Arthritis    Cancer ([place])    thryoid   Cataract    Diabetes mellitus without complication ([place])    Hyperlipidemia    Hypertension    Thyroid disease    Past Surgical History:  Procedure Laterality Date   ABDOMINAL HYSTERECTOMY     BREAST BIOPSY Right ?   benign   CATARACT EXTRACTION     THYROIDECTOMY, PARTIAL  1994   TONSILLECTOMY     Family History  Problem Relation Age of Onset   Cancer Daughter        breast   Diabetes Son    Social History   Socioeconomic History   Marital status: Married    Spouse name: Not on file   Number of children: 3   Years of education: Not on file   Highest education level: 10th grade  Occupational History   Occupation: Retired   Tobacco Use   Smoking status: Never   Smokeless tobacco: Never  Scientific laboratory technician Use: Never used  Substance and Sexual Activity   Alcohol use: No    Alcohol/week: 0.0 standard drinks   Drug use: No   Sexual activity: Not on file  Other Topics Concern   Not on file  Social History Narrative   Not on file   Social Determinants of Health   Financial Resource Strain: Medium Risk   Difficulty of Paying Living Expenses: Somewhat hard  Food Insecurity: Landscape architect Present   Worried About Charity fundraiser in the Last Year: Sometimes true   Ran Out of Food in the Last Year: Sometimes true  Transportation Needs: Not on file  Physical Activity: Not on file  Stress: Not on file  Social Connections: Not on file    Tobacco Counseling Counseling given: Not Answered   Clinical  Intake:  Pre-visit preparation completed: Yes  Pain : 0-10 Pain Score: 6  Pain Type: Chronic pain Pain Location: Leg Pain Orientation: Right, Left Pain Descriptors / Indicators: Aching Pain Onset: More than a month ago Pain Frequency: Intermittent     Nutritional Risks: None Diabetes: Yes CBG done?: No Did pt. bring in CBG monitor from home?: No  How often do you need to have someone help you when you read instructions, pamphlets, or other written materials from your doctor or pharmacy?: 1 - Never  Diabetic?yes Nutrition Risk Assessment:  Has the patient had any N/V/D within the last 2 months?  No  Does the patient have any non-healing wounds?  No  Has the  patient had any unintentional weight loss or weight gain?  No   Diabetes:  Is the patient diabetic?  Yes  If diabetic, was a CBG obtained today?  No  Did the patient bring in their glucometer from home?  No  How often do you monitor your CBG's? Not very often.   Financial Strains and Diabetes Management:  Are you having any financial strains with the device, your supplies or your medication? No .  Does the patient want to be seen by Chronic Care Management for management of their diabetes?  No  Would the patient like to be referred to a Nutritionist or for Diabetic Management?  No   Diabetic Exams:  Diabetic Eye Exam: Completed 04/01/20 (negative retinopathy). Overdue for diabetic eye exam. Pt has been advised about the importance in completing this exam.   Diabetic Foot Exam: Completed n/d. Pt has been advised about the importance in completing this exam. Interpreter Needed?: No  Information entered by :: Kirke Shaggy, LPN     Activities of Daily Living In your present state of health, do you have any difficulty performing the following activities: 05/05/2021  Hearing? N  Vision? N  Difficulty concentrating or making decisions? Y  Walking or climbing stairs? Y  Dressing or bathing? Y  Doing errands,  shopping? Y  Some recent data might be hidden    Patient Care Team: Birdie Sons, MD as PCP - General (Family Medicine) Sharlet Salina, MD as Referring Physician (Physical Medicine and Rehabilitation) Marshall Cork, MD as Consulting Physician (Ophthalmology) Neldon Labella, RN as Case Manager  Indicate any recent Medical Services you may have received from other than [facility] providers in the past year (date may be approximate).     Assessment:   This is a routine wellness examination for [name].  Hearing/Vision screen No results found.  Dietary issues and exercise activities discussed:     Goals Addressed   None    Depression Screen PHQ 2/9 Scores 05/05/2021 12/03/2020 11/05/2020 08/26/2020 08/06/2020 08/15/2019 11/08/2017  PHQ - 2 Score 2 2 0 1 0 0 2  PHQ- 9 Score _0 - - - 5    Fall Risk Fall Risk  05/23/2021 05/05/2021 11/25/2020 11/05/2020 08/26/2020  Falls in the past year? _1 Number falls in past yr: _2 Injury with Fall? 0 0 0 0 0  Risk for fall due to : History of fall(s) History of fall(s) History of fall(s);Medication side effect History of fall(s) Impaired balance/gait;Impaired mobility  Follow up Falls prevention discussed Falls evaluation completed Falls prevention discussed Falls prevention discussed Falls prevention discussed    FALL RISK PREVENTION PERTAINING TO THE HOME:  Any stairs in or around the home? No  If so, are there any without handrails? No  Home free of loose throw rugs in walkways, pet beds, electrical cords, etc? Yes  Adequate lighting in your home to reduce risk of falls? Yes   ASSISTIVE DEVICES UTILIZED TO PREVENT FALLS:  Life alert? No  Use of a cane, walker or w/c? Yes  Grab bars in the bathroom? Yes  Shower chair or bench in shower? Yes  Elevated toilet seat or a handicapped toilet? Yes     Cognitive Function:Cognitive status assessed by indirect observation.  Spoke w/ daughter Lesle Chris Screen  08/25/2016  What Year? 0 points  What month? 0 points  What time? 0 points  Count back from 20 0 points  Months in reverse 0 points  Repeat phrase 4 points  Total Score 4    Immunizations Immunization History  Administered Date(s) Administered   Fluad Quad(high Dose 65+) 06/14/2019, 06/21/2020   Influenza, High Dose Seasonal PF 05/27/2015, 06/24/2016, 07/15/2017, 07/30/2018   PFIZER(Purple Top)SARS-COV-2 Vaccination 10/28/2019, 11/21/2019, 07/11/2020   Pneumococcal Conjugate-13 08/07/2014   Pneumococcal Polysaccharide-23 12/31/2000   Td 02/19/1998   Tdap 06/20/2011   Zoster, Live 12/13/2007    TDAP status: Due, Education has been provided regarding the importance of this vaccine. Advised may receive this vaccine at local pharmacy or Health Dept. Aware to provide a copy of the vaccination record if obtained from local pharmacy or Health Dept. Verbalized acceptance and understanding.  Flu Vaccine status: Due, Education has been provided regarding the importance of this vaccine. Advised may receive this vaccine at local pharmacy or Health Dept. Aware to provide a copy of the vaccination record if obtained from local pharmacy or Health Dept. Verbalized acceptance and understanding.  Pneumococcal vaccine status: Up to date  Covid-19 vaccine status: Completed vaccines  Qualifies for Shingles Vaccine? Yes   Zostavax completed Yes   Shingrix Completed?: No.    Education has been provided regarding the importance of this vaccine. Patient has been advised to call insurance company to determine out of pocket expense if they have not yet received this vaccine. Advised may also receive vaccine at local pharmacy or Health Dept. Verbalized acceptance and understanding.  Screening Tests Health Maintenance  Topic Date Due   FOOT EXAM  Never done   Zoster Vaccines- Shingrix (1 of 2) Never done   MAMMOGRAM  08/30/2019   DEXA SCAN  04/21/2020   COVID-19 Vaccine (4 - Booster for Pfizer series)  09/05/2020   OPHTHALMOLOGY EXAM  04/01/2021   INFLUENZA VACCINE  04/14/2021   TETANUS/TDAP  06/19/2021   HEMOGLOBIN A1C  11/05/2021   Pneumonia Vaccine 9+ Years old  Completed   HPV VACCINES  Aged Out    Health Maintenance  Health Maintenance Due  Topic Date Due   FOOT EXAM  Never done   Zoster Vaccines- Shingrix (1 of 2) Never done   MAMMOGRAM  08/30/2019   DEXA SCAN  04/21/2020   COVID-19 Vaccine (4 - Booster for Pfizer series) 09/05/2020   OPHTHALMOLOGY EXAM  04/01/2021   INFLUENZA VACCINE  04/14/2021   TETANUS/TDAP  06/19/2021    Colorectal cancer screening: Type of screening: Colonoscopy. Completed n/d. Repeat every (aged out) years  Mammogram status: Completed 08/29/18. Repeat every year  Bone Density status: Completed 04/21/17. Results reflect: Bone density results: OSTEOPENIA. Repeat every (aged out) years.  Lung Cancer Screening: (Low Dose CT Chest recommended if Age 59-80 years, 30 pack-year currently smoking OR have quit w/in 15years.) does not qualify.    Additional Screening:  Hepatitis C Screening: does not qualify; Completed no  Vision Screening: Recommended annual ophthalmology exams for early detection of glaucoma and other disorders of the eye. Is the patient up to date with their annual eye exam?  Yes  Who is the provider or what is the name of the office in which the patient attends annual eye exams? Dr.Weaver in [place] If pt is not established with a provider, would they like to be referred to a provider to establish care? No .   Dental Screening: Recommended annual dental exams for proper oral hygiene  Community Resource Referral / Chronic Care Management: CRR required this visit?  No   CCM required this visit?  No  Plan:     I have personally reviewed and noted the following in the patients chart:   Medical and social history Use of alcohol, tobacco or illicit drugs  Current medications and supplements including opioid  prescriptions.  Functional ability and status Nutritional status Physical activity Advanced directives List of other physicians Hospitalizations, surgeries, and ER visits in previous 12 months Vitals Screenings to include cognitive, depression, and falls Referrals and appointments  In addition, I have reviewed and discussed with patient certain preventive protocols, quality metrics, and best practice recommendations. A written personalized care plan for preventive services as well as general preventive health recommendations were provided to patient.     Dionisio David, LPN   [id number]   Nurse Notes: none

## 2021-09-04 ENCOUNTER — Other Ambulatory Visit: Payer: Medicare HMO | Admitting: Student

## 2021-09-04 ENCOUNTER — Other Ambulatory Visit: Payer: Self-pay

## 2021-09-04 DIAGNOSIS — Z515 Encounter for palliative care: Secondary | ICD-10-CM

## 2021-09-04 DIAGNOSIS — R52 Pain, unspecified: Secondary | ICD-10-CM

## 2021-09-04 DIAGNOSIS — F0393 Unspecified dementia, unspecified severity, with mood disturbance: Secondary | ICD-10-CM

## 2021-09-04 NOTE — Progress Notes (Signed)
[facility] Palliative Care Consult Note Telephone: [phone]  Fax: [phone]    Date of encounter: 09/04/21 1:21 PM PATIENT NAME: [name] [name] [place] [id number]   [phone] (home)  DOB: Feb 12, 1933 MRN: [id number] PRIMARY CARE PROVIDER:    Birdie Sons, MD,  [address] [place] [address] [phone]  REFERRING PROVIDER:   Birdie Sons, Kersey Dunn [address] [place],  [place] [address] [phone]  RESPONSIBLE PARTY:    Contact Information     Name Relation Home Work Mobile   [name],Teresa Daughter [phone]  [phone]        I met face to face with patient and family in the home. Palliative Care was asked to follow this patient by consultation request of  Fisher, Kirstie Peri, MD to address advance care planning and complex medical decision making. This is a follow up visit.                                   ASSESSMENT AND PLAN / RECOMMENDATIONS:   Advance Care Planning/Goals of Care: Goals include to maximize quality of life and symptom management. Our advance care planning conversation included a discussion about:    The value and importance of advance care planning  Experiences with loved ones who have been seriously ill or have died  Exploration of personal, cultural or spiritual beliefs that might influence medical decisions  Exploration of goals of care in the event of a sudden injury or illness  CODE STATUS: Full code  Ongoing education on Palliative Medicine. Palliative Medicine will continue to provide supportive care.   Symptom Management/Plan:  Dementia-patient requires cueing and redirection, assistance with adl's. Family to continue providing supportive care. Monitor for falls/safety, use walker for ambulation.   Pain-due to OA, DDD, Diabetes-Recommend adding gabapentin 100 mg each afternoon. Continue gabapentin 100 mg each morning and 300 mg QHS. Add  acetaminophen to daily routine each morning. Continue duloxetine, PRN xanaflex as directed. Monitor for worsening pain.   Follow up Palliative Care Visit: Palliative care will continue to follow for complex medical decision making, advance care planning, and clarification of goals. Return in 8 weeks or prn.  I spent 40 minutes providing this consultation. More than 50% of the time in this consultation was spent in counseling and care coordination.    PPS: 50%  HOSPICE ELIGIBILITY/DIAGNOSIS: TBD  Chief Complaint: Palliative Medicine follow up visit.   HISTORY OF PRESENT ILLNESS:  [name] Yonts is a 87 y.o. year old female  with dementia, frequent falls, weakness, urinary incontinence, T2DM, hypothyroidism, DDD, osteoarthritis.     Patient has been stable; she does endorse having worsening leg pain. Denies any shortness of breath, nausea, constipation. It is taking her longer to complete adl's. Appetite is good. She is sleeping well at night. She is sleeping later into the morning. No recent infections, ED visits or hospitalizations. A 10-point review of systems is negative, except for the pertinent positives and negatives detailed in the HPI.    History obtained from review of EMR, discussion with primary team, and interview with family, facility staff/caregiver and/or Ms. Callan.  I reviewed available labs, medications, imaging, studies and related documents from the EMR.  Records reviewed and summarized above.    Physical Exam: Pulse 72, resp 16, 140/72, sats 99% on room Constitutional: NAD General: frail appearing EYES: anicteric sclera,  lids intact, no discharge  ENMT: intact hearing, oral mucous membranes moist, dentition intact CV: S1S2, RRR, no LE edema Pulmonary: LCTA, no increased work of breathing, no cough, room air Abdomen:  normo-active BS + 4 quadrants, soft and non tender, no ascites GU: deferred MSK: no sarcopenia, moves all extremities, ambulatory with  walker Skin: warm and dry, no rashes or wounds on visible skin Neuro: generalized weakness, A & O x 2, forgetful Psych: non-anxious affect, pleasant Hem/lymph/immuno: no widespread bruising   Thank you for the opportunity to participate in the care of Ms. Langseth.  The palliative care team will continue to follow. Please call our office at [phone] if we can be of additional assistance.   Ezekiel Slocumb, NP   COVID-19 PATIENT SCREENING TOOL Asked and negative response unless otherwise noted:   Have you had symptoms of covid, tested positive or been in contact with someone with symptoms/positive test in the past 5-10 days? No

## 2021-09-13 DIAGNOSIS — E1149 Type 2 diabetes mellitus with other diabetic neurological complication: Secondary | ICD-10-CM

## 2021-09-13 DIAGNOSIS — I10 Essential (primary) hypertension: Secondary | ICD-10-CM

## 2021-10-16 ENCOUNTER — Other Ambulatory Visit: Payer: Self-pay | Admitting: Family Medicine

## 2021-10-16 NOTE — Telephone Encounter (Signed)
Requested Prescriptions  °Pending Prescriptions Disp Refills  °• metFORMIN (GLUCOPHAGE-XR) 750 MG 24 hr tablet [Pharmacy Med Name: METFORMIN HYDROCHLORIDE ER 750 MG Tablet Extended Release 24 Hour] 180 tablet   °  Sig: TAKE 1 TABLET TWICE DAILY  °  ° Endocrinology:  Diabetes - Biguanides Failed - 10/16/2021  5:51 PM  °  °  Failed - eGFR in normal range and within 360 days  °  GFR, Est African American  °Date Value Ref Range Status  °07/06/2017 69 > OR = 60 mL/min/1.73m2 Final  ° °GFR calc Af Amer  °Date Value Ref Range Status  °11/05/2020 68 >59 mL/min/1.73 Final  °  Comment:  °  **In accordance with recommendations from the NKF-ASN Task force,** °  [facility] is in the process of updating its eGFR calculation to the °  2021 CKD-EPI creatinine equation that estimates kidney function °  without a race variable. °  ° °GFR, Est Non African American  °Date Value Ref Range Status  °07/06/2017 60 > OR = 60 mL/min/1.73m2 Final  ° °GFR calc non Af Amer  °Date Value Ref Range Status  °11/05/2020 59 (L) >59 mL/min/1.73 Final  °   °  °  Failed - B12 Level in normal range and within 720 days  °  Vitamin B-12  °Date Value Ref Range Status  °11/05/2020 183 (L) 232 - 1,245 pg/mL Final  °   °  °  Failed - CBC within normal limits and completed in the last 12 months  °  WBC  °Date Value Ref Range Status  °11/05/2020 8.0 3.4 - 10.8 x10E3/uL Final  ° °RBC  °Date Value Ref Range Status  °11/05/2020 4.84 3.77 - 5.28 x10E6/uL Final  ° °Hemoglobin  °Date Value Ref Range Status  °11/05/2020 13.5 11.1 - 15.9 g/dL Final  ° °Hematocrit  °Date Value Ref Range Status  °11/05/2020 41.2 34.0 - 46.6 % Final  ° °MCHC  °Date Value Ref Range Status  °11/05/2020 32.8 31.5 - 35.7 g/dL Final  ° °MCH  °Date Value Ref Range Status  °11/05/2020 27.9 26.6 - 33.0 pg Final  ° °MCV  °Date Value Ref Range Status  °11/05/2020 85 79 - 97 fL Final  ° °No results found for: PLTCOUNTKUC, LABPLAT, POCPLA °RDW  °Date Value Ref Range Status  °11/05/2020 14.2 11.7 - 15.4 %  Final  ° °  °  °  Passed - Cr in normal range and within 360 days  °  Creat  °Date Value Ref Range Status  °07/06/2017 0.90 (H) 0.60 - 0.88 mg/dL Final  °  Comment:  °  For patients >49 years of age, the reference limit °for Creatinine is approximately 13% higher for people °identified as African-American. °. °  ° °Creatinine, Ser  °Date Value Ref Range Status  °11/05/2020 0.89 0.57 - 1.00 mg/dL Final  °  Comment:  °                 **Effective November 11, 2020 [facility] will begin** °                 reporting the 2021 CKD-EPI creatinine equation that °                 estimates kidney function without a race variable. °  ° °Creatinine, POC  °Date Value Ref Range Status  °02/26/2017 n/a mg/dL Final  °   °  °  Passed - HBA1C is between 0 and 7.9 and within   180 days    Hemoglobin A1C  Date Value Ref Range Status  05/05/2021 6.5 (A) 4.0 - 5.6 % Final   Hgb A1c MFr Bld  Date Value Ref Range Status  11/05/2020 6.6 (H) 4.8 - 5.6 % Final    Comment:             Prediabetes: 5.7 - 6.4          Diabetes: >6.4          Glycemic control for adults with diabetes: <7.0          Passed - Valid encounter within last 6 months    Recent Outpatient Visits          2 months ago Memory change   [facility] Birdie Sons, MD   3 months ago No-show for appointment   [facility] Birdie Sons, MD   4 months ago Leg cramps   [facility] Birdie Sons, MD   5 months ago Primary hypertension   [place], Vickki Muff, PA-C   11 months ago Primary hypertension   [place], Kirstie Peri, MD              DULoxetine (CYMBALTA) 30 MG capsule [Pharmacy Med Name: DULOXETINE HCL 30 MG Capsule Delayed Release Particles] 60 capsule     Sig: TAKE [place]     Psychiatry: Antidepressants - SNRI - duloxetine Passed - 10/16/2021  5:51 PM      Passed - Cr in normal range and within 360 days    Creat  Date Value  Ref Range Status  07/06/2017 0.90 (H) 0.60 - 0.88 mg/dL Final    Comment:    For patients >10 years of age, the reference limit for Creatinine is approximately 13% higher for people identified as African-American. .    Creatinine, Ser  Date Value Ref Range Status  11/05/2020 0.89 0.57 - 1.00 mg/dL Final    Comment:                   **Effective November 11, 2020 [facility] will begin**                  reporting the 2021 CKD-EPI creatinine equation that                  estimates kidney function without a race variable.    Creatinine, POC  Date Value Ref Range Status  02/26/2017 n/a mg/dL Final         Passed - eGFR is 30 or above and within 360 days    GFR, Est African American  Date Value Ref Range Status  07/06/2017 69 > OR = 60 mL/min/1.64m Final   GFR calc Af Amer  Date Value Ref Range Status  11/05/2020 68 >59 mL/min/1.73 Final    Comment:    **In accordance with recommendations from the NKF-ASN Task force,**   [facility] is in the process of updating its eGFR calculation to the   2021 CKD-EPI creatinine equation that estimates kidney function   without a race variable.    GFR, Est Non African American  Date Value Ref Range Status  07/06/2017 60 > OR = 60 mL/min/1.761mFinal   GFR calc non Af Amer  Date Value Ref Range Status  11/05/2020 59 (L) >59 mL/min/1.73 Final         Passed - Completed PHQ-2 or PHQ-9 in the last 360 days  Passed - Last BP in normal range    BP Readings from Last 1 Encounters:  05/05/21 (!) 134/57         Passed - Valid encounter within last 6 months    Recent Outpatient Visits          2 months ago Memory change   [place], MD   3 months ago No-show for appointment   [facility] Birdie Sons, MD   4 months ago Leg cramps   [facility] Birdie Sons, MD   5 months ago Primary hypertension   [place], Vickki Muff, PA-C   11 months  ago Primary hypertension   [place], Kirstie Peri, MD

## 2021-10-16 NOTE — Telephone Encounter (Signed)
Requested medications are due for refill today.  yes  Requested medications are on the active medications list.  yes  Last refill. 05/07/2021 #60 5 refills  Future visit scheduled.   no  Notes to clinic.  Failed protocol D/T missing labs    Requested Prescriptions  Pending Prescriptions Disp Refills   metFORMIN (GLUCOPHAGE-XR) 750 MG 24 hr tablet [Pharmacy Med Name: METFORMIN HYDROCHLORIDE ER 750 MG Tablet Extended Release 24 Hour] 180 tablet     Sig: TAKE 1 TABLET TWICE DAILY     Endocrinology:  Diabetes - Biguanides Failed - 10/16/2021  5:51 PM      Failed - eGFR in normal range and within 360 days    GFR, Est African American  Date Value Ref Range Status  07/06/2017 69 > OR = 60 mL/min/1.74m Final   GFR calc Af Amer  Date Value Ref Range Status  11/05/2020 68 >59 mL/min/1.73 Final    Comment:    **In accordance with recommendations from the NKF-ASN Task force,**   [facility] is in the process of updating its eGFR calculation to the   2021 CKD-EPI creatinine equation that estimates kidney function   without a race variable.    GFR, Est Non African American  Date Value Ref Range Status  07/06/2017 60 > OR = 60 mL/min/1.722mFinal   GFR calc non Af Amer  Date Value Ref Range Status  11/05/2020 59 (L) >59 mL/min/1.73 Final          Failed - B12 Level in normal range and within 720 days    Vitamin B-12  Date Value Ref Range Status  11/05/2020 183 (L) 232 - 1,245 pg/mL Final          Failed - CBC within normal limits and completed in the last 12 months    WBC  Date Value Ref Range Status  11/05/2020 8.0 3.4 - 10.8 x10E3/uL Final   RBC  Date Value Ref Range Status  11/05/2020 4.84 3.77 - 5.28 x10E6/uL Final   Hemoglobin  Date Value Ref Range Status  11/05/2020 13.5 11.1 - 15.9 g/dL Final   Hematocrit  Date Value Ref Range Status  11/05/2020 41.2 34.0 - 46.6 % Final   MCHC  Date Value Ref Range Status  11/05/2020 32.8 31.5 - 35.7 g/dL Final   [facility]  Value Ref Range Status  11/05/2020 27.9 26.6 - 33.0 pg Final   MCV  Date Value Ref Range Status  11/05/2020 85 79 - 97 fL Final   No results found for: PLTCOUNTKUC, LABPLAT, POCPLA RDW  Date Value Ref Range Status  11/05/2020 14.2 11.7 - 15.4 % Final         Passed - Cr in normal range and within 360 days    Creat  Date Value Ref Range Status  07/06/2017 0.90 (H) 0.60 - 0.88 mg/dL Final    Comment:    For patients >4956ears of age, the reference limit for Creatinine is approximately 13% higher for people identified as African-American. .    Creatinine, Ser  Date Value Ref Range Status  11/05/2020 0.89 0.57 - 1.00 mg/dL Final    Comment:                   **Effective November 11, 2020 [facility] will begin**                  reporting the 2021 CKD-EPI creatinine equation that  estimates kidney function without a race variable.    Creatinine, POC  Date Value Ref Range Status  02/26/2017 n/a mg/dL Final          Passed - HBA1C is between 0 and 7.9 and within 180 days    Hemoglobin A1C  Date Value Ref Range Status  05/05/2021 6.5 (A) 4.0 - 5.6 % Final   Hgb A1c MFr Bld  Date Value Ref Range Status  11/05/2020 6.6 (H) 4.8 - 5.6 % Final    Comment:             Prediabetes: 5.7 - 6.4          Diabetes: >6.4          Glycemic control for adults with diabetes: <7.0           Passed - Valid encounter within last 6 months    Recent Outpatient Visits           2 months ago Memory change   [facility] Birdie Sons, MD   3 months ago No-show for appointment   [facility] Birdie Sons, MD   4 months ago Leg cramps   [facility] Birdie Sons, MD   5 months ago Primary hypertension   [place], Vickki Muff, PA-C   11 months ago Primary hypertension   [facility] Birdie Sons, MD              Signed Prescriptions Disp Refills   DULoxetine  (CYMBALTA) 30 MG capsule 90 capsule 0    Sig: TAKE [place]     Psychiatry: Antidepressants - SNRI - duloxetine Passed - 10/16/2021  5:51 PM      Passed - Cr in normal range and within 360 days    Creat  Date Value Ref Range Status  07/06/2017 0.90 (H) 0.60 - 0.88 mg/dL Final    Comment:    For patients >66 years of age, the reference limit for Creatinine is approximately 13% higher for people identified as African-American. .    Creatinine, Ser  Date Value Ref Range Status  11/05/2020 0.89 0.57 - 1.00 mg/dL Final    Comment:                   **Effective November 11, 2020 [facility] will begin**                  reporting the 2021 CKD-EPI creatinine equation that                  estimates kidney function without a race variable.    Creatinine, POC  Date Value Ref Range Status  02/26/2017 n/a mg/dL Final          Passed - eGFR is 30 or above and within 360 days    GFR, Est African American  Date Value Ref Range Status  07/06/2017 69 > OR = 60 mL/min/1.50m Final   GFR calc Af Amer  Date Value Ref Range Status  11/05/2020 68 >59 mL/min/1.73 Final    Comment:    **In accordance with recommendations from the NKF-ASN Task force,**   [facility] is in the process of updating its eGFR calculation to the   2021 CKD-EPI creatinine equation that estimates kidney function   without a race variable.    GFR, Est Non African American  Date Value Ref Range Status  07/06/2017 60 > OR = 60 mL/min/1.771m  Final   GFR calc non Af Amer  Date Value Ref Range Status  11/05/2020 59 (L) >59 mL/min/1.73 Final          Passed - Completed PHQ-2 or PHQ-9 in the last 360 days      Passed - Last BP in normal range    BP Readings from Last 1 Encounters:  05/05/21 (!) 134/57          Passed - Valid encounter within last 6 months    Recent Outpatient Visits           2 months ago Memory change   [facility] Birdie Sons, MD   3 months ago No-show for  appointment   [facility] Birdie Sons, MD   4 months ago Leg cramps   [place], MD   5 months ago Primary hypertension   [place], Vickki Muff, PA-C   11 months ago Primary hypertension   [place], Kirstie Peri, MD

## 2021-10-23 ENCOUNTER — Ambulatory Visit (INDEPENDENT_AMBULATORY_CARE_PROVIDER_SITE_OTHER): Payer: Medicare HMO

## 2021-10-23 DIAGNOSIS — E1149 Type 2 diabetes mellitus with other diabetic neurological complication: Secondary | ICD-10-CM

## 2021-10-23 DIAGNOSIS — R413 Other amnesia: Secondary | ICD-10-CM

## 2021-10-23 DIAGNOSIS — Z9181 History of falling: Secondary | ICD-10-CM

## 2021-10-23 NOTE — Patient Instructions (Signed)
Thank you for allowing the Chronic Care Management team to participate in your care.  

## 2021-10-23 NOTE — Chronic Care Management (AMB) (Signed)
Chronic Care Management   CCM RN Visit Note  10/23/2021 Name: [name] [name] MRN: [id number] DOB: 1933-05-19  Subjective: [name] [name] is a 88 y.o. year old female who is a primary care patient of Caryn Section, Kirstie Peri, MD. The care management team was consulted for assistance with disease management and care coordination needs.    Engaged with patient's daughter/caregiver by telephone for follow up visit in response to provider referral for case management and care coordination services.   Consent to Services:  The patient was given information about Chronic Care Management services, agreed to services, and gave verbal consent prior to initiation of services.  Please see initial visit note for detailed documentation.   Assessment: Review of patient past medical history, allergies, medications, health status, including review of consultants reports, laboratory and other test data, was performed as part of comprehensive evaluation and provision of chronic care management services.   SDOH (Social Determinants of Health) assessments and interventions performed: No  CCM Care Plan  Allergies  Allergen Reactions   Aricept [Donepezil]     Irritability    Lovastatin     Weakness   Pravastatin Sodium     Muscle weakness   Simvastatin     Fatigue    Outpatient Encounter Medications as of 10/23/2021  Medication Sig Note   Accu-Chek Softclix Lancets lancets USE TO CHECK BLOOD SUGAR ONCE A DAY    Alcohol Swabs (DROPSAFE ALCOHOL PREP) 70 % PADS USE TO TEST BLOOD SUGAR EVERY DAY    ALPRAZolam (XANAX) 0.25 MG tablet Take 1 tablet (0.25 mg total) by mouth every 6 (six) hours as needed for anxiety. (Patient not taking: Reported on 08/04/2021)    amLODipine (NORVASC) 2.5 MG tablet Take 1 tablet (2.5 mg total) by mouth daily. (Patient taking differently: Take 5 mg by mouth daily.)    amLODipine (NORVASC) 5 MG tablet TAKE 1 TABLET EVERY DAY    ascorbic acid (VITAMIN C) 500 MG tablet Take 500 mg by  mouth daily. (Patient not taking: Reported on 08/04/2021)    aspirin 81 MG tablet Take 1 tablet by mouth daily. (Patient not taking: Reported on 09/01/2021) 03/27/2015: Received from: [facility]:    Blood Glucose Monitoring Suppl (ACCU-CHEK AVIVA PLUS) w/Device KIT USE TO CHECK BLOOD SUGAR ONCE A DAY    Blood Pressure Monitoring (BLOOD PRESSURE MONITOR/ARM) DEVI Use to check blood pressure of upper arm daily as needed for hypertension (I10)    cyclobenzaprine (FLEXERIL) 5 MG tablet Take 1 tablet (5 mg total) by mouth 3 (three) times daily as needed for muscle spasms. Take in place of tizanidine    DULoxetine (CYMBALTA) 30 MG capsule TAKE 1 CAPSULE EVERY DAY    ezetimibe (ZETIA) 10 MG tablet TAKE 1 TABLET (10 MG TOTAL) BY MOUTH DAILY.    fluticasone (FLONASE) 50 MCG/ACT nasal spray PLACE 2 SPRAYS INTO BOTH NOSTRILS DAILY.    furosemide (LASIX) 20 MG tablet TAKE 1 TABLET EVERY DAY    gabapentin (NEURONTIN) 100 MG capsule TAKE 1 CAPSULE EVERY MORNING    gabapentin (NEURONTIN) 300 MG capsule TAKE 1 CAPSULE BY MOUTH AT BEDTIME. IN ADDITION TO THE 100MG CAPSULE IN THE MORNING    glipiZIDE (GLUCOTROL XL) 10 MG 24 hr tablet TAKE 1 TABLET EVERY DAY    glucose blood (ACCU-CHEK AVIVA PLUS) test strip TEST BLOOD SUGAR ONE TIME DAILY    levothyroxine (SYNTHROID) 75 MCG tablet TAKE 1 TABLET BY MOUTH EVERY DAY    metFORMIN (GLUCOPHAGE-XR)  750 MG 24 hr tablet TAKE 1 TABLET TWICE DAILY    Multiple Vitamins-Minerals (DAILY MULTIVITAMIN PO) Take 1 tablet by mouth.    Omega-3 Fatty Acids (FISH OIL) 1000 MG CAPS Take 1 capsule by mouth daily. (Patient not taking: Reported on 11/05/2020) 03/27/2015: Received from: [facility]: Take by mouth.   oxybutynin (DITROPAN) 5 MG tablet TAKE 1 TABLET 2 TO 3 TIMES DAILY AS NEEDED FOR  URINARY FREQUENCY    quinapril (ACCUPRIL) 40 MG tablet TAKE 2 TABLETS BY MOUTH EVERY DAY    tizanidine (ZANAFLEX) 2 MG capsule Take 1-2  capsules (2-4 mg total) by mouth at bedtime as needed for muscle spasms.    No facility-administered encounter medications on file as of 10/23/2021.    Patient Active Problem List   Diagnosis Date Noted   Weakness of both lower extremities 06/21/2020   Hypertensive retinopathy of both eyes 12/10/2017   Polyuria 04/01/2015   Breast fibroadenoma 03/27/2015   Diabetes mellitus type 2 with neurological manifestations ([facility]) 03/27/2015   Edema 03/27/2015   HLD (hyperlipidemia) 03/27/2015   Hypertension 03/27/2015   Hypothyroid 03/27/2015   Lactose intolerance 03/27/2015   Lumbar disc disease 03/27/2015   Nocturia 03/27/2015   Osteoarthritis 03/27/2015   Lumbar canal stenosis 03/27/2015   B12 deficiency 03/27/2015   Arthritis, degenerative 12/31/2014   Enthesopathy of hip 12/31/2014   Dizziness and giddiness 12/31/2014   Calcification of intervertebral cartilage or disc of lumbar region 12/31/2014   Benign breast neoplasm 12/31/2014   DDD (degenerative disc disease), lumbar 03/27/2014   Neuritis or radiculitis due to rupture of lumbar intervertebral disc 03/26/2014    Patient Care Plan: RN Care Management Plan of Care     Problem Identified: DM, HLD, Fall Risk and Memory Changes      Long-Range Goal: Disease Progression Prevented or Minimized   Start Date: 08/06/2021  Expected End Date: 11/04/2021  Priority: High  Note:   Current Barriers:  Chronic Disease Management support and education needs related to HLD, DMII, Fall Risk and Memory Change.  RNCM Clinical Goal(s):  Patient will continue to work with RN Care Manager and the care management team to address concerns related to disease management and community resource needs.   Interventions: 1:1 collaboration with primary care provider regarding development and update of comprehensive plan of care as evidenced by provider attestation and co-signature Inter-disciplinary care team collaboration (see longitudinal plan of  care) Evaluation of current treatment plan related to  self management and patient's adherence to plan as established by provider   Diabetes Interventions:  (Status:  Condition stable.  Not addressed this visit.) Long Term Goal Assessed patient's understanding of A1c goal: <7%  Lab Results  Component Value Date   HGBA1C 6.5 (A) 05/05/2021   Falls Interventions:   Reviewed safety and fall prevention measures. Caregiver/daughter has not observed recent falls. Reports patient's ability to ambulate has not changed. Daughter continues to monitor closely and prompts as needed. Discussed changes/decline in ability to perform self-care. Daughter reports patient would benefit from assistance but remains very reluctant to have members in her home. She is currently available to assist patient with ADLs but notes decline in patient's ability to control her bladder. They will consider medications if patient agrees.  Patient is unable to perform IADLs independently. Has very good support from her daughter who is currently available to assist as needed. Current plan is to remain in the home. Daughter will update the care management team if her functional  status continues to decline and a higher level of care is required.  Memory Changes: Reviewed current treatment plan for patient's wellness r/t memory changes. Daughter notes that patient's memory continues to decline. She now has difficulty distinguishing day and night. Caregiver agrees that patient would benefit from attending Adult Day Care to increase socialization. Notes patient continues to refuse and remains uncooperative with plan for medical appointments and follow-ups. She is currently engaged with the Palliative Care team and scheduled for virtual outreach within the next week. Will follow up to discuss the practitioner's recommendations.    Patient Goals/Self-Care Activities: Take all medications as prescribed Follow recommended safety and fall  prevention measures Continue to work with the [facility] team  Call provider office for new concerns or questions  Continue to work with the the care management team to address care coordination needs and goals of care       PLAN A member of the care management team will follow up within the next week.   [facility] Care Management [facility] [phone]

## 2021-10-28 ENCOUNTER — Other Ambulatory Visit: Payer: Self-pay

## 2021-10-28 ENCOUNTER — Telehealth: Payer: Medicare HMO | Admitting: Student

## 2021-10-28 DIAGNOSIS — R52 Pain, unspecified: Secondary | ICD-10-CM

## 2021-10-28 DIAGNOSIS — F0390 Unspecified dementia without behavioral disturbance: Secondary | ICD-10-CM

## 2021-10-28 DIAGNOSIS — Z515 Encounter for palliative care: Secondary | ICD-10-CM

## 2021-10-28 NOTE — Progress Notes (Signed)
[facility] Consult Note Telephone: [phone]  Fax: [phone]    Date of encounter: 10/28/21 12:29 PM PATIENT NAME: [name] [name] [place] [id number]   [phone] (home)  DOB: May 31, 1933 MRN: [id number] PRIMARY CARE PROVIDER:    Birdie Sons, MD,  [address] [place] [address] [phone]  REFERRING PROVIDER:   Birdie Sons, [address] [place] [address] [place],  [place] [address] [phone]  RESPONSIBLE PARTY:    Contact Information     Name Relation Home Work Mobile   [name],Teresa Daughter [phone]  [phone]       Due to the COVID-19 crisis, this visit was done via telemedicine from my office and it was initiated and consent by this patient and or family.  I connected with  [name] Salomone OR PROXY on 10/28/21 by a video enabled telemedicine application and verified that I am speaking with the correct person using two identifiers.   I discussed the limitations of evaluation and management by telemedicine. The patient expressed understanding and agreed to proceed.                                    ASSESSMENT AND PLAN / RECOMMENDATIONS:   Advance Care Planning/Goals of Care: Goals include to maximize quality of life and symptom management. Patient/health care surrogate gave his/her permission to discuss. CODE STATUS: Full Code  Symptom Management/Plan:  Dementia-patient requires cueing and redirection, assistance with adl's. Family to continue providing supportive care. Monitor for falls/safety, use walker for ambulation. No recent cognitive or functional declines reported.   Pain-due to OA, DDD, Diabetes-daughter had not initiated afternoon dose of gabapentin. She is encouraged to give gabapentin 100 mg each morning, afternoon and 300 mg QHS. Recommend acetaminophen BID. Continue duloxetine, PRN xanaflex as directed. Monitor for worsening pain.   Follow  up Palliative Care Visit: Palliative care will continue to follow for complex medical decision making, advance care planning, and clarification of goals. Return in 8 weeks or prn.   This visit was coded based on medical decision making (MDM).  PPS: 50%  HOSPICE ELIGIBILITY/DIAGNOSIS: TBD  Chief Complaint: Palliative Medicine follow up visit.   HISTORY OF PRESENT ILLNESS:  [name] Cosio is a 88 y.o. year old female  with  dementia, frequent falls, weakness, urinary incontinence, T2DM, hypothyroidism, DDD, osteoarthritis.    Patient reports doing well; she just celebrated her 44 th birthday. She does endorse LE pain, states he starts in her left leg and then goes into her right leg. No LE edema reported. She is sleeping more during the day, up at night. Daughter reports patient "having good and bad days." Good appetite is endorsed. Reports mood being stable; no behaviors reported. Uses walker for ambulation; no recent falls. A 10-point review of systems is negative, except for the pertinent positives and negatives detailed in the HPI.     History obtained from review of EMR, discussion with primary team, and interview with family, facility staff/caregiver and/or Ms. Sayre.  I reviewed available labs, medications, imaging, studies and related documents from the EMR.  Records reviewed and summarized above.    Physical Exam:  Constitutional: NAD General: frail appearing, thin EYES: anicteric sclera, lids intact, no discharge  ENMT: intact hearing CV: no LE edema Pulmonary: no increased work of breathing, no cough, room air GU: deferred MSK: moves all  extremities, ambulatory with walker Skin: no rashes or wounds on visible skin Neuro: generalized weakness,  A & O x 2, forgetful Psych: non-anxious affect, pleasant Hem/lymph/immuno: no widespread bruising   Thank you for the opportunity to participate in the care of Ms. Heinemann.  The palliative care team will continue to follow. Please  call our office at [phone] if we can be of additional assistance.   Ezekiel Slocumb, NP   COVID-19 PATIENT SCREENING TOOL Asked and negative response unless otherwise noted:   Have you had symptoms of covid, tested positive or been in contact with someone with symptoms/positive test in the past 5-10 days? No

## 2021-11-11 DIAGNOSIS — E1149 Type 2 diabetes mellitus with other diabetic neurological complication: Secondary | ICD-10-CM

## 2021-11-13 ENCOUNTER — Ambulatory Visit (INDEPENDENT_AMBULATORY_CARE_PROVIDER_SITE_OTHER): Payer: Medicare HMO

## 2021-11-13 DIAGNOSIS — I10 Essential (primary) hypertension: Secondary | ICD-10-CM

## 2021-11-13 DIAGNOSIS — Z9181 History of falling: Secondary | ICD-10-CM

## 2021-11-13 DIAGNOSIS — R413 Other amnesia: Secondary | ICD-10-CM

## 2021-11-13 NOTE — Chronic Care Management (AMB) (Signed)
Chronic Care Management   CCM RN Visit Note  11/13/2021 Name: [name] Yarbough MRN: [id number] DOB: March 20, 1933  Subjective: [name] [name] is a 88 y.o. year old female who is a primary care patient of Sherrie Mustache, Demetrios Isaacs, MD. The care management team was consulted for assistance with disease management and care coordination needs.    Engaged with patient's daughter/caregiver by telephone for follow up visit in response to provider referral for case management and care coordination services.   Consent to Services:  The patient was given information about Chronic Care Management services, agreed to services, and gave verbal consent prior to initiation of services.  Please see initial visit note for detailed documentation.   Assessment: Review of patient past medical history, allergies, medications, health status, including review of consultants reports, laboratory and other test data, was performed as part of comprehensive evaluation and provision of chronic care management services.   SDOH (Social Determinants of Health) assessments and interventions performed:  No CCM Care Plan  Allergies  Allergen Reactions   Aricept [Donepezil]     Irritability    Lovastatin     Weakness   Pravastatin Sodium     Muscle weakness   Simvastatin     Fatigue    Outpatient Encounter Medications as of 11/13/2021  Medication Sig Note   Accu-Chek Softclix Lancets lancets USE TO CHECK BLOOD SUGAR ONCE A DAY    Alcohol Swabs (DROPSAFE ALCOHOL PREP) 70 % PADS USE TO TEST BLOOD SUGAR EVERY DAY    ALPRAZolam (XANAX) 0.25 MG tablet Take 1 tablet (0.25 mg total) by mouth every 6 (six) hours as needed for anxiety. (Patient not taking: Reported on 08/04/2021)    amLODipine (NORVASC) 2.5 MG tablet Take 1 tablet (2.5 mg total) by mouth daily. (Patient taking differently: Take 5 mg by mouth daily.)    amLODipine (NORVASC) 5 MG tablet TAKE 1 TABLET EVERY DAY    ascorbic acid (VITAMIN C) 500 MG tablet Take 500 mg by  mouth daily. (Patient not taking: Reported on 08/04/2021)    aspirin 81 MG tablet Take 1 tablet by mouth daily. (Patient not taking: Reported on 09/01/2021) 03/27/2015: Received from: [employer] Received Sig:    Blood Glucose Monitoring Suppl (ACCU-CHEK AVIVA PLUS) w/Device KIT USE TO CHECK BLOOD SUGAR ONCE A DAY    Blood Pressure Monitoring (BLOOD PRESSURE MONITOR/ARM) DEVI Use to check blood pressure of upper arm daily as needed for hypertension (I10)    cyclobenzaprine (FLEXERIL) 5 MG tablet Take 1 tablet (5 mg total) by mouth 3 (three) times daily as needed for muscle spasms. Take in place of tizanidine    DULoxetine (CYMBALTA) 30 MG capsule TAKE 1 CAPSULE EVERY DAY 10/23/2021: Pending Refill   ezetimibe (ZETIA) 10 MG tablet TAKE 1 TABLET (10 MG TOTAL) BY MOUTH DAILY.    fluticasone (FLONASE) 50 MCG/ACT nasal spray PLACE 2 SPRAYS INTO BOTH NOSTRILS DAILY.    furosemide (LASIX) 20 MG tablet TAKE 1 TABLET EVERY DAY    gabapentin (NEURONTIN) 100 MG capsule TAKE 1 CAPSULE EVERY MORNING    gabapentin (NEURONTIN) 300 MG capsule TAKE 1 CAPSULE BY MOUTH AT BEDTIME. IN ADDITION TO THE 100MG  CAPSULE IN THE MORNING    glipiZIDE (GLUCOTROL XL) 10 MG 24 hr tablet TAKE 1 TABLET EVERY DAY    glucose blood (ACCU-CHEK AVIVA PLUS) test strip TEST BLOOD SUGAR ONE TIME DAILY    levothyroxine (SYNTHROID) 75 MCG tablet TAKE 1 TABLET BY MOUTH EVERY DAY    metFORMIN (  GLUCOPHAGE-XR) 750 MG 24 hr tablet TAKE 1 TABLET TWICE DAILY 10/23/2021: Pending Refill   Multiple Vitamins-Minerals (DAILY MULTIVITAMIN PO) Take 1 tablet by mouth.    Omega-3 Fatty Acids (FISH OIL) 1000 MG CAPS Take 1 capsule by mouth daily. (Patient not taking: Reported on 11/05/2020) 03/27/2015: Received from: [facility] Received Sig: Take by mouth.   oxybutynin (DITROPAN) 5 MG tablet TAKE 1 TABLET 2 TO 3 TIMES DAILY AS NEEDED FOR  URINARY FREQUENCY    quinapril (ACCUPRIL) 40 MG tablet TAKE 2 TABLETS BY MOUTH EVERY DAY     tizanidine (ZANAFLEX) 2 MG capsule Take 1-2 capsules (2-4 mg total) by mouth at bedtime as needed for muscle spasms.    No facility-administered encounter medications on file as of 11/13/2021.    Patient Active Problem List   Diagnosis Date Noted   Weakness of both lower extremities 06/21/2020   Hypertensive retinopathy of both eyes 12/10/2017   Polyuria 04/01/2015   Breast fibroadenoma 03/27/2015   Diabetes mellitus type 2 with neurological manifestations ([facility]) 03/27/2015   Edema 03/27/2015   HLD (hyperlipidemia) 03/27/2015   Hypertension 03/27/2015   Hypothyroid 03/27/2015   Lactose intolerance 03/27/2015   Lumbar disc disease 03/27/2015   Nocturia 03/27/2015   Osteoarthritis 03/27/2015   Lumbar canal stenosis 03/27/2015   B12 deficiency 03/27/2015   Arthritis, degenerative 12/31/2014   Enthesopathy of hip 12/31/2014   Dizziness and giddiness 12/31/2014   Calcification of intervertebral cartilage or disc of lumbar region 12/31/2014   Benign breast neoplasm 12/31/2014   DDD (degenerative disc disease), lumbar 03/27/2014   Neuritis or radiculitis due to rupture of lumbar intervertebral disc 03/26/2014    Patient Care Plan: RN Care Management Plan of Care     Problem Identified: DM, HLD, Fall Risk and Memory Changes      Long-Range Goal: Disease Progression Prevented or Minimized   Start Date: 08/06/2021  Expected End Date: 11/04/2021  Priority: High  Note:   Current Barriers:  Chronic Disease Management support and education needs related to HLD, DMII, Fall Risk and Memory Change.  RNCM Clinical Goal(s):  Patient will continue to work with RN Care Manager and the care management team to address concerns related to disease management and community resource needs.   Interventions: 1:1 collaboration with primary care provider regarding development and update of comprehensive plan of care as evidenced by provider attestation and co-signature Inter-disciplinary care team  collaboration (see longitudinal plan of care) Evaluation of current treatment plan related to  self management and patient's adherence to plan as established by provider   Diabetes Interventions:  (Status:  Condition stable.  Not addressed this visit.) Long Term Goal Assessed patient's understanding of A1c goal: <7%  Lab Results  Component Value Date   HGBA1C 6.5 (A) 05/05/2021   Falls Interventions:  (Goal Met) Reviewed safety and fall prevention measures.  Discussed functional status and in-home needs. Reports patient's status continues to slowly decline mostly d/t age and reluctance to engage in minimal activity or outings. She remains capable of completing some ADLs. Requires assistance with IADLs. Daughter continues to serve as primary care giver and remains available to assist as needed.  She has not experienced recent falls. Declined current need for in-home assistance. Will update the care management team if functional status continues to decline and in home assistance if required.   Wellness Interventions: Reviewed current plan for patient's wellness r/t memory changes. Daughter reports patient remains mostly oriented, but notes continued difficulty distinguishing day and night.  Reports that she also requires regular use of adult diapers. Reports that she often complains of boredom but continues to refuse activities via the senior program or routine outings. Notes that she is also very skeptical of having visitors in the home which also limits her socialization. Reports patient remains uncooperative with medical appointments but remains virtually engaged with the [facility] team.  Collaborated with several local agencies today regarding eligibility and availability of adult diaper supplies. Patient is not available for assistance via the [place] [facility]/Diaper Assistance Program due to residing in [place]. Contacted similar agency in [place]. Pending call back regarding  availability of supplies.  Agreed to continue exploring possible resources for activities in hopes that patient will agree. Daughter will contact the care management team as needed.    Patient Goals/Self-Care Activities: Take all medications as prescribed Follow recommended safety and fall prevention measures Continue to work with the Palliative Care team  Call provider office for new concerns or questions  Continue to work with the the care management team to address care coordination needs and goals of care       PLAN: Patient's daughter will contact the care management team with concerns as needed.   [facility] Care Management [facility] [phone]

## 2021-12-12 DIAGNOSIS — I10 Essential (primary) hypertension: Secondary | ICD-10-CM

## 2021-12-25 ENCOUNTER — Other Ambulatory Visit: Payer: Medicare HMO | Admitting: Student

## 2021-12-25 DIAGNOSIS — F0390 Unspecified dementia without behavioral disturbance: Secondary | ICD-10-CM

## 2021-12-25 DIAGNOSIS — Z515 Encounter for palliative care: Secondary | ICD-10-CM

## 2021-12-25 DIAGNOSIS — R52 Pain, unspecified: Secondary | ICD-10-CM

## 2021-12-25 NOTE — Progress Notes (Signed)
? ? ?Manufacturing engineer ?Community Palliative Care Consult Note ?Telephone: [phone]  ?Fax: [phone]  ? ? ?Date of encounter: 12/25/21 ?12:30 PM ?PATIENT NAME: [name] [name] ?[address] ?[place] [address]   ?[phone] (home)  ?DOB: Dec 27, 1932 ?MRN: [id number] ?PRIMARY CARE PROVIDER:    ?[name] Sons, MD,  ?[employer] [address] ?[place] [address] ?[phone] ? ?REFERRING PROVIDER:   ?[name] Sons, MD ?[address] ?[address] ?[place],  [place] [address] ?[phone] ? ?RESPONSIBLE PARTY:    ?Contact Information   ? ? Name Relation Home Work Mobile  ? [name] [name] Daughter [phone]  [phone]  ? ?  ? ? ? ?I met face to face with patient and family in the home. Palliative Care was asked to follow this patient by consultation request of  Fisher, Kirstie Peri, MD to address advance care planning and complex medical decision making. This is a follow up visit. ? ?                                 ASSESSMENT AND PLAN / RECOMMENDATIONS:  ? ?Advance Care Planning/Goals of Care: Goals include to maximize quality of life and symptom management. Patient/health care surrogate gave his/her permission to discuss. ?CODE STATUS: Full Code ? ?Palliative medicine will continue to provide supportive care, symptom management.  ? ?Symptom Management/Plan: ? ?Dementia-feels like she is slowing down physically. She declines any therapy intervention or additional assistance with adl's at this time. Patient requires cueing and redirection, assistance with adl's. Family to continue providing supportive care. Monitor for falls/safety, use walker for ambulation. Daughter is looking at day programs. ? ?Pain-due to OA, DDD. Continue gabapentin 100 mg each AM, 300 mg QHS, acetaminophen BID, duloxetine routinely. Continue PRN xanaflex.  ? ?Follow up Palliative Care Visit: Palliative care will continue to follow for complex medical decision making, advance care planning, and clarification of goals.  Return in 8 weeks or prn. ? ? ?This visit was coded based on medical decision making (MDM). ? ?PPS: 50% ? ?HOSPICE ELIGIBILITY/DIAGNOSIS: TBD ? ?Chief Complaint: Palliative Medicine follow up visit.  ? ?HISTORY OF PRESENT ILLNESS:  [name] [name] is a 88 y.o. year old female  with  dementia, frequent falls, weakness, urinary incontinence, T2DM, hypothyroidism, DDD, osteoarthritis.    ? ?Expresses that she is slowing down physically. Declines need for assistance. Uses walker for ambulation; no recent falls. Pain has been managed with her legs; left > right. She endorses a good appetite. Reports sleeping well at night. A 10-point review of systems is negative, except for the pertinent positives and negatives detailed in the HPI.  ? ?History obtained from review of EMR, discussion with primary team, and interview with family, facility staff/caregiver and/or [name] [name].  ?I reviewed available labs, medications, imaging, studies and related documents from the EMR.  Records reviewed and summarized above.  ? ? ?Physical Exam: ?Pulse 80, resp 16, b/p 136/70, 99% on room air ?Constitutional: NAD ?General: frail appearing ?EYES: anicteric sclera, lids intact, no discharge  ?ENMT: intact hearing, oral mucous membranes moist, dentition intact ?CV: S1S2, RRR, trace LE edema ?Pulmonary: LCTA, no increased work of breathing, no cough, room air ?Abdomen:  normo-active BS + 4 quadrants, soft and non tender, no ascites ?GU: deferred ?MSK:  moves all extremities, ambulatory ?Skin: warm and dry, no rashes or wounds on visible skin ?Neuro:  +generalized weakness,  A & O x 2, forgetful ?  Psych: non-anxious affect, slight agitation today ?Hem/lymph/immuno: no widespread bruising ? ? ?Thank you for the opportunity to participate in the care of [name] [name].  The palliative care team will continue to follow. Please call our office at [phone] if we can be of additional assistance.  ? ?Ezekiel Slocumb, NP  ? ?COVID-19 PATIENT SCREENING  TOOL ?Asked and negative response unless otherwise noted:  ? ?Have you had symptoms of covid, tested positive or been in contact with someone with symptoms/positive test in the past 5-10 days? No ? ?

## 2022-02-02 ENCOUNTER — Other Ambulatory Visit: Payer: Self-pay | Admitting: Family Medicine

## 2022-02-16 ENCOUNTER — Other Ambulatory Visit: Payer: Self-pay | Admitting: Family Medicine

## 2022-02-16 DIAGNOSIS — E1149 Type 2 diabetes mellitus with other diabetic neurological complication: Secondary | ICD-10-CM

## 2022-02-26 ENCOUNTER — Other Ambulatory Visit: Payer: Medicare HMO | Admitting: Student

## 2022-02-26 DIAGNOSIS — R52 Pain, unspecified: Secondary | ICD-10-CM

## 2022-02-26 DIAGNOSIS — Z515 Encounter for palliative care: Secondary | ICD-10-CM

## 2022-02-26 DIAGNOSIS — F0393 Unspecified dementia, unspecified severity, with mood disturbance: Secondary | ICD-10-CM

## 2022-02-26 NOTE — Progress Notes (Signed)
[facility] Palliative Care Consult Note Telephone: [phone]  Fax: [phone]    Date of encounter: 02/26/22 2:13 PM PATIENT NAME: [name] [name] [place] [address]   [phone] (home)  DOB: [birth year] MRN: [id number] PRIMARY CARE PROVIDER:    Birdie Sons, MD,  [address] [place] [address] [phone]  REFERRING PROVIDER:   Birdie Sons, [place],  [place] [address] [phone]  RESPONSIBLE PARTY:    Contact Information     Name Relation Home Work Mobile   [name],Teresa Daughter [phone]  [phone]        I met face to face with patient and family in the home. Palliative Care was asked to follow this patient by consultation request of  Fisher, Kirstie Peri, MD to address advance care planning and complex medical decision making. This is a follow up visit.                                   ASSESSMENT AND PLAN / RECOMMENDATIONS:   Advance Care Planning/Goals of Care: Goals include to maximize quality of life and symptom management. Patient/health care surrogate gave his/her permission to discuss. Our advance care planning conversation included a discussion about:    The value and importance of advance care planning  Experiences with loved ones who have been seriously ill or have died  Exploration of personal, cultural or spiritual beliefs that might influence medical decisions  Exploration of goals of care in the event of a sudden injury or illness  CODE STATUS: Full Code  Education provided on palliative medicine. Patient does express not wanting to go to hospital, no aggressive treatments. Will complete MOST form on next visit. Palliative will continue to provide supportive care, symptom management as needed.   Symptom Management/Plan:  Dementia-patient requires cueing and redirection. She does repeat herself more. She is eating well. Uses walker for  ambulation. Family to continue to provide supportive care. Monitor for cognitive and safety declines.   Pain-due to OA, DDD, neuropathy. Pain has been managed well. Continue gabapentin 100 mg each AM, 300 mg QHS, acetaminophen BID, duloxetine routinely. Continue PRN xanaflex.    Follow up Palliative Care Visit: Palliative care will continue to follow for complex medical decision making, advance care planning, and clarification of goals. Return in 8 weeks or prn.   This visit was coded based on medical decision making (MDM).  PPS: 50%  HOSPICE ELIGIBILITY/DIAGNOSIS: TBD  Chief Complaint: Palliative Medicine follow up visit.  HISTORY OF PRESENT ILLNESS:  [name] Stukey is a 88 y.o. year old female  with dementia, frequent falls, weakness, urinary incontinence, T2DM, hypothyroidism, DDD, osteoarthritis.      Endorses pain being managed well. Appetite has been good; weight has been stable. Uses rollator walker for ambulation, no recent falls. No recent infections, ED visits or hospitalizations. A 10-point review of systems is negative, except for the pertinent positives and negatives detailed in the HPI.   History obtained from review of EMR, discussion with primary team, and interview with family, facility staff/caregiver and/or Ms. Desroches.  I reviewed available labs, medications, imaging, studies and related documents from the EMR.  Records reviewed and summarized above.   Physical Exam:  Pulse 74, resp 16, b/p 140/78, sats 99% on room air Constitutional: NAD General: frail appearing EYES: anicteric sclera, lids intact, no discharge  ENMT: intact hearing, oral mucous membranes moist, dentition intact CV: S1S2, RRR, pedal LE edema Pulmonary: LCTA, no increased work of breathing, no cough, room air Abdomen:  normo-active BS + 4 quadrants, soft and non tender, no ascites GU: deferred MSK: moves all extremities, ambulatory Skin: warm and dry, no rashes or wounds on visible skin Neuro:   no generalized weakness, A & O x 2, forgetful Psych: non-anxious affect, pleasant Hem/lymph/immuno: no widespread bruising   Thank you for the opportunity to participate in the care of Ms. Majid.  The palliative care team will continue to follow. Please call our office at [phone] if we can be of additional assistance.   Ezekiel Slocumb, NP   COVID-19 PATIENT SCREENING TOOL Asked and negative response unless otherwise noted:   Have you had symptoms of covid, tested positive or been in contact with someone with symptoms/positive test in the past 5-10 days? No

## 2022-03-12 ENCOUNTER — Other Ambulatory Visit: Payer: Self-pay | Admitting: Family Medicine

## 2022-03-12 DIAGNOSIS — M159 Polyosteoarthritis, unspecified: Secondary | ICD-10-CM

## 2022-03-12 DIAGNOSIS — M5136 Other intervertebral disc degeneration, lumbar region: Secondary | ICD-10-CM

## 2022-03-12 DIAGNOSIS — M51369 Other intervertebral disc degeneration, lumbar region without mention of lumbar back pain or lower extremity pain: Secondary | ICD-10-CM

## 2022-03-12 DIAGNOSIS — R252 Cramp and spasm: Secondary | ICD-10-CM

## 2022-03-12 NOTE — Telephone Encounter (Signed)
Please, let pt know that her refill for Cymbalta was sent to mail order .Marland KitchenMarland Kitchen

## 2022-03-23 ENCOUNTER — Telehealth: Payer: Self-pay

## 2022-03-23 DIAGNOSIS — I10 Essential (primary) hypertension: Secondary | ICD-10-CM

## 2022-03-23 MED ORDER — VALSARTAN 160 MG PO TABS: 160.0000 mg | ORAL_TABLET | Freq: Every day | ORAL | 3 refills | Status: DC

## 2022-03-23 NOTE — Telephone Encounter (Signed)
Can send prescription valsartan '160mg'$  once a day. Which pharmacy?

## 2022-03-23 NOTE — Telephone Encounter (Signed)
Copied from [place]. Topic: General - Other >> Mar 23, 2022 11:30 AM Cyndi Bender wrote: Reason for CRM: Pt daughter [name] [name] stated the Rx for quinapril (ACCUPRIL) 40 MG tablet has been recalled and she would like to ask for an alternative mediation. Cb# [phone]

## 2022-03-23 NOTE — Telephone Encounter (Signed)
[name] [name] would like prescription sent to [facility]. Prescription sent.

## 2022-04-03 ENCOUNTER — Ambulatory Visit: Payer: Self-pay

## 2022-04-03 DIAGNOSIS — R413 Other amnesia: Secondary | ICD-10-CM

## 2022-04-03 DIAGNOSIS — I10 Essential (primary) hypertension: Secondary | ICD-10-CM

## 2022-04-03 NOTE — Chronic Care Management (AMB) (Signed)
     04/03/2022 Name: [name] [name] MRN: [id number] DOB: Nov 20, 1932  Subjective: [name] [name] is a 88 y.o. year old female who is a primary care patient of Fisher, Kirstie Peri, MD.   Documentation encounter created to complete case transition. All care goals addressed with patient's daughter/caregiver. The care management team will continue to follow for care coordination.   [facility] [phone]

## 2022-04-03 NOTE — Patient Instructions (Signed)
Thank you for allowing the Care Management team to participate in your care.     [facility] [phone]

## 2022-04-30 ENCOUNTER — Other Ambulatory Visit: Payer: Self-pay | Admitting: Family Medicine

## 2022-04-30 ENCOUNTER — Other Ambulatory Visit: Payer: Medicare HMO | Admitting: Student

## 2022-04-30 DIAGNOSIS — F0393 Unspecified dementia, unspecified severity, with mood disturbance: Secondary | ICD-10-CM

## 2022-04-30 DIAGNOSIS — R52 Pain, unspecified: Secondary | ICD-10-CM

## 2022-04-30 DIAGNOSIS — Z515 Encounter for palliative care: Secondary | ICD-10-CM

## 2022-04-30 DIAGNOSIS — I10 Essential (primary) hypertension: Secondary | ICD-10-CM

## 2022-04-30 NOTE — Progress Notes (Signed)
[facility] Palliative Care Consult Note Telephone: [phone]  Fax: [phone]    Date of encounter: 04/30/22 1:13 PM PATIENT NAME: [name] [name] [id number]   [phone] (home)  DOB: 15-Mar-1933 MRN: [id number] PRIMARY CARE PROVIDER:    Birdie Sons, MD,  [address] [place] [address] [phone]  REFERRING PROVIDER:   Birdie Sons, [place],  [place] [address] [phone]  RESPONSIBLE PARTY:    Contact Information     Name Relation Home Work Mobile   [name] [name],[name] [name] Daughter [phone]  [phone]        I met face to face with patient and family in the home. Palliative Care was asked to follow this patient by consultation request of  Fisher, Kirstie Peri, MD to address advance care planning and complex medical decision making. This is a follow up visit.                                   ASSESSMENT AND PLAN / RECOMMENDATIONS:   Advance Care Planning/Goals of Care: Goals include to maximize quality of life and symptom management. Patient/health care surrogate gave his/her permission to discuss. Our advance care planning conversation included a discussion about:    The value and importance of advance care planning  Experiences with loved ones who have been seriously ill or have died  Exploration of personal, cultural or spiritual beliefs that might influence medical decisions  Exploration of goals of care in the event of a sudden injury or illness   CODE STATUS: Full Code  Education provided on Palliative Medicine. Palliative will continue to provide supportive care, symptom management as needed. medications reviewed as patient feels she is taking too many medications.   Symptom Management/Plan:  Dementia-patient requires cueing and redirection. Patient is becoming more resistant with care, refusing medications at times. Patient does not feel that she  is needing more help. Daughter expresses caregiver fatigue. She is eating well. Uses walker for ambulation. Family to continue to provide supportive care. Monitor for cognitive and safety declines.    Pain-due to OA, DDD, neuropathy. Pain varies; she does not always take her medications. Continue gabapentin 100 mg each AM, 300 mg QHS, duloxetine routinely. Recommend acetaminophen BID.  Hypertension-continue amlodipine, furosemide and valsartan as directed.   Follow up Palliative Care Visit: Palliative care will continue to follow for complex medical decision making, advance care planning, and clarification of goals. Return in 8 weeks or prn.   This visit was coded based on medical decision making (MDM).  PPS: 50%  HOSPICE ELIGIBILITY/DIAGNOSIS: TBD  Chief Complaint: Palliative Medicine follow up visit.   HISTORY OF PRESENT ILLNESS:  [name] Suchecki is a 88 y.o. year old female  with dementia, frequent falls, weakness, urinary incontinence, T2DM, hypothyroidism, DDD, osteoarthritis.   Patient endorses leg pain. Appetite continues to be good.  Patient does report being forgetful. She feels she is not needing any more assistance while daughter expresses needing support. Patient forgets or refuses to take medications at times. No recent ED visits or hospitalizations. Patient agitated, reports being upset as she feels she is okay and does not need anyone to check on her. She is cooperative with assessment. She has not taken medications yet today; blood pressure elevated upon assessment. A 10-point review of systems is negative, except for the pertinent positives and negatives  detailed in the HPI.     History obtained from review of EMR, discussion with primary team, and interview with family, facility staff/caregiver and/or Ms. Gillen.  I reviewed available labs, medications, imaging, studies and related documents from the EMR.  Records reviewed and summarized above.    Physical Exam: Pulse  78, resp 16, b/p 162/80, sats 98% on room air Constitutional: NAD General: frail appearing EYES: anicteric sclera, lids intact, no discharge  ENMT: intact hearing, oral mucous membranes moist, dentition intact CV: S1S2, RRR, trace LE edema Pulmonary: LCTA, no increased work of breathing, no cough, room air Abdomen: normo-active BS + 4 quadrants, soft and non tender, no ascites GU: deferred MSK: moves all extremities, ambulatory with rollator Skin: warm and dry, no rashes or wounds on visible skin Neuro:  no generalized weakness,  A & O x 2 Psych: non-anxious affect Hem/lymph/immuno: no widespread bruising   Thank you for the opportunity to participate in the care of Ms. Doetsch.  The palliative care team will continue to follow. Please call our office at [phone] if we can be of additional assistance.   Ezekiel Slocumb, NP   COVID-19 PATIENT SCREENING TOOL Asked and negative response unless otherwise noted:   Have you had symptoms of covid, tested positive or been in contact with someone with symptoms/positive test in the past 5-10 days? No

## 2022-05-25 ENCOUNTER — Other Ambulatory Visit: Payer: Self-pay | Admitting: Family Medicine

## 2022-07-06 ENCOUNTER — Other Ambulatory Visit: Payer: Self-pay | Admitting: Family Medicine

## 2022-07-06 DIAGNOSIS — E1149 Type 2 diabetes mellitus with other diabetic neurological complication: Secondary | ICD-10-CM

## 2022-07-09 ENCOUNTER — Other Ambulatory Visit: Payer: Medicare HMO | Admitting: Student

## 2022-07-09 DIAGNOSIS — R52 Pain, unspecified: Secondary | ICD-10-CM

## 2022-07-09 DIAGNOSIS — Z515 Encounter for palliative care: Secondary | ICD-10-CM

## 2022-07-09 DIAGNOSIS — F0393 Unspecified dementia, unspecified severity, with mood disturbance: Secondary | ICD-10-CM

## 2022-07-09 DIAGNOSIS — J302 Other seasonal allergic rhinitis: Secondary | ICD-10-CM

## 2022-07-09 NOTE — Progress Notes (Signed)
[facility] Palliative Care Consult Note Telephone: [phone]  Fax: [phone]    Date of encounter: 07/09/22 1:00 PM PATIENT NAME: [name] [place] [address]   [phone] (home)  DOB: [birth year] MRN: [id number] PRIMARY CARE PROVIDER:    Birdie Sons, MD,  [address] [place] [address] [phone]  REFERRING PROVIDER:   Birdie Sons, [place],  [place] [address] [phone]  RESPONSIBLE PARTY:    Contact Information     Name Relation Home Work Mobile   [name],Teresa Daughter [phone]  [phone]        I met face to face with patient and family in the home. Palliative Care was asked to follow this patient by consultation request of  Fisher, Kirstie Peri, MD to address advance care planning and complex medical decision making. This is a follow up visit.                                   ASSESSMENT AND PLAN / RECOMMENDATIONS:   Advance Care Planning/Goals of Care: Goals include to maximize quality of life and symptom management. Patient/health care surrogate gave his/her permission to discuss. Our advance care planning conversation included a discussion about:    The value and importance of advance care planning  Experiences with loved ones who have been seriously ill or have died  Exploration of personal, cultural or spiritual beliefs that might influence medical decisions  Exploration of goals of care in the event of a sudden injury or illness. Decision not to resuscitate or to de-escalate disease focused treatments due to poor prognosis. CODE STATUS: DNR  Education provided on Palliative Medicine. DNR completed today.   Symptom Management/Plan:  Dementia-patient requires cueing and redirection. She is resistant to care at times, refusing medications. She is eating well. Uses walker for ambulation. Family to continue to provide supportive care.  Monitor for cognitive and safety declines.    Pain-due to OA, DDD, neuropathy. Pain has been managed. Continue gabapentin 100 mg each AM, 300 mg QHS, duloxetine routinely, acetaminophen BID.  Allergic rhinitis-symptoms consistent with allergic rhinitis. Restart Flonase 2 sprays in each nostril daily.  Follow up Palliative Care Visit: Palliative care will continue to follow for complex medical decision making, advance care planning, and clarification of goals. Return prn.  I spent 60 minutes providing this consultation. More than 50% of the time in this consultation was spent in counseling and care coordination.   PPS: 50%  HOSPICE ELIGIBILITY/DIAGNOSIS: TBD  Chief Complaint: Palliative Medicine follow up visit.   HISTORY OF PRESENT ILLNESS:  [name] [name] is a 88 y.o. year old female  with dementia, frequent falls, weakness, urinary incontinence, T2DM, hypothyroidism, DDD, osteoarthritis.   Patient reports doing well. Endorses leg pain. Has occasional cough, clear nasal drainage. She uses walker for ambulation, no falls reported. Appetite is good. No recent infections, ED visits or hospitalizations.   History obtained from review of EMR, discussion with primary team, and interview with family, facility staff/caregiver and/or Ms. Gubler.  I reviewed available labs, medications, imaging, studies and related documents from the EMR.  Records reviewed and summarized above.   ROS  A 10-point review of systems is negative, except for the pertinent positives and negatives detailed in the HPI.    Physical Exam: Pulse 84, resp 16, sats 97% on room air Constitutional: NAD  General: frail appearing EYES: anicteric sclera, lids intact, no discharge  ENMT: intact hearing, oral mucous membranes moist CV: S1S2, RRR, trace LE edema Pulmonary: LCTA, no increased work of breathing, no cough, room air Abdomen: normo-active BS + 4 quadrants, soft and non tender, no ascites MSK: moves all  extremities, ambulatory with cane Skin: warm and dry, no rashes or wounds on visible skin Neuro:  + generalized weakness,  + cognitive impairment Psych: agitated, A and O x 2 Hem/lymph/immuno: no widespread bruising   Thank you for the opportunity to participate in the care of Ms. Diskin.  The palliative care team will continue to follow. Please call our office at [phone] if we can be of additional assistance.   Ezekiel Slocumb, NP   COVID-19 PATIENT SCREENING TOOL Asked and negative response unless otherwise noted:   Have you had symptoms of covid, tested positive or been in contact with someone with symptoms/positive test in the past 5-10 days? No

## 2022-07-12 ENCOUNTER — Telehealth: Payer: Self-pay | Admitting: Family Medicine

## 2022-07-12 DIAGNOSIS — E1149 Type 2 diabetes mellitus with other diabetic neurological complication: Secondary | ICD-10-CM

## 2022-07-12 DIAGNOSIS — I10 Essential (primary) hypertension: Secondary | ICD-10-CM

## 2022-07-12 NOTE — Telephone Encounter (Signed)
She's not been seen since November 2022, needs to schedule o.v. before refill can be approved.

## 2022-07-13 NOTE — Telephone Encounter (Signed)
Requested medications are due for refill today.  yes  Requested medications are on the active medications list.  yes  Last refill.   Future visit scheduled.   yes  Notes to clinic.  Labs are expired.    Requested Prescriptions  Pending Prescriptions Disp Refills   furosemide (LASIX) 20 MG tablet [Pharmacy Med Name: FUROSEMIDE 20 MG Tablet] 90 tablet 10    Sig: TAKE 1 TABLET EVERY DAY     Cardiovascular:  Diuretics - Loop Failed - 07/12/2022  3:38 AM      Failed - K in normal range and within 180 days    Potassium  Date Value Ref Range Status  11/05/2020 4.6 3.5 - 5.2 mmol/L Final         Failed - Ca in normal range and within 180 days    Calcium  Date Value Ref Range Status  11/05/2020 10.2 8.7 - 10.3 mg/dL Final         Failed - Na in normal range and within 180 days    Sodium  Date Value Ref Range Status  11/05/2020 139 134 - 144 mmol/L Final         Failed - Cr in normal range and within 180 days    Creat  Date Value Ref Range Status  07/06/2017 0.90 (H) 0.60 - 0.88 mg/dL Final    Comment:    For patients >73 years of age, the reference limit for Creatinine is approximately 13% higher for people identified as African-American. .    Creatinine, Ser  Date Value Ref Range Status  11/05/2020 0.89 0.57 - 1.00 mg/dL Final    Comment:                   **Effective November 11, 2020 [facility] will begin**                  reporting the 2021 CKD-EPI creatinine equation that                  estimates kidney function without a race variable.    Creatinine, POC  Date Value Ref Range Status  02/26/2017 n/a mg/dL Final         Failed - Cl in normal range and within 180 days    Chloride  Date Value Ref Range Status  11/05/2020 98 96 - 106 mmol/L Final         Failed - Mg Level in normal range and within 180 days    No results found for: "MG"       Failed - Valid encounter within last 6 months    Recent Outpatient Visits           11 months ago Memory  change   [facility] Birdie Sons, MD   12 months ago No-show for appointment   [facility] Birdie Sons, MD   1 year ago Leg cramps   [facility] Birdie Sons, MD   1 year ago Primary hypertension   [place], Vickki Muff, PA-C   1 year ago Primary hypertension   [facility] Birdie Sons, MD       Future Appointments             In 2 weeks Caryn Section, Kirstie Peri, MD [facility]            Passed - Last BP in normal range    BP Readings from  Last 1 Encounters:  05/05/21 (!) 134/57          glipiZIDE (GLUCOTROL XL) 10 MG 24 hr tablet [Pharmacy Med Name: GLIPIZIDE ER 10 MG Tablet Extended Release 24 Hour] 90 tablet 10    Sig: TAKE 1 TABLET EVERY DAY     Endocrinology:  Diabetes - Sulfonylureas Failed - 07/12/2022  3:38 AM      Failed - HBA1C is between 0 and 7.9 and within 180 days    Hemoglobin A1C  Date Value Ref Range Status  05/05/2021 6.5 (A) 4.0 - 5.6 % Final   Hgb A1c MFr Bld  Date Value Ref Range Status  11/05/2020 6.6 (H) 4.8 - 5.6 % Final    Comment:             Prediabetes: 5.7 - 6.4          Diabetes: >6.4          Glycemic control for adults with diabetes: <7.0          Failed - Cr in normal range and within 360 days    Creat  Date Value Ref Range Status  07/06/2017 0.90 (H) 0.60 - 0.88 mg/dL Final    Comment:    For patients >71 years of age, the reference limit for Creatinine is approximately 13% higher for people identified as African-American. .    Creatinine, Ser  Date Value Ref Range Status  11/05/2020 0.89 0.57 - 1.00 mg/dL Final    Comment:                   **Effective November 11, 2020 [facility] will begin**                  reporting the 2021 CKD-EPI creatinine equation that                  estimates kidney function without a race variable.    Creatinine, POC  Date Value Ref Range Status  02/26/2017 n/a mg/dL Final          Failed - Valid encounter within last 6 months    Recent Outpatient Visits           11 months ago Memory change   [facility] Birdie Sons, MD   12 months ago No-show for appointment   [facility] Birdie Sons, MD   1 year ago Leg cramps   [facility] Birdie Sons, MD   1 year ago Primary hypertension   [place], Vickki Muff, PA-C   1 year ago Primary hypertension   [facility] Birdie Sons, MD       Future Appointments             In 2 weeks Fisher, Kirstie Peri, MD [facility], [place]

## 2022-07-13 NOTE — Telephone Encounter (Signed)
Called pt - s/w daughter  - made follow up appt.

## 2022-07-27 ENCOUNTER — Ambulatory Visit: Payer: Medicare HMO | Admitting: Family Medicine

## 2022-08-07 ENCOUNTER — Other Ambulatory Visit: Payer: Self-pay | Admitting: Physician Assistant

## 2022-08-07 DIAGNOSIS — M5136 Other intervertebral disc degeneration, lumbar region: Secondary | ICD-10-CM

## 2022-08-07 DIAGNOSIS — M159 Polyosteoarthritis, unspecified: Secondary | ICD-10-CM

## 2022-08-07 DIAGNOSIS — R252 Cramp and spasm: Secondary | ICD-10-CM

## 2022-08-10 NOTE — Telephone Encounter (Signed)
Requested medication (s) are due for refill today: yes  Requested medication (s) are on the active medication list: yes    Last refill: 03/12/22  #90 0 refills  Future visit scheduled yes 08/17/22  Notes to clinic:Please review. Several no shows and cancellations. Pt has has 08/17/22  Requested Prescriptions  Pending Prescriptions Disp Refills   DULoxetine (CYMBALTA) 30 MG capsule [Pharmacy Med Name: DULOXETINE HCL 30 MG Capsule Delayed Release Particles] 90 capsule 3    Sig: TAKE 1 CAPSULE EVERY DAY     Psychiatry: Antidepressants - SNRI - duloxetine Failed - 08/07/2022  4:34 AM      Failed - Cr in normal range and within 360 days    Creat  Date Value Ref Range Status  07/06/2017 0.90 (H) 0.60 - 0.88 mg/dL Final    Comment:    For patients >51 years of age, the reference limit for Creatinine is approximately 13% higher for people identified as African-American. .    Creatinine, Ser  Date Value Ref Range Status  11/05/2020 0.89 0.57 - 1.00 mg/dL Final    Comment:                   **Effective November 11, 2020 [facility] will begin**                  reporting the 2021 CKD-EPI creatinine equation that                  estimates kidney function without a race variable.    Creatinine, POC  Date Value Ref Range Status  02/26/2017 n/a mg/dL Final         Failed - eGFR is 30 or above and within 360 days    GFR, Est African American  Date Value Ref Range Status  07/06/2017 69 > OR = 60 mL/min/1.5m Final   GFR calc Af Amer  Date Value Ref Range Status  11/05/2020 68 >59 mL/min/1.73 Final    Comment:    **In accordance with recommendations from the NKF-ASN Task force,**   [facility] is in the process of updating its eGFR calculation to the   2021 CKD-EPI creatinine equation that estimates kidney function   without a race variable.    GFR, Est Non African American  Date Value Ref Range Status  07/06/2017 60 > OR = 60 mL/min/1.767mFinal   GFR calc non Af Amer  Date Value  Ref Range Status  11/05/2020 59 (L) >59 mL/min/1.73 Final         Failed - Valid encounter within last 6 months    Recent Outpatient Visits           1 year ago Memory [facility]   1 year ago [facility]show for appointment   [facility]   1 year [facility] Leg cramps   [facility]   1 year ago Primary hypertension   [facility]   1 year ago Primary hypertension   [facility]       Future Appointments             In 1 week Fisher, Do[facility]irstie [facility] Completed PHQ-2 or PHQ-9 in the last 360 days      Passed - Last BP  in normal range    BP Readings from Last 1 Encounters:  05/05/21 (!) 134/57

## 2022-08-14 NOTE — Progress Notes (Deleted)
Established patient visit   Patient: [name] [name]   DOB: [birth year]   88 y.o. Female  MRN: [id number] Visit Date: 08/17/2022  Today's healthcare provider: Lelon Huh, MD   No chief complaint on file.  Subjective    HPI  Diabetes Mellitus Type II, follow-up  Lab Results  Component Value Date   HGBA1C 6.5 (A) 05/05/2021   HGBA1C 6.6 (H) 11/05/2020   HGBA1C 6.9 (A) 06/21/2020   Last seen for diabetes 3 months ago.  Management since then includes continuing the same treatment.  Home blood sugar records: {diabetes glucometry results:16657} Most Recent Eye Exam: ***  --------------------------------------------------------------------------------------------------- Hypertension, follow-up  BP Readings from Last 3 Encounters:  05/05/21 (!) 134/57  11/05/20 124/74  06/21/20 (!) 146/74   Wt Readings from Last 3 Encounters:  05/05/21 182 lb (82.6 kg)  11/05/20 177 lb (80.3 kg)  06/21/20 183 lb (83 kg)     She was last seen for hypertension 3 months ago.  Management since that visit includes; Good BP control with use of Amlodipine 2.5 mg qd, Lasix 20 mg qd and Quinapril 40 mg qd. .  Outside blood pressures are {enter patient reported home BP, or 'not being checked':1}.  ---------------------------------------------------------------------------------------------------   Medications: Outpatient Medications Prior to Visit  Medication Sig   Accu-Chek Softclix Lancets lancets USE TO CHECK BLOOD SUGAR ONCE A DAY   Alcohol Swabs (DROPSAFE ALCOHOL PREP) 70 % PADS USE TO TEST BLOOD SUGAR EVERY DAY   ALPRAZolam (XANAX) 0.25 MG tablet Take 1 tablet (0.25 mg total) by mouth every 6 (six) hours as needed for anxiety. (Patient not taking: Reported on 08/04/2021)   amLODipine (NORVASC) 2.5 MG tablet Take 1 tablet (2.5 mg total) by mouth daily. (Patient taking differently: Take 5 mg by mouth daily.)   amLODipine (NORVASC) 5 MG tablet TAKE 1 TABLET EVERY DAY   ascorbic acid  (VITAMIN C) 500 MG tablet Take 500 mg by mouth daily. (Patient not taking: Reported on 08/04/2021)   aspirin 81 MG tablet Take 1 tablet by mouth daily. (Patient not taking: Reported on 09/01/2021)   Blood Glucose Monitoring Suppl (ACCU-CHEK AVIVA PLUS) w/Device KIT USE TO CHECK BLOOD SUGAR ONCE A DAY   Blood Pressure Monitoring (BLOOD PRESSURE MONITOR/ARM) DEVI Use to check blood pressure of upper arm daily as needed for hypertension (I10)   cyclobenzaprine (FLEXERIL) 5 MG tablet Take 1 tablet (5 mg total) by mouth 3 (three) times daily as needed for muscle spasms. Take in place of tizanidine   DULoxetine (CYMBALTA) 30 MG capsule TAKE 1 CAPSULE EVERY DAY   ezetimibe (ZETIA) 10 MG tablet TAKE 1 TABLET EVERY DAY   fluticasone (FLONASE) 50 MCG/ACT nasal spray PLACE 2 SPRAYS INTO BOTH NOSTRILS DAILY.   furosemide (LASIX) 20 MG tablet TAKE 1 TABLET EVERY DAY   gabapentin (NEURONTIN) 100 MG capsule TAKE 1 CAPSULE EVERY MORNING   gabapentin (NEURONTIN) 300 MG capsule TAKE 1 CAPSULE (300 MG TOTAL) BY MOUTH AT BEDTIME. IN ADDITION TO THE 100MG CAPSULE IN THE MORNING   glipiZIDE (GLUCOTROL XL) 10 MG 24 hr tablet TAKE 1 TABLET EVERY DAY   glucose blood (ACCU-CHEK AVIVA PLUS) test strip TEST BLOOD SUGAR ONE TIME DAILY   levothyroxine (SYNTHROID) 75 MCG tablet TAKE 1 TABLET BY MOUTH EVERY DAY   metFORMIN (GLUCOPHAGE-XR) 750 MG 24 hr tablet TAKE 1 TABLET TWICE DAILY   Multiple Vitamins-Minerals (DAILY MULTIVITAMIN PO) Take 1 tablet by mouth.   Omega-3 Fatty Acids (FISH OIL) 1000 MG  CAPS Take 1 capsule by mouth daily. (Patient not taking: Reported on 11/05/2020)   oxybutynin (DITROPAN) 5 MG tablet TAKE 1 TABLET 2 TO 3 TIMES DAILY AS NEEDED FOR  URINARY FREQUENCY   tizanidine (ZANAFLEX) 2 MG capsule Take 1-2 capsules (2-4 mg total) by mouth at bedtime as needed for muscle spasms.   valsartan (DIOVAN) 160 MG tablet Take 1 tablet (160 mg total) by mouth daily.   No facility-administered medications prior to visit.     Review of Systems  Constitutional:  Negative for appetite change, chills, fatigue and fever.  Respiratory:  Negative for chest tightness and shortness of breath.   Cardiovascular:  Negative for chest pain and palpitations.  Gastrointestinal:  Negative for abdominal pain, nausea and vomiting.  Neurological:  Negative for dizziness and weakness.    {Labs  Heme  Chem  Endocrine  Serology  Results Review (optional):23779}   Objective    There were no vitals taken for this visit. {Show previous vital signs (optional):23777}  Physical Exam  ***  No results found for any visits on 08/17/22.  Assessment & Plan     ***  No follow-ups on file.      {provider attestation***:1}   Lelon Huh, MD  [facility] [phone] (phone) [phone] (fax)  [place]

## 2022-08-16 ENCOUNTER — Other Ambulatory Visit: Payer: Self-pay | Admitting: Family Medicine

## 2022-08-17 ENCOUNTER — Encounter: Payer: Self-pay | Admitting: Family Medicine

## 2022-08-17 ENCOUNTER — Ambulatory Visit: Payer: Medicare HMO | Admitting: Family Medicine

## 2022-08-17 ENCOUNTER — Ambulatory Visit (INDEPENDENT_AMBULATORY_CARE_PROVIDER_SITE_OTHER): Payer: Medicare HMO | Admitting: Family Medicine

## 2022-08-17 DIAGNOSIS — J301 Allergic rhinitis due to pollen: Secondary | ICD-10-CM

## 2022-08-17 NOTE — Progress Notes (Signed)
      Virtual Visit via Telephone Note   This format is felt to be most appropriate for this patient at this time. The patient did not have access to video technology or had technical difficulties with video requiring transitioning to audio format only (telephone). Physical exam was limited to content and character of the telephone converstion.    Patient location: home Provider location: [facility]  I discussed the limitations of evaluation and management by telemedicine and the availability of in person appointments. The patient expressed understanding and agreed to proceed.   Visit Date: 08/17/2022  Today's healthcare provider: Lelon Huh, MD   No chief complaint on file.  Subjective    HPI  Briefly talked to patient's daughter on phone. Appt was for follow up of chronic conditions but patient has not been seen in office since August 2022 and had no labs done since February 2022. Advised needs in-office appointment which is scheduled for 12/6/202 at [place], MD [facility] [phone] (phone) [phone] (fax)  [place]

## 2022-08-19 ENCOUNTER — Ambulatory Visit (INDEPENDENT_AMBULATORY_CARE_PROVIDER_SITE_OTHER): Payer: Medicare HMO | Admitting: Family Medicine

## 2022-08-19 ENCOUNTER — Encounter: Payer: Self-pay | Admitting: Family Medicine

## 2022-08-19 VITALS — BP 160/64 | HR 73 | Temp 98.5°F | Resp 18 | Ht 65.0 in | Wt 183.0 lb

## 2022-08-19 DIAGNOSIS — I10 Essential (primary) hypertension: Secondary | ICD-10-CM

## 2022-08-19 DIAGNOSIS — R296 Repeated falls: Secondary | ICD-10-CM | POA: Diagnosis not present

## 2022-08-19 DIAGNOSIS — H35033 Hypertensive retinopathy, bilateral: Secondary | ICD-10-CM | POA: Diagnosis not present

## 2022-08-19 DIAGNOSIS — E1149 Type 2 diabetes mellitus with other diabetic neurological complication: Secondary | ICD-10-CM | POA: Diagnosis not present

## 2022-08-19 DIAGNOSIS — E559 Vitamin D deficiency, unspecified: Secondary | ICD-10-CM

## 2022-08-19 DIAGNOSIS — E039 Hypothyroidism, unspecified: Secondary | ICD-10-CM | POA: Diagnosis not present

## 2022-08-19 DIAGNOSIS — E785 Hyperlipidemia, unspecified: Secondary | ICD-10-CM | POA: Diagnosis not present

## 2022-08-19 DIAGNOSIS — Z23 Encounter for immunization: Secondary | ICD-10-CM | POA: Diagnosis not present

## 2022-08-19 DIAGNOSIS — E538 Deficiency of other specified B group vitamins: Secondary | ICD-10-CM

## 2022-08-19 NOTE — Patient Instructions (Signed)
.   Please review the attached list of medications and notify my office if there are any errors.   . Please bring all of your medications to every appointment so we can make sure that our medication list is the same as yours.   

## 2022-08-19 NOTE — Progress Notes (Signed)
I,Joseline E Rosas,acting as a scribe for Lelon Huh, MD.,have documented all relevant documentation on the behalf of Lelon Huh, MD,as directed by  Lelon Huh, MD while in the presence of Lelon Huh, MD.   Established patient visit   Patient: [name] [name]   DOB: November 02, 1932   88 y.o. Female  MRN: [id number] Visit Date: 08/19/2022  Today's healthcare provider: Lelon Huh, MD    Subjective    HPI  Diabetes Mellitus Type II, follow-up  Lab Results  Component Value Date   HGBA1C 6.5 (A) 05/05/2021   HGBA1C 6.6 (H) 11/05/2020   HGBA1C 6.9 (A) 06/21/2020    Home blood sugar records: fasting range: not being checked   Most Recent Eye Exam: scheduled for 11/2022  --------------------------------------------------------------------------------------------------- Hypertension, follow-up  BP Readings from Last 3 Encounters:  08/19/22 (!) 160/64  05/05/21 (!) 134/57  11/05/20 124/74   Wt Readings from Last 3 Encounters:  08/19/22 183 lb (83 kg)  05/05/21 182 lb (82.6 kg)  11/05/20 177 lb (80.3 kg)      Outside blood pressures are not being checked.  Lipid/Cholesterol, follow-up  Last Lipid Panel: Lab Results  Component Value Date   CHOL 210 (H) 11/05/2020   LDLCALC 151 (H) 11/05/2020   HDL 41 11/05/2020   TRIG 98 11/05/2020    ---------------------------------------------------------------------------------------------------   Her daughter also reports she has been forgetful and confused occasionally, and gets agitated very easily. She has also had trouble with her balance and had several falls, in additional to not being able to bath herself.  She had been prescribed Aricept in the past which apparently made her agitated.    Medications: Outpatient Medications Prior to Visit  Medication Sig   Accu-Chek Softclix Lancets lancets USE TO CHECK BLOOD SUGAR ONCE A DAY   Alcohol Swabs (DROPSAFE ALCOHOL PREP) 70 % PADS USE TO TEST BLOOD SUGAR EVERY  DAY   ALPRAZolam (XANAX) 0.25 MG tablet Take 1 tablet (0.25 mg total) by mouth every 6 (six) hours as needed for anxiety. (Patient not taking: Reported on 08/04/2021)   amLODipine (NORVASC) 2.5 MG tablet Take 1 tablet (2.5 mg total) by mouth daily. (Patient taking differently: Take 5 mg by mouth daily.)   amLODipine (NORVASC) 5 MG tablet TAKE 1 TABLET EVERY DAY   Blood Glucose Monitoring Suppl (ACCU-CHEK AVIVA PLUS) w/Device KIT USE TO CHECK BLOOD SUGAR ONCE A DAY   cyclobenzaprine (FLEXERIL) 5 MG tablet Take 1 tablet (5 mg total) by mouth 3 (three) times daily as needed for muscle spasms. Take in place of tizanidine (Patient not taking: Reported on 08/17/2022)   DULoxetine (CYMBALTA) 30 MG capsule TAKE 1 CAPSULE EVERY DAY   ezetimibe (ZETIA) 10 MG tablet TAKE 1 TABLET EVERY DAY (Patient not taking: Reported on 08/17/2022)   fluticasone (FLONASE) 50 MCG/ACT nasal spray PLACE 2 SPRAYS INTO BOTH NOSTRILS DAILY.   furosemide (LASIX) 20 MG tablet TAKE 1 TABLET EVERY DAY   gabapentin (NEURONTIN) 100 MG capsule TAKE 1 CAPSULE EVERY MORNING   gabapentin (NEURONTIN) 300 MG capsule TAKE 1 CAPSULE (300 MG TOTAL) BY MOUTH AT BEDTIME. IN ADDITION TO THE 100MG CAPSULE IN THE MORNING   glipiZIDE (GLUCOTROL XL) 10 MG 24 hr tablet TAKE 1 TABLET EVERY DAY   glucose blood (ACCU-CHEK AVIVA PLUS) test strip TEST BLOOD SUGAR ONE TIME DAILY   levothyroxine (SYNTHROID) 75 MCG tablet TAKE 1 TABLET BY MOUTH EVERY DAY   metFORMIN (GLUCOPHAGE-XR) 750 MG 24 hr tablet TAKE 1 TABLET TWICE DAILY  Multiple Vitamins-Minerals (DAILY MULTIVITAMIN PO) Take 1 tablet by mouth.   oxybutynin (DITROPAN) 5 MG tablet TAKE 1 TABLET 2 TO 3 TIMES DAILY AS NEEDED FOR  URINARY FREQUENCY   tizanidine (ZANAFLEX) 2 MG capsule Take 1-2 capsules (2-4 mg total) by mouth at bedtime as needed for muscle spasms. (Patient not taking: Reported on 08/17/2022)   valsartan (DIOVAN) 160 MG tablet Take 1 tablet (160 mg total) by mouth daily.   No  facility-administered medications prior to visit.    Review of Systems  Constitutional:  Positive for fatigue. Negative for appetite change, chills and fever.  Respiratory:  Negative for chest tightness and shortness of breath.   Cardiovascular:  Negative for chest pain and palpitations.  Gastrointestinal:  Negative for abdominal pain, nausea and vomiting.  Neurological:  Positive for weakness. Negative for dizziness.       Objective    BP (!) 160/64 (BP Location: Right Arm, Patient Position: Sitting, Cuff Size: Large)   Pulse 73   Temp 98.5 F (36.9 C) (Oral)   Resp 18   Ht _0  (1.651 m)   Wt 183 lb (83 kg)   SpO2 96%   BMI 30.45 kg/m    Physical Exam   General: Appearance:    Obese female in no acute distress. Discouraged affect.   Eyes:    PERRL, conjunctiva/corneas clear, EOM's intact       Lungs:     Clear to auscultation bilaterally, respirations unlabored  Heart:    Normal heart rate. Normal rhythm. No murmurs, rubs, or gallops.    MS:   All extremities are intact.  Requires assistance to stand and walk.   Neurologic:   Awake, alert, oriented x 2. No apparent focal neurological defect.         Assessment & Plan     1. Diabetes mellitus type 2 with neurological manifestations ([place])  - Hemoglobin A1c  2. Primary hypertension Not at goal, compliant with medications. May be related to overall agitation. Anticipating trial of demential medications.   3. Hyperlipidemia, unspecified hyperlipidemia type Intolerant to multiple statins. On ezetimibe.  - Lipid panel  4. B12 deficiency  - Vitamin B12  5. Hypothyroidism, unspecified type  - T4, free - TSH  6. Hypertensive retinopathy of both eyes  - CBC - Comprehensive metabolic panel   7. Vitamin D deficiency  - VITAMIN D 25 Hydroxy (Vit-D Deficiency, Fractures)  8. Multiple falls Home health referral for Physical therapy. She is unable to bath herself and has difficulty with activities of daily  living and required home health aid.   9. Need for influenza vaccination  - Flu Vaccine QUAD High Dose(Fluad)  10. Need for COVID-19 vaccine . - PFIZER Comirnaty(GRAY TOP)COVID-19 Vaccine      The entirety of the information documented in the History of Present Illness, Review of Systems and Physical Exam were personally obtained by me. Portions of this information were initially documented by the CMA and reviewed by me for thoroughness and accuracy.     Lelon Huh, MD  [facility] [phone] (phone) [phone] (fax)  [place]

## 2022-08-20 ENCOUNTER — Telehealth: Payer: Self-pay | Admitting: *Deleted

## 2022-08-20 DIAGNOSIS — R413 Other amnesia: Secondary | ICD-10-CM

## 2022-08-20 LAB — CBC
Hematocrit: 39.4 % (ref 34.0–46.6)
Hemoglobin: 12.6 g/dL (ref 11.1–15.9)
MCH: 27.2 pg (ref 26.6–33.0)
MCHC: 32 g/dL (ref 31.5–35.7)
MCV: 85 fL (ref 79–97)
Platelets: 311 10*3/uL (ref 150–450)
RBC: 4.63 x10E6/uL (ref 3.77–5.28)
RDW: 13.7 % (ref 11.7–15.4)
WBC: 5.8 10*3/uL (ref 3.4–10.8)

## 2022-08-20 LAB — LIPID PANEL
Chol/HDL Ratio: 5 ratio — ABNORMAL HIGH (ref 0.0–4.4)
Cholesterol, Total: 195 mg/dL (ref 100–199)
HDL: 39 mg/dL — ABNORMAL LOW (ref >39)
LDL Chol Calc (NIH): 133 mg/dL — ABNORMAL HIGH (ref 0–99)
Triglycerides: 126 mg/dL (ref 0–149)
VLDL Cholesterol Cal: 23 mg/dL (ref 5–40)

## 2022-08-20 LAB — VITAMIN B12: Vitamin B-12: 344 pg/mL (ref 232–1245)

## 2022-08-20 LAB — COMPREHENSIVE METABOLIC PANEL
ALT: 23 IU/L (ref 0–32)
AST: 29 IU/L (ref 0–40)
Albumin/Globulin Ratio: 1.1 — ABNORMAL LOW (ref 1.2–2.2)
Albumin: 4 g/dL (ref 3.7–4.7)
Alkaline Phosphatase: 54 IU/L (ref 44–121)
BUN/Creatinine Ratio: 12 (ref 12–28)
BUN: 11 mg/dL (ref 8–27)
Bilirubin Total: 0.6 mg/dL (ref 0.0–1.2)
CO2: 25 mmol/L (ref 20–29)
Calcium: 9.8 mg/dL (ref 8.7–10.3)
Chloride: 103 mmol/L (ref 96–106)
Creatinine, Ser: 0.93 mg/dL (ref 0.57–1.00)
Globulin, Total: 3.5 g/dL (ref 1.5–4.5)
Glucose: 118 mg/dL — ABNORMAL HIGH (ref 70–99)
Potassium: 4.1 mmol/L (ref 3.5–5.2)
Sodium: 142 mmol/L (ref 134–144)
Total Protein: 7.5 g/dL (ref 6.0–8.5)
eGFR: 59 mL/min/{1.73_m2} — ABNORMAL LOW (ref >59)

## 2022-08-20 LAB — HEMOGLOBIN A1C
Est. average glucose Bld gHb Est-mCnc: 163 mg/dL
Hgb A1c MFr Bld: 7.3 % — ABNORMAL HIGH (ref 4.8–5.6)

## 2022-08-20 LAB — VITAMIN D 25 HYDROXY (VIT D DEFICIENCY, FRACTURES): Vit D, 25-Hydroxy: 37.2 ng/mL (ref 30.0–100.0)

## 2022-08-20 LAB — T4, FREE: Free T4: 1.15 ng/dL (ref 0.82–1.77)

## 2022-08-20 LAB — TSH: TSH: 0.956 u[IU]/mL (ref 0.450–4.500)

## 2022-08-20 MED ORDER — RIVASTIGMINE TARTRATE 1.5 MG PO CAPS: ORAL_CAPSULE | ORAL | 1 refills | Status: DC

## 2022-08-20 NOTE — Telephone Encounter (Signed)
Have sent prescription for Exelon to her pharmacy, please schedule a follow up in 1 month

## 2022-08-20 NOTE — Telephone Encounter (Signed)
Patient's daughter [name] wanted to know if there is a medication to help with patient's symptoms of dementia? Or what provider recommendeds? [name] states patient is at times combative, will not sleep at night, and believes someone is steeling her things. [name] doesn't want to place patient in a care facility at this point. However, she feels it could be unsafe for patient to be alone with her spouse (who is bed ridden). Please advise?

## 2022-08-21 ENCOUNTER — Encounter: Payer: Self-pay | Admitting: Family Medicine

## 2022-08-21 NOTE — Telephone Encounter (Signed)
[name] [name] was advised and appt scheduled.

## 2022-08-25 ENCOUNTER — Encounter: Payer: Self-pay | Admitting: Family Medicine

## 2022-08-27 MED ORDER — TIZANIDINE HCL 2 MG PO TABS: 4.0000 mg | ORAL_TABLET | Freq: Three times a day (TID) | ORAL | 1 refills | Status: AC | PRN

## 2022-08-28 ENCOUNTER — Ambulatory Visit: Payer: Self-pay

## 2022-08-28 ENCOUNTER — Encounter: Payer: Self-pay | Admitting: Family Medicine

## 2022-08-28 ENCOUNTER — Ambulatory Visit: Payer: Self-pay | Admitting: *Deleted

## 2022-08-28 NOTE — Telephone Encounter (Signed)
Triaged earlier today and instructed to go to the ED.   Daughter [name] callling in.    I can't get my mother to get out of the house.    She refusing to go to the ED.   Reason for Disposition  Health Information question, no triage required and triager able to answer question    Pt refusing ED deposition.  Answer Assessment - Initial Assessment Questions 1. REASON FOR CALL or QUESTION: "What is your reason for calling today?" or "How can I best help you?" or "What question do you have that I can help answer?"     Daughter [name] calling in.   Her mother was triaged earlier today and instructed to go to the ED.   "I can't get my mother out of the house".    "She won't go to the hospital".    "I don't know what to do".    [name] was asking for an appt. However I let her know that the level of care she needed from being triaged earlier was to go to the ED then I could not make her an appt.   [name] verbalized understanding.    "I'm going to let her know I called her dr. And he said she needed to go to the ED.   Maybe that will convince her".      [name] thanked me for my help.  I forwarded this information to [facility] for Dr. Caryn Section.  Protocols used: Information Only Call - No Triage-A-AH

## 2022-08-28 NOTE — Telephone Encounter (Signed)
  Chief Complaint: fell last night Symptoms: right lower back near hip area- palm sized area of abrasion and bruising to each, weakness Frequency: last night Pertinent Negatives: no arm drift, smile is even, denies numbness to face, arms or legs Disposition: '[x]'$ ED /'[]'$ Urgent Care (no appt availability in office) / '[]'$ Appointment(In office/virtual)/ '[]'$  [facility]/ '[]'$ Home Care/ '[]'$ Refused Recommended Disposition /'[]'$ [facility]/ '[]'$  Follow-up with PCP Additional Notes: daughter is going to take pt to ED-  Reason for Disposition  [1] MODERATE weakness (i.e., interferes with work, school, normal activities) AND [2] new-onset or worsening  Answer Assessment - Initial Assessment Questions 1. MECHANISM: "How did the fall happen?"     Getting up to BR and dark and tripped over an object  2. DOMESTIC VIOLENCE AND ELDER ABUSE SCREENING: "Did you fall because someone pushed you or tried to hurt you?" If Yes, ask: "Are you safe now?"     no 3. ONSET: "When did the fall happen?" (e.g., minutes, hours, or days ago)     yesterday 4. LOCATION: "What part of the body hit the ground?" (e.g., back, buttocks, head, hips, knees, hands, head, stomach)     Right lower back and hip 5. INJURY: "Did you hurt (injure) yourself when you fell?" If Yes, ask: "What did you injure? Tell me more about this?" (e.g., body area; type of injury; pain severity)"     Bruise broken skiing and lower back toward 6. PAIN: "Is there any pain?" If Yes, ask: "How bad is the pain?" (e.g., Scale 1-10; or mild,  moderate, severe)   - NONE (0): No pain   - MILD (1-3): Doesn't interfere with normal activities    - MODERATE (4-7): Interferes with normal activities or awakens from sleep    - SEVERE (8-10): Excruciating pain, unable to do any normal activities      *No Answer* 7. SIZE: For cuts, bruises, or swelling, ask: "How large is it?" (e.g., inches or centimeters)      Palm of hand - scrape palm of hand -  bleeding 8. PREGNANCY: "Is there any chance you are pregnant?" "When was your last menstrual period?"     N/a 9. OTHER SYMPTOMS: "Do you have any other symptoms?" (e.g., dizziness, fever, weakness; new onset or worsening).      Weakness  10. CAUSE: "What do you think caused the fall (or falling)?" (e.g., tripped, dizzy spell)       *No Answer*  Protocols used: Falls and [facility]

## 2022-08-29 ENCOUNTER — Other Ambulatory Visit: Payer: Self-pay

## 2022-08-29 ENCOUNTER — Emergency Department: Payer: Medicare HMO

## 2022-08-29 ENCOUNTER — Emergency Department
Admission: EM | Admit: 2022-08-29 | Discharge: 2022-08-29 | Disposition: A | Discharge status: Home / Self Care | Payer: Medicare HMO | Attending: Emergency Medicine | Admitting: Emergency Medicine

## 2022-08-29 DIAGNOSIS — E039 Hypothyroidism, unspecified: Secondary | ICD-10-CM | POA: Diagnosis not present

## 2022-08-29 DIAGNOSIS — I10 Essential (primary) hypertension: Secondary | ICD-10-CM | POA: Diagnosis not present

## 2022-08-29 DIAGNOSIS — E1140 Type 2 diabetes mellitus with diabetic neuropathy, unspecified: Secondary | ICD-10-CM | POA: Insufficient documentation

## 2022-08-29 DIAGNOSIS — M25551 Pain in right hip: Secondary | ICD-10-CM | POA: Diagnosis not present

## 2022-08-29 DIAGNOSIS — S70211A Abrasion, right hip, initial encounter: Secondary | ICD-10-CM | POA: Insufficient documentation

## 2022-08-29 DIAGNOSIS — Z043 Encounter for examination and observation following other accident: Secondary | ICD-10-CM | POA: Diagnosis not present

## 2022-08-29 DIAGNOSIS — Z23 Encounter for immunization: Secondary | ICD-10-CM | POA: Diagnosis not present

## 2022-08-29 DIAGNOSIS — M47812 Spondylosis without myelopathy or radiculopathy, cervical region: Secondary | ICD-10-CM | POA: Diagnosis not present

## 2022-08-29 DIAGNOSIS — R109 Unspecified abdominal pain: Secondary | ICD-10-CM | POA: Diagnosis not present

## 2022-08-29 DIAGNOSIS — W19XXXA Unspecified fall, initial encounter: Secondary | ICD-10-CM | POA: Diagnosis not present

## 2022-08-29 DIAGNOSIS — F0390 Unspecified dementia without behavioral disturbance: Secondary | ICD-10-CM | POA: Insufficient documentation

## 2022-08-29 DIAGNOSIS — K5730 Diverticulosis of large intestine without perforation or abscess without bleeding: Secondary | ICD-10-CM | POA: Insufficient documentation

## 2022-08-29 DIAGNOSIS — S0990XA Unspecified injury of head, initial encounter: Secondary | ICD-10-CM | POA: Diagnosis not present

## 2022-08-29 LAB — CBC WITH DIFFERENTIAL/PLATELET
Abs Immature Granulocytes: 0.02 10*3/uL (ref 0.00–0.07)
Basophils Absolute: 0.1 10*3/uL (ref 0.0–0.1)
Basophils Relative: 1 %
Eosinophils Absolute: 0.2 10*3/uL (ref 0.0–0.5)
Eosinophils Relative: 2 %
HCT: 37.7 % (ref 36.0–46.0)
Hemoglobin: 11.8 g/dL — ABNORMAL LOW (ref 12.0–15.0)
Immature Granulocytes: 0 %
Lymphocytes Relative: 44 %
Lymphs Abs: 3.1 10*3/uL (ref 0.7–4.0)
MCH: 27 pg (ref 26.0–34.0)
MCHC: 31.3 g/dL (ref 30.0–36.0)
MCV: 86.3 fL (ref 80.0–100.0)
Monocytes Absolute: 0.5 10*3/uL (ref 0.1–1.0)
Monocytes Relative: 7 %
Neutro Abs: 3.3 10*3/uL (ref 1.7–7.7)
Neutrophils Relative %: 46 %
Platelets: 297 10*3/uL (ref 150–400)
RBC: 4.37 MIL/uL (ref 3.87–5.11)
RDW: 14 % (ref 11.5–15.5)
WBC: 7.2 10*3/uL (ref 4.0–10.5)
nRBC: 0 % (ref 0.0–0.2)

## 2022-08-29 LAB — COMPREHENSIVE METABOLIC PANEL
ALT: 22 U/L (ref 0–44)
AST: 28 U/L (ref 15–41)
Albumin: 3.8 g/dL (ref 3.5–5.0)
Alkaline Phosphatase: 50 U/L (ref 38–126)
Anion gap: 10 (ref 5–15)
BUN: 12 mg/dL (ref 8–23)
CO2: 28 mmol/L (ref 22–32)
Calcium: 10 mg/dL (ref 8.9–10.3)
Chloride: 103 mmol/L (ref 98–111)
Creatinine, Ser: 0.83 mg/dL (ref 0.44–1.00)
GFR, Estimated: >60 mL/min (ref >60)
Glucose, Bld: 153 mg/dL — ABNORMAL HIGH (ref 70–99)
Potassium: 4 mmol/L (ref 3.5–5.1)
Sodium: 141 mmol/L (ref 135–145)
Total Bilirubin: 0.9 mg/dL (ref 0.3–1.2)
Total Protein: 7.8 g/dL (ref 6.5–8.1)

## 2022-08-29 LAB — TROPONIN I (HIGH SENSITIVITY): Troponin I (High Sensitivity): 5 ng/L (ref <18)

## 2022-08-29 MED ORDER — TETANUS-DIPHTH-ACELL PERTUSSIS 5-2.5-18.5 LF-MCG/0.5 IM SUSY
0.5000 mL | PREFILLED_SYRINGE | Freq: Once | INTRAMUSCULAR | Status: AC
  Administered 2022-08-29: 0.5 mL via INTRAMUSCULAR
  Filled 2022-08-29: qty 0.5

## 2022-08-29 MED ORDER — IOHEXOL 300 MG/ML  SOLN
100.0000 mL | Freq: Once | INTRAMUSCULAR | Status: AC | PRN
  Administered 2022-08-29: 100 mL via INTRAVENOUS

## 2022-08-29 MED ORDER — LIDOCAINE 5 % EX PTCH
1.0000 | MEDICATED_PATCH | Freq: Once | CUTANEOUS | Status: DC
  Administered 2022-08-29: 1 via TRANSDERMAL
  Filled 2022-08-29: qty 1

## 2022-08-29 NOTE — ED Notes (Signed)
Patient transported to CT 

## 2022-08-29 NOTE — Discharge Instructions (Signed)
Workup was reassuring without any evidence of fractures or injuries.  Her hemoglobin was slightly lower than her baseline at 11.8.  You can follow this up with your primary care doctor or return to the ER if she develops any black tarry stools weakness or any other concerns

## 2022-08-29 NOTE — ED Provider Notes (Signed)
[facility] Provider Note    Event Date/Time   First MD Initiated Contact with Patient 08/29/22 1527     (approximate)   History   Fall   HPI  [name] [name] is a 88 y.o. female with hypertension, hyperlipidemia, hypothyroidism, multiple falls who comes in with concern for a fall.  Patient has a history of dementia and diabetic neuropathy.  She had an unwitnessed fall 2 nights ago.  She is an abrasion on her right hip but still able to ambulate.  Patient presents with her daughter who lives with her.  She reports that patient typically ambulates with a walker or cane to pain from a distance.  Late  at nighttime on Friday morning/Thursday night patient got out of bed and slipped and fell.  Her husband was able to help get her up off the ground and they did not tell the daughter until the morning.  Since then she has been able to ambulate but has had some abrasion noted on her right flank.  She does not have any blood thinners.  Unclear if she hit her head.  She does have dementia.  Does not Solik she was on the ground for prolonged period of time.  She denies any chest pain, shortness of breath.  She is been taking Tylenol to help with the pain unclear last tetanus shot.  Physical Exam   Triage Vital Signs: ED Triage Vitals  Enc Vitals Group     BP 08/29/22 1400 136/68     Pulse Rate 08/29/22 1400 73     Resp 08/29/22 1400 18     Temp 08/29/22 1400 98.7 F (37.1 C)     Temp Source 08/29/22 1400 Oral     SpO2 08/29/22 1400 94 %     Weight --      Height --      Head Circumference --      Peak Flow --      Pain Score 08/29/22 1404 2     Pain Loc --      Pain Edu? --      Excl. in [place]? --     Most recent vital signs: Vitals:   08/29/22 1400  BP: 136/68  Pulse: 73  Resp: 18  Temp: 98.7 F (37.1 C)  SpO2: 94%     General: Awake, no distress.  CV:  Good peripheral perfusion.  Resp:  Normal effort.  Abd:  No distention.  Other:  Patient able  to lift both legs up off the bed.  No tenderness in the legs other than the right hip.  Skin abrasion that is more like a right flank area.  2+ distal pulse.  Abdomen soft and nontender.  No chest wall tenderness.   ED Results / Procedures / Treatments   Labs (all labs ordered are listed, but only abnormal results are displayed) Labs Reviewed  CBC WITH DIFFERENTIAL/PLATELET - Abnormal; Notable for the following components:      Result Value   Hemoglobin 11.8 (*)    All other components within normal limits  COMPREHENSIVE METABOLIC PANEL - Abnormal; Notable for the following components:   Glucose, Bld 153 (*)    All other components within normal limits     EKG  My interpretation of EKG:  Sinus rate of 62 without any ST elevation or T wave inversions except for aVL, type I AV block and left anterior fascicular block  RADIOLOGY I have reviewed the xray personally and  interpreted no evidence of any fracture    PROCEDURES:  Critical Care performed: No  .1-3 Lead EKG Interpretation  Performed by: Vanessa St. Donatus, MD Authorized by: Vanessa Coronita, MD     Interpretation: normal     ECG rate:  70   ECG rate assessment: normal     Rhythm: sinus rhythm     Ectopy: none     Conduction: normal      MEDICATIONS ORDERED IN ED: Medications  lidocaine (LIDODERM) 5 % 1 patch (1 patch Transdermal Patch Applied 08/29/22 1623)  Tdap (BOOSTRIX) injection 0.5 mL (0.5 mLs Intramuscular Given 08/29/22 1624)  iohexol (OMNIPAQUE) 300 MG/ML solution 100 mL (100 mLs Intravenous Contrast Given 08/29/22 1907)     IMPRESSION / MDM / ASSESSMENT AND PLAN / ED COURSE  I reviewed the triage vital signs and the nursing notes.   Patient's presentation is most consistent with acute presentation with potential threat to life or bodily function.   Patient comes in with a fall CT head ordered evaluate for intercranial hemorrhage, cervical fracture given the right flank pain slight drop in hemoglobin  I will get a CT scan to make sure no occult fractures of the hip as well as no retroperitoneal bleeds.  CMP reassuring.  CBC shows slightly low hemoglobin 11.8 down from 12.610 days ago Right x-ray negative for fracture Trope negative  No white count elevation or fevers or urinary symptoms suspect just UTI.  Your hemoglobin was slightly low.  Therefore to keep an eye out for any dark stools but denied knowing of any recently.  Offered to do rectal exam today but patient and daughter declined.  They will follow this up with her PCP.  CT cervical reassuring.  CT head reassuring.  CT abdomen reassuring  Patient has been up and ambulatory with cane.  I have updated them on the results and they feel comfortable with discharge home and they will follow-up with her PCP for the recheck of hemoglobin  The patient is on the cardiac monitor to evaluate for evidence of arrhythmia and/or significant heart rate changes.      FINAL CLINICAL IMPRESSION(S) / ED DIAGNOSES   Final diagnoses:  Fall, initial encounter  Abrasion of right hip, initial encounter     Rx / DC Orders   ED Discharge Orders     None        Note:  This document was prepared using Dragon voice recognition software and may include unintentional dictation errors.   Vanessa , MD 08/29/22 [phone]

## 2022-08-29 NOTE — ED Triage Notes (Addendum)
Pt had an unwitnessed fall in her bedroom 2 nights ago, pt has dementia and diabetic neuropathy. Pt has abrasion on right hip and has been limping. Pt is alert, oriented to self and situation only. Pt does not take blood thinners. No LOC or head injury. No obvious bleeding or deformity noted.

## 2022-09-01 ENCOUNTER — Telehealth: Payer: Self-pay | Admitting: Family Medicine

## 2022-09-01 NOTE — Telephone Encounter (Signed)
Home Health Verbal Orders - Caller/Agency: Gennaro Africa PT from [facility] Number: [phone] Requesting OT/PT/Skilled Nursing/Social Work/Speech Therapy: PT and Home Health Aid  Frequency:   1w9 for both PT and Home Health Aid

## 2022-09-02 ENCOUNTER — Telehealth: Payer: Self-pay

## 2022-09-02 ENCOUNTER — Ambulatory Visit (INDEPENDENT_AMBULATORY_CARE_PROVIDER_SITE_OTHER): Payer: Medicare HMO

## 2022-09-02 VITALS — Ht 65.0 in | Wt 183.0 lb

## 2022-09-02 DIAGNOSIS — Z0000 Encounter for general adult medical examination without abnormal findings: Secondary | ICD-10-CM

## 2022-09-02 NOTE — Telephone Encounter (Signed)
     Patient  visit on 12/16  at [place]    Have you been able to follow up with your primary care physician? Yes   The patient was or was not able to obtain any needed medicine or equipment. Yes   Are there diet recommendations that you are having difficulty following? NA  Patient expresses understanding of discharge instructions and education provided has no other needs at this time. Yes     [place], [facility], Care Management  [phone] [address], [place], [place] [address] Phone: [phone] Email: [email or web address]

## 2022-09-02 NOTE — Progress Notes (Signed)
Virtual Visit via Telephone Note  I connected with  [name] [name] on 09/02/22 at  9:00 AM EST by telephone and verified that I am speaking with the correct person using two identifiers. Spoke w/ daughter [name] [name]  Location: Patient: home Provider: [facility] Persons participating in the virtual visit: patient/Nurse Health Advisor   I discussed the limitations, risks, security and privacy concerns of performing an evaluation and management service by telephone and the availability of in person appointments. The patient expressed understanding and agreed to proceed.  Interactive audio and video telecommunications were attempted between this nurse and patient, however failed, due to patient having technical difficulties OR patient did not have access to video capability.  We continued and completed visit with audio only.  Some vital signs may be absent or patient reported.   [name] David, LPN  Subjective:   [name] [name] is a 88 y.o. female who presents for Medicare Annual (Subsequent) preventive examination.  Review of Systems     Cardiac Risk Factors include: advanced age (>38mn, >>42women);diabetes mellitus;sedentary lifestyle;hypertension     Objective:    There were no vitals filed for this visit. There is no height or weight on file to calculate BMI.     09/02/2022    9:07 AM 08/29/2022    2:05 PM 09/01/2021    8:36 AM 08/26/2020    8:38 AM 11/08/2017   10:42 AM 07/06/2017    9:58 AM 05/27/2015   11:15 AM  Advanced Directives  Does Patient Have a Medical Advance Directive? No No No Yes No No No  Type of AScientist, research (medical)Living will     Copy of [place] Chart?    No - copy requested     Would patient like information on creating a medical advance directive? No - Patient declined No - Patient declined No - Patient declined  Yes (MAU/Ambulatory/Procedural Areas - Information given)      Current Medications  (verified) Outpatient Encounter Medications as of 09/02/2022  Medication Sig   Accu-Chek Softclix Lancets lancets USE TO CHECK BLOOD SUGAR ONCE A DAY   Alcohol Swabs (DROPSAFE ALCOHOL PREP) 70 % PADS USE TO TEST BLOOD SUGAR EVERY DAY   ALPRAZolam (XANAX) 0.25 MG tablet Take 1 tablet (0.25 mg total) by mouth every 6 (six) hours as needed for anxiety.   amLODipine (NORVASC) 5 MG tablet TAKE 1 TABLET EVERY DAY   Blood Glucose Monitoring Suppl (ACCU-CHEK AVIVA PLUS) w/Device KIT USE TO CHECK BLOOD SUGAR ONCE A DAY   cyclobenzaprine (FLEXERIL) 5 MG tablet Take 1 tablet (5 mg total) by mouth 3 (three) times daily as needed for muscle spasms. Take in place of tizanidine   DULoxetine (CYMBALTA) 30 MG capsule TAKE 1 CAPSULE EVERY DAY   fluticasone (FLONASE) 50 MCG/ACT nasal spray PLACE 2 SPRAYS INTO BOTH NOSTRILS DAILY.   furosemide (LASIX) 20 MG tablet TAKE 1 TABLET EVERY DAY   gabapentin (NEURONTIN) 100 MG capsule TAKE 1 CAPSULE EVERY MORNING   gabapentin (NEURONTIN) 300 MG capsule TAKE 1 CAPSULE (300 MG TOTAL) BY MOUTH AT BEDTIME. IN ADDITION TO THE 100MG CAPSULE IN THE MORNING   glipiZIDE (GLUCOTROL XL) 10 MG 24 hr tablet TAKE 1 TABLET EVERY DAY   glucose blood (ACCU-CHEK AVIVA PLUS) test strip TEST BLOOD SUGAR ONE TIME DAILY   levothyroxine (SYNTHROID) 75 MCG tablet TAKE 1 TABLET EVERY DAY   metFORMIN (GLUCOPHAGE-XR) 750 MG 24 hr tablet TAKE 1 TABLET  TWICE DAILY   Multiple Vitamins-Minerals (DAILY MULTIVITAMIN PO) Take 1 tablet by mouth.   oxybutynin (DITROPAN) 5 MG tablet TAKE 1 TABLET 2 TO 3 TIMES DAILY AS NEEDED FOR  URINARY FREQUENCY   rivastigmine (EXELON) 1.5 MG capsule Take one tablet twice daily for two weeks, then increase to two tablets twice daily   tiZANidine (ZANAFLEX) 2 MG tablet Take 2 tablets (4 mg total) by mouth every 8 (eight) hours as needed for muscle spasms.   valsartan (DIOVAN) 160 MG tablet Take 1 tablet (160 mg total) by mouth daily.   ezetimibe (ZETIA) 10 MG tablet TAKE 1  TABLET EVERY DAY (Patient not taking: Reported on 09/02/2022)   No facility-administered encounter medications on file as of 09/02/2022.    Allergies (verified) Aricept [donepezil], Lovastatin, Pravastatin sodium, and Simvastatin   History: Past Medical History:  Diagnosis Date   Allergy    Arthritis    Cancer ([place])    thryoid   Cataract    Diabetes mellitus without complication ([place])    Hyperlipidemia    Hypertension    Thyroid disease    Past Surgical History:  Procedure Laterality Date   ABDOMINAL HYSTERECTOMY     BREAST BIOPSY Right ?   benign   CATARACT EXTRACTION     THYROIDECTOMY, PARTIAL  1994   TONSILLECTOMY     Family History  Problem Relation Age of Onset   Cancer Daughter        breast   Diabetes Son    Social History   Socioeconomic History   Marital status: Married    Spouse name: Not on file   Number of children: 3   Years of education: Not on file   Highest education level: 10th grade  Occupational History   Occupation: Retired   Tobacco Use   Smoking status: Never   Smokeless tobacco: Never  Vaping Use   Vaping Use: Never used  Substance and Sexual Activity   Alcohol use: No    Alcohol/week: 0.0 standard drinks of alcohol   Drug use: No   Sexual activity: Not on file  Other Topics Concern   Not on file  Social History Narrative   Not on file   Social Determinants of Health   Financial Resource Strain: Low Risk  (09/02/2022)   Overall Financial Resource Strain (CARDIA)    Difficulty of Paying Living Expenses: Not hard at all  Food Insecurity: No Food Insecurity (09/02/2022)   Hunger Vital Sign    Worried About Running Out of Food in the Last Year: Never true    Ran Out of Food in the Last Year: Never true  Transportation Needs: No Transportation Needs (09/02/2022)   PRAPARE - Hydrologist (Medical): No    Lack of Transportation (Non-Medical): No  Physical Activity: Inactive (09/02/2022)   Exercise  Vital Sign    Days of Exercise per Week: 0 days    Minutes of Exercise per Session: 0 min  Stress: No Stress Concern Present (09/02/2022)   [place]    Feeling of Stress : Only a little  Social Connections: Socially Isolated (09/02/2022)   Social Connection and Isolation Panel [NHANES]    Frequency of Communication with Friends and Family: Once a week    Frequency of Social Gatherings with Friends and Family: Once a week    Attends Religious Services: Never    Marine scientist or Organizations: No  Attends Archivist Meetings: Never    Marital Status: Married    Tobacco Counseling Counseling given: Not Answered   Clinical Intake:  Pre-visit preparation completed: Yes        Nutritional Risks: None Diabetes: Yes CBG done?: No Did pt. bring in CBG monitor from home?: No  How often do you need to have someone help you when you read instructions, pamphlets, or other written materials from your doctor or pharmacy?: 1 - Never  Diabetic?yes Nutrition Risk Assessment:  Has the patient had any N/V/D within the last 2 months?  Yes  Does the patient have any non-healing wounds?  Yes  Has the patient had any unintentional weight loss or weight gain?  No   Diabetes:  Is the patient diabetic?  Yes  If diabetic, was a CBG obtained today?  No  Did the patient bring in their glucometer from home?  No  How often do you monitor your CBG's? occasionally.   Financial Strains and Diabetes Management:  Are you having any financial strains with the device, your supplies or your medication? No .  Does the patient want to be seen by Chronic Care Management for management of their diabetes?  No  Would the patient like to be referred to a Nutritionist or for Diabetic Management?  No   Diabetic Exams:  Diabetic Eye Exam: Completed 04/01/20. Overdue for diabetic eye exam. Pt has been advised about the  importance in completing this exam.  Diabetic Foot Exam: Completed no. Pt has been advised about the importance in completing this exam.    Interpreter Needed?: No  Information entered by :: Kirke Shaggy, LPN   Activities of Daily Living    09/02/2022    9:08 AM  In your present state of health, do you have any difficulty performing the following activities:  Hearing? 0  Vision? 0  Difficulty concentrating or making decisions? 1  Walking or climbing stairs? 1  Dressing or bathing? 0  Doing errands, shopping? 1  Preparing Food and eating ? N  Using the Toilet? N  In the past six months, have you accidently leaked urine? N  Do you have problems with loss of bowel control? N  Managing your Medications? Y  Managing your Finances? Y  Housekeeping or managing your Housekeeping? Y    Patient Care Team: Birdie Sons, MD as PCP - General (Family Medicine) Sharlet Salina, MD as Referring Physician (Physical Medicine and Rehabilitation) Marshall Cork, MD as Consulting Physician (Ophthalmology)  Indicate any recent Medical Services you may have received from other than [facility] providers in the past year (date may be approximate).     Assessment:   This is a routine wellness examination for [name].  Hearing/Vision screen Hearing Screening - Comments:: No aids Vision Screening - Comments:: Wears glasses- [facility].  Dietary issues and exercise activities discussed: Current Exercise Habits: The patient does not participate in regular exercise at present   Goals Addressed             This Visit's Progress    DIET - EAT MORE FRUITS AND VEGETABLES         Depression Screen    09/02/2022    9:05 AM 09/01/2021    8:32 AM 05/05/2021   11:35 AM 12/03/2020   10:04 AM 11/05/2020   11:04 AM 08/26/2020    8:32 AM 08/06/2020   10:27 AM  PHQ 2/9 Scores  PHQ - 2 Score _0 0 1 0  PHQ- 9 Score _0 Fall Risk    09/02/2022    9:08 AM 09/01/2021     8:38 AM 05/23/2021    2:02 PM 05/05/2021   11:35 AM 11/25/2020    3:46 PM  Fall Risk   Falls in the past year? _1 Number falls in past yr: _2 Injury with Fall? 1 0 0 0 0  Risk for fall due to : Impaired balance/gait History of fall(s);Mental status change History of fall(s) History of fall(s) History of fall(s);Medication side effect  Follow up Falls evaluation completed;Falls prevention discussed Falls prevention discussed Falls prevention discussed Falls evaluation completed Falls prevention discussed    FALL RISK PREVENTION PERTAINING TO THE HOME:  Any stairs in or around the home? Yes  If so, are there any without handrails? No  Home free of loose throw rugs in walkways, pet beds, electrical cords, etc? Yes  Adequate lighting in your home to reduce risk of falls? Yes   ASSISTIVE DEVICES UTILIZED TO PREVENT FALLS:  Life alert? No  Use of a cane, walker or w/c? Yes  Grab bars in the bathroom? Yes  Shower chair or bench in shower? Yes  Elevated toilet seat or a handicapped toilet? Yes    Cognitive Function:pt unavailable 2023        08/25/2016   10:16 AM  6CIT Screen  What Year? 0 points  What month? 0 points  What time? 0 points  Count back from 20 0 points  Months in reverse 0 points  Repeat phrase 4 points  Total Score 4 points    Immunizations Immunization History  Administered Date(s) Administered   Fluad Quad(high Dose 65+) 06/14/2019, 06/21/2020, 08/19/2022   Influenza, High Dose Seasonal PF 05/27/2015, 06/24/2016, 07/15/2017, 07/30/2018   PFIZER Comirnaty(Gray Top)Covid-19 Tri-Sucrose Vaccine 08/19/2022   PFIZER(Purple Top)SARS-COV-2 Vaccination 10/28/2019, 11/21/2019, 07/11/2020   Pneumococcal Conjugate-13 08/07/2014   Pneumococcal Polysaccharide-23 12/31/2000   Td 02/19/1998   Tdap 06/20/2011, 08/29/2022   Zoster, Live 12/13/2007    TDAP status: Up to date  Flu Vaccine status: Up to date  Pneumococcal vaccine status: Up to  date  Covid-19 vaccine status: Completed vaccines  Qualifies for Shingles Vaccine? Yes   Zostavax completed Yes   Shingrix Completed?: No.    Education has been provided regarding the importance of this vaccine. Patient has been advised to call insurance company to determine out of pocket expense if they have not yet received this vaccine. Advised may also receive vaccine at local pharmacy or Health Dept. Verbalized acceptance and understanding.  Screening Tests Health Maintenance  Topic Date Due   Zoster Vaccines- Shingrix (1 of 2) Never done   MAMMOGRAM  08/30/2019   DEXA SCAN  04/21/2020   OPHTHALMOLOGY EXAM  04/01/2021   COVID-19 Vaccine (5 - 2023-24 season) 10/14/2022   HEMOGLOBIN A1C  02/18/2023   DTaP/Tdap/Td (4 - Td or Tdap) 08/29/2032   Pneumonia Vaccine 81+ Years old  Completed   INFLUENZA VACCINE  Completed   HPV VACCINES  Aged Out    Health Maintenance  Health Maintenance Due  Topic Date Due   Zoster Vaccines- Shingrix (1 of 2) Never done   MAMMOGRAM  08/30/2019   DEXA SCAN  04/21/2020   OPHTHALMOLOGY EXAM  04/01/2021    Colorectal cancer screening: No longer required.   Mammogram status: No longer required due to age.  Lung Cancer Screening: (Low Dose  CT Chest recommended if Age 86-80 years, 30 pack-year currently smoking OR have quit w/in 15years.) does not qualify.   Additional Screening:  Hepatitis C Screening: does not qualify; Completed no  Vision Screening: Recommended annual ophthalmology exams for early detection of glaucoma and other disorders of the eye. Is the patient up to date with their annual eye exam?  Yes  Who is the provider or what is the name of the office in which the patient attends annual eye exams? [facility]. If pt is not established with a provider, would they like to be referred to a provider to establish care? No .   Dental Screening: Recommended annual dental exams for proper oral hygiene  Community Resource Referral /  Chronic Care Management: CRR required this visit?  No   CCM required this visit?  No      Plan:     I have personally reviewed and noted the following in the patient's chart:   Medical and social history Use of alcohol, tobacco or illicit drugs  Current medications and supplements including opioid prescriptions. Patient is not currently taking opioid prescriptions. Functional ability and status Nutritional status Physical activity Advanced directives List of other physicians Hospitalizations, surgeries, and ER visits in previous 12 months Vitals Screenings to include cognitive, depression, and falls Referrals and appointments  In addition, I have reviewed and discussed with patient certain preventive protocols, quality metrics, and best practice recommendations. A written personalized care plan for preventive services as well as general preventive health recommendations were provided to patient.     [name] David, LPN   24/93/2419   Nurse Notes: none

## 2022-09-02 NOTE — Telephone Encounter (Signed)
That's fine

## 2022-09-02 NOTE — Patient Instructions (Signed)
[name] [name] , Thank you for taking time to come for your Medicare Wellness Visit. I appreciate your ongoing commitment to your health goals. Please review the following plan we discussed and let me know if I can assist you in the future.   Screening recommendations/referrals: Colonoscopy: aged out Mammogram: aged out Bone Density: aged out Recommended yearly ophthalmology/optometry visit for glaucoma screening and checkup Recommended yearly dental visit for hygiene and checkup  Vaccinations: Influenza vaccine: 08/19/22 Pneumococcal vaccine: 08/07/14 Tdap vaccine: 08/29/22 Shingles vaccine: Zostavax 12/13/07   Covid-19:10/28/19, 11/21/19, 07/11/20, 08/19/22  Advanced directives: no  Conditions/risks identified: none  Next appointment: Follow up in one year for your annual wellness visit    Preventive Care 86 Years and Older, Female Preventive care refers to lifestyle choices and visits with your health care provider that can promote health and wellness. What does preventive care include? A yearly physical exam. This is also called an annual well check. Dental exams once or twice a year. Routine eye exams. Ask your health care provider how often you should have your eyes checked. Personal lifestyle choices, including: Daily care of your teeth and gums. Regular physical activity. Eating a healthy diet. Avoiding tobacco and drug use. Limiting alcohol use. Practicing safe sex. Taking low-dose aspirin every day. Taking vitamin and mineral supplements as recommended by your health care provider. What happens during an annual well check? The services and screenings done by your health care provider during your annual well check will depend on your age, overall health, lifestyle risk factors, and family history of disease. Counseling  Your health care provider may ask you questions about your: Alcohol use. Tobacco use. Drug use. Emotional well-being. Home and relationship  well-being. Sexual activity. Eating habits. History of falls. Memory and ability to understand (cognition). Work and work Statistician. Reproductive health. Screening  You may have the following tests or measurements: Height, weight, and BMI. Blood pressure. Lipid and cholesterol levels. These may be checked every 5 years, or more frequently if you are over 51 years old. Skin check. Lung cancer screening. You may have this screening every year starting at age 86 if you have a 30-pack-year history of smoking and currently smoke or have quit within the past 15 years. Fecal occult blood test (FOBT) of the stool. You may have this test every year starting at age 61. Flexible sigmoidoscopy or colonoscopy. You may have a sigmoidoscopy every 5 years or a colonoscopy every 10 years starting at age 95. Hepatitis C blood test. Hepatitis B blood test. Sexually transmitted disease (STD) testing. Diabetes screening. This is done by checking your blood sugar (glucose) after you have not eaten for a while (fasting). You may have this done every 1-3 years. Bone density scan. This is done to screen for osteoporosis. You may have this done starting at age 86. Mammogram. This may be done every 1-2 years. Talk to your health care provider about how often you should have regular mammograms. Talk with your health care provider about your test results, treatment options, and if necessary, the need for more tests. Vaccines  Your health care provider may recommend certain vaccines, such as: Influenza vaccine. This is recommended every year. Tetanus, diphtheria, and acellular pertussis (Tdap, Td) vaccine. You may need a Td booster every 10 years. Zoster vaccine. You may need this after age 45. Pneumococcal 13-valent conjugate (PCV13) vaccine. One dose is recommended after age 18. Pneumococcal polysaccharide (PPSV23) vaccine. One dose is recommended after age 71. Talk to your health care  provider about which  screenings and vaccines you need and how often you need them. This information is not intended to replace advice given to you by your health care provider. Make sure you discuss any questions you have with your health care provider. Document Released: 09/27/2015 Document Revised: 05/20/2016 Document Reviewed: 07/02/2015 Elsevier Interactive Patient Education  2017 [place] Prevention in the Home Falls can cause injuries. They can happen to people of all ages. There are many things you can do to make your home safe and to help prevent falls. What can I do on the outside of my home? Regularly fix the edges of walkways and driveways and fix any cracks. Remove anything that might make you trip as you walk through a door, such as a raised step or threshold. Trim any bushes or trees on the path to your home. Use bright outdoor lighting. Clear any walking paths of anything that might make someone trip, such as rocks or tools. Regularly check to see if handrails are loose or broken. Make sure that both sides of any steps have handrails. Any raised decks and porches should have guardrails on the edges. Have any leaves, snow, or ice cleared regularly. Use sand or salt on walking paths during winter. Clean up any spills in your garage right away. This includes oil or grease spills. What can I do in the bathroom? Use night lights. Install grab bars by the toilet and in the tub and shower. Do not use towel bars as grab bars. Use non-skid mats or decals in the tub or shower. If you need to sit down in the shower, use a plastic, non-slip stool. Keep the floor dry. Clean up any water that spills on the floor as soon as it happens. Remove soap buildup in the tub or shower regularly. Attach bath mats securely with double-sided non-slip rug tape. Do not have throw rugs and other things on the floor that can make you trip. What can I do in the bedroom? Use night lights. Make sure that you have a  light by your bed that is easy to reach. Do not use any sheets or blankets that are too big for your bed. They should not hang down onto the floor. Have a firm chair that has side arms. You can use this for support while you get dressed. Do not have throw rugs and other things on the floor that can make you trip. What can I do in the kitchen? Clean up any spills right away. Avoid walking on wet floors. Keep items that you use a lot in easy-to-reach places. If you need to reach something above you, use a strong step stool that has a grab bar. Keep electrical cords out of the way. Do not use floor polish or wax that makes floors slippery. If you must use wax, use non-skid floor wax. Do not have throw rugs and other things on the floor that can make you trip. What can I do with my stairs? Do not leave any items on the stairs. Make sure that there are handrails on both sides of the stairs and use them. Fix handrails that are broken or loose. Make sure that handrails are as long as the stairways. Check any carpeting to make sure that it is firmly attached to the stairs. Fix any carpet that is loose or worn. Avoid having throw rugs at the top or bottom of the stairs. If you do have throw rugs, attach them to the  floor with carpet tape. Make sure that you have a light switch at the top of the stairs and the bottom of the stairs. If you do not have them, ask someone to add them for you. What else can I do to help prevent falls? Wear shoes that: Do not have high heels. Have rubber bottoms. Are comfortable and fit you well. Are closed at the toe. Do not wear sandals. If you use a stepladder: Make sure that it is fully opened. Do not climb a closed stepladder. Make sure that both sides of the stepladder are locked into place. Ask someone to hold it for you, if possible. Clearly mark and make sure that you can see: Any grab bars or handrails. First and last steps. Where the edge of each step  is. Use tools that help you move around (mobility aids) if they are needed. These include: Canes. Walkers. Scooters. Crutches. Turn on the lights when you go into a dark area. Replace any light bulbs as soon as they burn out. Set up your furniture so you have a clear path. Avoid moving your furniture around. If any of your floors are uneven, fix them. If there are any pets around you, be aware of where they are. Review your medicines with your doctor. Some medicines can make you feel dizzy. This can increase your chance of falling. Ask your doctor what other things that you can do to help prevent falls. This information is not intended to replace advice given to you by your health care provider. Make sure you discuss any questions you have with your health care provider. Document Released: 06/27/2009 Document Revised: 02/06/2016 Document Reviewed: 10/05/2014 Elsevier Interactive Patient Education  2017 [employer].

## 2022-09-03 NOTE — Telephone Encounter (Signed)
[name] [name] called for follow up on request- advise per provider note- request is granted per provider.

## 2022-09-08 DIAGNOSIS — I10 Essential (primary) hypertension: Secondary | ICD-10-CM | POA: Diagnosis not present

## 2022-09-08 DIAGNOSIS — E1149 Type 2 diabetes mellitus with other diabetic neurological complication: Secondary | ICD-10-CM | POA: Diagnosis not present

## 2022-09-08 DIAGNOSIS — F0280 Dementia in other diseases classified elsewhere without behavioral disturbance: Secondary | ICD-10-CM | POA: Diagnosis not present

## 2022-09-08 DIAGNOSIS — E559 Vitamin D deficiency, unspecified: Secondary | ICD-10-CM | POA: Diagnosis not present

## 2022-09-08 DIAGNOSIS — E039 Hypothyroidism, unspecified: Secondary | ICD-10-CM | POA: Diagnosis not present

## 2022-09-08 DIAGNOSIS — M519 Unspecified thoracic, thoracolumbar and lumbosacral intervertebral disc disorder: Secondary | ICD-10-CM | POA: Diagnosis not present

## 2022-09-08 DIAGNOSIS — M1990 Unspecified osteoarthritis, unspecified site: Secondary | ICD-10-CM | POA: Diagnosis not present

## 2022-09-08 DIAGNOSIS — S21221D Laceration with foreign body of right back wall of thorax without penetration into thoracic cavity, subsequent encounter: Secondary | ICD-10-CM | POA: Diagnosis not present

## 2022-09-08 DIAGNOSIS — H35033 Hypertensive retinopathy, bilateral: Secondary | ICD-10-CM | POA: Diagnosis not present

## 2022-09-08 DIAGNOSIS — E539 Vitamin B deficiency, unspecified: Secondary | ICD-10-CM

## 2022-09-21 ENCOUNTER — Ambulatory Visit (INDEPENDENT_AMBULATORY_CARE_PROVIDER_SITE_OTHER): Payer: Medicare HMO | Admitting: Family Medicine

## 2022-09-21 ENCOUNTER — Encounter: Payer: Self-pay | Admitting: Family Medicine

## 2022-09-21 VITALS — BP 147/68 | HR 84 | Wt 182.8 lb

## 2022-09-21 DIAGNOSIS — F0390 Unspecified dementia without behavioral disturbance: Secondary | ICD-10-CM | POA: Insufficient documentation

## 2022-09-21 DIAGNOSIS — R413 Other amnesia: Secondary | ICD-10-CM | POA: Insufficient documentation

## 2022-09-21 DIAGNOSIS — E538 Deficiency of other specified B group vitamins: Secondary | ICD-10-CM

## 2022-09-21 DIAGNOSIS — F03B18 Unspecified dementia, moderate, with other behavioral disturbance: Secondary | ICD-10-CM | POA: Diagnosis not present

## 2022-09-21 MED ORDER — RIVASTIGMINE TARTRATE 3 MG PO CAPS: 3.0000 mg | ORAL_CAPSULE | Freq: Two times a day (BID) | ORAL | 0 refills | Status: DC

## 2022-09-21 MED ORDER — RIVASTIGMINE TARTRATE 3 MG PO CAPS: ORAL_CAPSULE | ORAL | 0 refills | Status: DC

## 2022-09-21 NOTE — Progress Notes (Signed)
I,Sha'taria Tyson,acting as a Education administrator for Lelon Huh, MD.,have documented all relevant documentation on the behalf of Lelon Huh, MD,as directed by  Lelon Huh, MD while in the presence of Lelon Huh, MD.   Established patient visit   Patient: [name] [name]   DOB: September 23, 1932   89 y.o. Female  MRN: [id number] Visit Date: 09/21/2022  Today's healthcare provider: Lelon Huh, MD   No chief complaint on file.  Subjective    HPI  Dementia She is here for evaluation and treatment of cognitive problems. She is accompanied by patient and daughter. Primary caregiver is patient and daughter. The family and the patient identify problems with changes in short and long term memory, day/night changes, getting disoriented outside of familiar environment, and repetition of questions.     She had labs last month notable only for slightly low b12 Lab Results  Component Value Date   VITAMINB12 344 08/19/2022   She was advised to increase supplemental b12 dose. Her daughter requested medication for dementia and was prescribed rivastigmine which she has titrated up to 2 x 1.'5mg'$  twice a day as of last week. Her memory has not changed, but her daughter reports that patient's paranoid thinking has gotten much better. Is not having any nausea, upset stomach or any other known adverse effects since starting medications.     -----------------------------------------------------------------------------------------   Medications: Outpatient Medications Prior to Visit  Medication Sig   Accu-Chek Softclix Lancets lancets USE TO CHECK BLOOD SUGAR ONCE A DAY   Alcohol Swabs (DROPSAFE ALCOHOL PREP) 70 % PADS USE TO TEST BLOOD SUGAR EVERY DAY   ALPRAZolam (XANAX) 0.25 MG tablet Take 1 tablet (0.25 mg total) by mouth every 6 (six) hours as needed for anxiety.   amLODipine (NORVASC) 5 MG tablet TAKE 1 TABLET EVERY DAY   Blood Glucose Monitoring Suppl (ACCU-CHEK AVIVA PLUS) w/Device KIT USE TO  CHECK BLOOD SUGAR ONCE A DAY   cyclobenzaprine (FLEXERIL) 5 MG tablet Take 1 tablet (5 mg total) by mouth 3 (three) times daily as needed for muscle spasms. Take in place of tizanidine   DULoxetine (CYMBALTA) 30 MG capsule TAKE 1 CAPSULE EVERY DAY   ezetimibe (ZETIA) 10 MG tablet TAKE 1 TABLET EVERY DAY (Patient not taking: Reported on 09/02/2022)   fluticasone (FLONASE) 50 MCG/ACT nasal spray PLACE 2 SPRAYS INTO BOTH NOSTRILS DAILY.   furosemide (LASIX) 20 MG tablet TAKE 1 TABLET EVERY DAY   gabapentin (NEURONTIN) 100 MG capsule TAKE 1 CAPSULE EVERY MORNING   gabapentin (NEURONTIN) 300 MG capsule TAKE 1 CAPSULE (300 MG TOTAL) BY MOUTH AT BEDTIME. IN ADDITION TO THE '100MG'$  CAPSULE IN THE MORNING   glipiZIDE (GLUCOTROL XL) 10 MG 24 hr tablet TAKE 1 TABLET EVERY DAY   glucose blood (ACCU-CHEK AVIVA PLUS) test strip TEST BLOOD SUGAR ONE TIME DAILY   levothyroxine (SYNTHROID) 75 MCG tablet TAKE 1 TABLET EVERY DAY   metFORMIN (GLUCOPHAGE-XR) 750 MG 24 hr tablet TAKE 1 TABLET TWICE DAILY   Multiple Vitamins-Minerals (DAILY MULTIVITAMIN PO) Take 1 tablet by mouth.   oxybutynin (DITROPAN) 5 MG tablet TAKE 1 TABLET 2 TO 3 TIMES DAILY AS NEEDED FOR  URINARY FREQUENCY   rivastigmine (EXELON) 1.5 MG capsule Take one tablet twice daily for two weeks, then increase to two tablets twice daily   tiZANidine (ZANAFLEX) 2 MG tablet Take 2 tablets (4 mg total) by mouth every 8 (eight) hours as needed for muscle spasms.   valsartan (DIOVAN) 160 MG tablet Take 1  tablet (160 mg total) by mouth daily.   No facility-administered medications prior to visit.    Review of Systems  Constitutional:  Negative for appetite change, chills, fatigue and fever.  Respiratory:  Negative for chest tightness and shortness of breath.   Cardiovascular:  Negative for chest pain and palpitations.  Gastrointestinal:  Negative for abdominal pain, nausea and vomiting.  Neurological:  Negative for dizziness and weakness.        Objective    BP (!) 147/68 (BP Location: Right Arm, Patient Position: Sitting, Cuff Size: Large)   Pulse 84   Wt 182 lb 12.8 oz (82.9 kg)   SpO2 98%   BMI 30.42 kg/m  . BP Readings from Last 3 Encounters:  08/29/22 (!) 160/72  08/19/22 (!) 160/64  05/05/21 (!) 134/57    Physical Exam   General: Appearance:    Obese female in no acute distress  Eyes:    PERRL, conjunctiva/corneas clear, EOM's intact       Lungs:     Clear to auscultation bilaterally, respirations unlabored  Heart:    Normal heart rate. Normal rhythm. No murmurs, rubs, or gallops.    MS:   All extremities are intact.    Neurologic:   Awake, alert, oriented x 2. No apparent focal neurological defect.         Assessment & Plan     1. Moderate dementia with other behavioral disturbance, unspecified dementia type ([place]) Is tolerating titration of rivastigmine to 2 x 1.'5mg'$  twice a day with no adverse effects and significant improvement of paranoia, although memory is stable poor - rivastigmine (EXELON) 3 MG capsule; Take one tablet twice daily  Dispense: 60 capsule; Refill: 0   Anticipate increase to 4.'5mg'$  in one month and follow up office visit in 6-8 weeks.    2. B12 deficiency Doing well with increase dose of B12. Will recheck at follow up visit.       The entirety of the information documented in the History of Present Illness, Review of Systems and Physical Exam were personally obtained by me. Portions of this information were initially documented by the CMA and reviewed by me for thoroughness and accuracy.     Lelon Huh, MD  [facility] [phone] (phone) [phone] (fax)  [place]

## 2022-09-21 NOTE — Patient Instructions (Signed)
.   Please review the attached list of medications and notify my office if there are any errors.   . Please bring all of your medications to every appointment so we can make sure that our medication list is the same as yours.   

## 2022-09-24 ENCOUNTER — Other Ambulatory Visit: Payer: Self-pay | Admitting: Family Medicine

## 2022-09-24 NOTE — Telephone Encounter (Signed)
Requested Prescriptions  Pending Prescriptions Disp Refills   ezetimibe (ZETIA) 10 MG tablet [Pharmacy Med Name: EZETIMIBE 10 MG Tablet] 90 tablet 3    Sig: TAKE 1 TABLET EVERY DAY     Cardiovascular:  Antilipid - Sterol Transport Inhibitors Failed - 09/24/2022  3:05 AM      Failed - Lipid Panel in normal range within the last 12 months    Cholesterol, Total  Date Value Ref Range Status  08/19/2022 195 100 - 199 mg/dL Final   LDL Chol Calc (NIH)  Date Value Ref Range Status  08/19/2022 133 (H) 0 - 99 mg/dL Final   HDL  Date Value Ref Range Status  08/19/2022 39 (L) >39 mg/dL Final   Triglycerides  Date Value Ref Range Status  08/19/2022 126 0 - 149 mg/dL Final         Passed - AST in normal range and within 360 days    AST  Date Value Ref Range Status  08/29/2022 28 15 - 41 U/L Final         Passed - ALT in normal range and within 360 days    ALT  Date Value Ref Range Status  08/29/2022 22 0 - 44 U/L Final         Passed - Patient is not pregnant      Passed - Valid encounter within last 12 months    Recent Outpatient Visits           3 days ago B12 deficiency   [place], Kirstie Peri, MD   1 month ago Primary hypertension   [facility] Birdie Sons, MD   1 month ago Non-seasonal allergic rhinitis due to pollen   [place], Kirstie Peri, MD   1 year ago Memory change   [facility] Birdie Sons, MD   1 year ago No-show for appointment   [facility] Birdie Sons, MD       Future Appointments             In 3 months [place], Kirstie Peri, MD [facility]             amLODipine (NORVASC) 5 MG tablet [Pharmacy Med Name: AMLODIPINE BESYLATE 5 MG Tablet] 90 tablet 3    Sig: TAKE 1 TABLET EVERY DAY     Cardiovascular: Calcium Channel Blockers 2 Failed - 09/24/2022  3:05 AM      Failed - Last BP in normal range    BP Readings from Last 1  Encounters:  09/21/22 (!) 147/68         Passed - Last Heart Rate in normal range    Pulse Readings from Last 1 Encounters:  09/21/22 84         Passed - Valid encounter within last 6 months    Recent Outpatient Visits           3 days ago B12 deficiency   [facility] Birdie Sons, MD   1 month ago Primary hypertension   [facility] Birdie Sons, MD   1 month ago Non-seasonal allergic rhinitis due to pollen   [place], Kirstie Peri, MD   1 year ago Memory change   [facility] Birdie Sons, MD   1 year ago No-show for appointment   [facility] Birdie Sons, MD       Future Appointments  In 3 months Fisher, Kirstie Peri, MD [facility] [place]

## 2022-09-24 NOTE — Telephone Encounter (Signed)
Requested medication (s) are due for refill today - expired Rx  Requested medication (s) are on the active medication list -yes  Future visit scheduled -yes  Last refill: 08/22/21 #90 3RF  Notes to clinic: Expired Rx  Requested Prescriptions  Pending Prescriptions Disp Refills   amLODipine (NORVASC) 5 MG tablet [Pharmacy Med Name: AMLODIPINE BESYLATE 5 MG Tablet] 90 tablet 3    Sig: TAKE 1 TABLET EVERY DAY     Cardiovascular: Calcium Channel Blockers 2 Failed - 09/24/2022  3:05 AM      Failed - Last BP in normal range    BP Readings from Last 1 Encounters:  09/21/22 (!) 147/68         Passed - Last Heart Rate in normal range    Pulse Readings from Last 1 Encounters:  09/21/22 84         Passed - Valid encounter within last 6 months    Recent Outpatient Visits           3 days ago B12 deficiency   [facility] Birdie Sons, MD   1 month ago Primary hypertension   [facility] Birdie Sons, MD   1 month ago Non-seasonal allergic rhinitis due to pollen   [facility] Birdie Sons, MD   1 year ago Memory change   [facility] Birdie Sons, MD   1 year ago No-show for appointment   [facility] Birdie Sons, MD       Future Appointments             In 3 months Fisher, Kirstie Peri, MD [facility]            Signed Prescriptions Disp Refills   ezetimibe (ZETIA) 10 MG tablet 90 tablet 1    Sig: TAKE 1 TABLET EVERY DAY     Cardiovascular:  Antilipid - Sterol Transport Inhibitors Failed - 09/24/2022  3:05 AM      Failed - Lipid Panel in normal range within the last 12 months    Cholesterol, Total  Date Value Ref Range Status  08/19/2022 195 100 - 199 mg/dL Final   LDL Chol Calc (NIH)  Date Value Ref Range Status  08/19/2022 133 (H) 0 - 99 mg/dL Final   HDL  Date Value Ref Range Status  08/19/2022 39 (L) >39 mg/dL Final   Triglycerides  Date Value Ref  Range Status  08/19/2022 126 0 - 149 mg/dL Final         Passed - AST in normal range and within 360 days    AST  Date Value Ref Range Status  08/29/2022 28 15 - 41 U/L Final         Passed - ALT in normal range and within 360 days    ALT  Date Value Ref Range Status  08/29/2022 22 0 - 44 U/L Final         Passed - Patient is not pregnant      Passed - Valid encounter within last 12 months    Recent Outpatient Visits           3 days ago B12 deficiency   [facility] Birdie Sons, MD   1 month ago Primary hypertension   [facility] Birdie Sons, MD   1 month ago Non-seasonal allergic rhinitis due to pollen   [place], Kirstie Peri, MD   1 year ago  Memory change   [facility] Birdie Sons, MD   1 year ago No-show for appointment   [place], MD       Future Appointments             In 3 months Fisher, Kirstie Peri, MD [facility]               Requested Prescriptions  Pending Prescriptions Disp Refills   amLODipine (NORVASC) 5 MG tablet [Pharmacy Med Name: AMLODIPINE BESYLATE 5 MG Tablet] 90 tablet 3    Sig: TAKE 1 TABLET EVERY DAY     Cardiovascular: Calcium Channel Blockers 2 Failed - 09/24/2022  3:05 AM      Failed - Last BP in normal range    BP Readings from Last 1 Encounters:  09/21/22 (!) 147/68         Passed - Last Heart Rate in normal range    Pulse Readings from Last 1 Encounters:  09/21/22 84         Passed - Valid encounter within last 6 months    Recent Outpatient Visits           3 days ago B12 deficiency   [facility] Birdie Sons, MD   1 month ago Primary hypertension   [facility] Birdie Sons, MD   1 month ago Non-seasonal allergic rhinitis due to pollen   [facility] Birdie Sons, MD   1 year ago Memory change   [facility]  Birdie Sons, MD   1 year ago No-show for appointment   [facility] Birdie Sons, MD       Future Appointments             In 3 months Fisher, Kirstie Peri, MD [facility]            Signed Prescriptions Disp Refills   ezetimibe (ZETIA) 10 MG tablet 90 tablet 1    Sig: TAKE 1 TABLET EVERY DAY     Cardiovascular:  Antilipid - Sterol Transport Inhibitors Failed - 09/24/2022  3:05 AM      Failed - Lipid Panel in normal range within the last 12 months    Cholesterol, Total  Date Value Ref Range Status  08/19/2022 195 100 - 199 mg/dL Final   LDL Chol Calc (NIH)  Date Value Ref Range Status  08/19/2022 133 (H) 0 - 99 mg/dL Final   HDL  Date Value Ref Range Status  08/19/2022 39 (L) >39 mg/dL Final   Triglycerides  Date Value Ref Range Status  08/19/2022 126 0 - 149 mg/dL Final         Passed - AST in normal range and within 360 days    AST  Date Value Ref Range Status  08/29/2022 28 15 - 41 U/L Final         Passed - ALT in normal range and within 360 days    ALT  Date Value Ref Range Status  08/29/2022 22 0 - 44 U/L Final         Passed - Patient is not pregnant      Passed - Valid encounter within last 12 months    Recent Outpatient Visits           3 days ago B12 deficiency   [facility] Birdie Sons, MD   1 month ago Primary hypertension  [facility] Birdie Sons, MD   1 month ago Non-seasonal allergic rhinitis due to pollen   [place], Kirstie Peri, MD   1 year ago Memory change   [facility] Birdie Sons, MD   1 year ago No-show for appointment   [place], MD       Future Appointments             In 3 months Fisher, Kirstie Peri, MD [facility] [place]

## 2022-10-21 ENCOUNTER — Other Ambulatory Visit: Payer: Self-pay | Admitting: Family Medicine

## 2022-10-21 DIAGNOSIS — F03B18 Unspecified dementia, moderate, with other behavioral disturbance: Secondary | ICD-10-CM

## 2022-10-21 MED ORDER — RIVASTIGMINE TARTRATE 4.5 MG PO CAPS: 4.5000 mg | ORAL_CAPSULE | Freq: Two times a day (BID) | ORAL | 1 refills | Status: DC

## 2022-11-01 ENCOUNTER — Other Ambulatory Visit: Payer: Self-pay | Admitting: Family Medicine

## 2022-11-03 ENCOUNTER — Other Ambulatory Visit: Payer: Self-pay | Admitting: Family Medicine

## 2022-11-03 DIAGNOSIS — E1149 Type 2 diabetes mellitus with other diabetic neurological complication: Secondary | ICD-10-CM

## 2022-11-20 ENCOUNTER — Other Ambulatory Visit: Payer: Self-pay | Admitting: Family Medicine

## 2022-11-20 DIAGNOSIS — F03B18 Unspecified dementia, moderate, with other behavioral disturbance: Secondary | ICD-10-CM

## 2022-11-20 NOTE — Telephone Encounter (Signed)
Requested medication (s) are due for refill today: No  Requested medication (s) are on the active medication list: Yes  Last refill:  10/21/22  Future visit scheduled:  Yes  Notes to clinic:  Pharmacy requests 90 day supply.    Requested Prescriptions  Pending Prescriptions Disp Refills   rivastigmine (EXELON) 4.5 MG capsule [Pharmacy Med Name: RIVASTIGMINE 4.5 MG CAPSULE] 180 capsule 1    Sig: Take 1 capsule (4.5 mg total) by mouth 2 (two) times daily.     Neurology:  Alzheimer's Agents Passed - 11/20/2022  9:30 AM      Passed - Valid encounter within last 6 months    Recent Outpatient Visits           2 months ago B12 deficiency   [facility] Birdie Sons, MD   3 months ago Primary hypertension   [place], Donald E, MD   3 months ago Non-seasonal allergic rhinitis due to pollen   [place], Kirstie Peri, MD   1 year ago Memory change   [facility] Birdie Sons, MD   1 year ago No-show for appointment   [facility] Birdie Sons, MD       Future Appointments             In 1 month [place], Kirstie Peri, MD [facility]

## 2022-12-07 ENCOUNTER — Other Ambulatory Visit: Payer: Self-pay | Admitting: Family Medicine

## 2022-12-08 NOTE — Telephone Encounter (Signed)
Requested Prescriptions  Pending Prescriptions Disp Refills   oxybutynin (DITROPAN) 5 MG tablet [Pharmacy Med Name: OXYBUTYNIN CHLORIDE 5 MG Tablet] 270 tablet 0    Sig: TAKE 1 TABLET 2 TO 3 TIMES DAILY AS NEEDED FOR URINARY FREQUENCY     Urology:  Bladder Agents Passed - 12/07/2022  1:50 AM      Passed - Valid encounter within last 12 months    Recent Outpatient Visits           2 months ago B12 deficiency   [facility] Birdie Sons, MD   3 months ago Primary hypertension   [place], Donald E, MD   3 months ago Non-seasonal allergic rhinitis due to pollen   [facility] Caryn Section, Kirstie Peri, MD   1 year ago Memory change   [facility] Birdie Sons, MD   1 year ago No-show for appointment   [facility] Birdie Sons, MD       Future Appointments             In 4 weeks Fisher, Kirstie Peri, MD [facility]

## 2023-01-06 ENCOUNTER — Encounter: Payer: Self-pay | Admitting: Family Medicine

## 2023-01-06 ENCOUNTER — Telehealth: Payer: Self-pay

## 2023-01-06 ENCOUNTER — Telehealth (INDEPENDENT_AMBULATORY_CARE_PROVIDER_SITE_OTHER): Payer: Medicare HMO | Admitting: Family Medicine

## 2023-01-06 DIAGNOSIS — K591 Functional diarrhea: Secondary | ICD-10-CM

## 2023-01-06 DIAGNOSIS — Z7984 Long term (current) use of oral hypoglycemic drugs: Secondary | ICD-10-CM

## 2023-01-06 DIAGNOSIS — E039 Hypothyroidism, unspecified: Secondary | ICD-10-CM | POA: Diagnosis not present

## 2023-01-06 DIAGNOSIS — E1149 Type 2 diabetes mellitus with other diabetic neurological complication: Secondary | ICD-10-CM

## 2023-01-06 DIAGNOSIS — I10 Essential (primary) hypertension: Secondary | ICD-10-CM | POA: Diagnosis not present

## 2023-01-06 MED ORDER — GLIPIZIDE ER 5 MG PO TB24: 5.0000 mg | ORAL_TABLET | Freq: Every day | ORAL | 1 refills | Status: DC

## 2023-01-06 MED ORDER — EMPAGLIFLOZIN 10 MG PO TABS: 10.0000 mg | ORAL_TABLET | Freq: Every day | ORAL | 1 refills | Status: DC

## 2023-01-06 NOTE — Telephone Encounter (Signed)
Copied from CRM [id number]. Topic: Appointment Scheduling - Scheduling Inquiry for Clinic >> Jan 06, 2023  8:24 AM Franchot Heidelberg wrote: Reason for CRM: Pt's daughter called reporting that the patient would prefer to have a virtual visit today, does PCP approve? If so please advise

## 2023-01-06 NOTE — Progress Notes (Signed)
I,Sulibeya S Dimas,acting as a Neurosurgeon for [name] Merry, MD.,have documented all relevant documentation on the behalf of [name] Merry, MD,as directed by  [name] Merry, MD    MyChart Video Visit    Virtual Visit via Video Note   This format is felt to be most appropriate for this patient at this time. Physical exam was limited by quality of the video and audio technology used for the visit.   Patient location: home Provider location: [facility]  I discussed the limitations of evaluation and management by telemedicine and the availability of in person appointments. The patient expressed understanding and agreed to proceed.  Patient: [name] [name]   DOB: 08-10-1933   89 y.o. Female  MRN: [id number] Visit Date: 01/06/2023  Today's healthcare provider: Mila Merry, MD   Chief Complaint  Patient presents with   Diabetes   Subjective    HPI  Diabetes Mellitus Type II, Follow-up  Lab Results  Component Value Date   HGBA1C 7.3 (H) 08/19/2022   HGBA1C 6.5 (A) 05/05/2021   HGBA1C 6.6 (H) 11/05/2020   Wt Readings from Last 3 Encounters:  09/21/22 182 lb 12.8 oz (82.9 kg)  09/02/22 183 lb (83 kg)  08/19/22 183 lb (83 kg)   Last seen for diabetes 3 months ago.  Management since then includes no changes. She reports excellent compliance with treatment. She is having side effects. Diarrhea and constipation on and off x 1 month. Daughter Rosey Bath is requesting to change metformin to jardiance due to bowel changes.  Rosey Bath is also wanting to know if patient still needs to take glipizide 10 mg.   Home blood sugar records: fasting range: 130s  Episodes of hypoglycemia? No    Current insulin regiment: none Most Recent Eye Exam: more than two years. Daughter reports she will schedule appt.  Pertinent Labs: Lab Results  Component Value Date   CHOL 195 08/19/2022   HDL 39 (L) 08/19/2022   LDLCALC 133 (H) 08/19/2022   TRIG 126 08/19/2022   CHOLHDL 5.0 (H) 08/19/2022   Lab  Results  Component Value Date   NA 141 08/29/2022   K 4.0 08/29/2022   CREATININE 0.83 08/29/2022   GFRNONAA >60 08/29/2022   MICRALBCREAT n/a 02/26/2017     ---------------------------------------------------------------------------------------------------    Medications: Outpatient Medications Prior to Visit  Medication Sig   Accu-Chek Softclix Lancets lancets USE TO CHECK BLOOD SUGAR ONCE A DAY   Alcohol Swabs (DROPSAFE ALCOHOL PREP) 70 % PADS USE TO TEST BLOOD SUGAR EVERY DAY   amLODipine (NORVASC) 5 MG tablet TAKE 1 TABLET EVERY DAY   Blood Glucose Monitoring Suppl (ACCU-CHEK AVIVA PLUS) w/Device KIT USE TO CHECK BLOOD SUGAR ONCE A DAY   DULoxetine (CYMBALTA) 30 MG capsule TAKE 1 CAPSULE EVERY DAY   ezetimibe (ZETIA) 10 MG tablet TAKE 1 TABLET EVERY DAY   fluticasone (FLONASE) 50 MCG/ACT nasal spray PLACE 2 SPRAYS INTO BOTH NOSTRILS DAILY.   furosemide (LASIX) 20 MG tablet TAKE 1 TABLET EVERY DAY   gabapentin (NEURONTIN) 100 MG capsule TAKE 1 CAPSULE EVERY MORNING   gabapentin (NEURONTIN) 300 MG capsule TAKE 1 CAPSULE AT BEDTIME (IN ADDITION TO THE  CAPSULE IN THE MORNING)   glipiZIDE (GLUCOTROL XL) 10 MG 24 hr tablet TAKE 1 TABLET EVERY DAY   glucose blood (ACCU-CHEK AVIVA PLUS) test strip TEST BLOOD SUGAR ONE TIME DAILY   levothyroxine (SYNTHROID) 75 MCG tablet TAKE 1 TABLET EVERY DAY   metFORMIN (GLUCOPHAGE-XR) 750 MG 24 hr tablet TAKE 1 TABLET  TWICE DAILY   Multiple Vitamins-Minerals (DAILY MULTIVITAMIN PO) Take 1 tablet by mouth.   oxybutynin (DITROPAN) 5 MG tablet TAKE 1 TABLET 2 TO 3 TIMES DAILY AS NEEDED FOR URINARY FREQUENCY   rivastigmine (EXELON) 4.5 MG capsule TAKE 1 CAPSULE (4.5 MG TOTAL) BY MOUTH 2 (TWO) TIMES DAILY.   tiZANidine (ZANAFLEX) 2 MG tablet Take 2 tablets (4 mg total) by mouth every 8 (eight) hours as needed for muscle spasms.   valsartan (DIOVAN) 160 MG tablet Take 1 tablet (160 mg total) by mouth daily.   ALPRAZolam (XANAX) 0.25 MG tablet Take  1 tablet (0.25 mg total) by mouth every 6 (six) hours as needed for anxiety. (Patient not taking: Reported on 01/06/2023)   No facility-administered medications prior to visit.    Review of Systems  Constitutional:  Positive for activity change. Negative for appetite change, chills, fever and unexpected weight change.  Eyes:  Negative for visual disturbance.  Respiratory:  Negative for cough, chest tightness, shortness of breath and wheezing.   Cardiovascular:  Negative for chest pain and leg swelling.  Gastrointestinal:  Positive for abdominal distention, abdominal pain, constipation and diarrhea. Negative for blood in stool, nausea and vomiting.  Psychiatric/Behavioral:  Positive for confusion and decreased concentration.        Objective    There were no vitals taken for this visit.     Physical Exam   Awake, alert, oriented x 2. In no apparent distress   Assessment & Plan     1. Functional diarrhea Her daughter is concerned this may be a side effect of metformin which we will put on hold for the time being.   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    reduce glipiZIDE (GLUCOTROL XL) to 5 MG 24 hr tablet; Take 1 tablet (5 mg total) by mouth daily.  Dispense: 30 tablet; Refill: 1  Is to go to labs in 3-4 weeks, a few days before her next appt.  - Renal function panel - Hemoglobin A1c  3. Primary hypertension She will call back with readings.   4. Hypothyroidism, unspecified type  - TSH - T4, free  Is to go to labs in 3-4 weeks, a few days before her next appt.   Other orders   No follow-ups on file.     I discussed the assessment and treatment plan with the patient. The patient was provided an opportunity to ask questions and all were answered. The patient agreed with the plan and  demonstrated an understanding of the instructions.   The patient was advised to call back or seek an in-person evaluation if the symptoms worsen or if the condition fails to improve as anticipated.  I provided 12 minutes of non-face-to-face time during this encounter.  The entirety of the information documented in the History of Present Illness, Review of Systems and Physical Exam were personally obtained by me. Portions of this information were initially documented by the CMA and reviewed by me for thoroughness and accuracy.    [name] Merry, MD [facility] [phone] (phone) [phone] (fax)  [facility]

## 2023-01-06 NOTE — Telephone Encounter (Signed)
Appt changed to virtual °

## 2023-01-30 ENCOUNTER — Other Ambulatory Visit: Payer: Self-pay | Admitting: Family Medicine

## 2023-01-30 DIAGNOSIS — E1149 Type 2 diabetes mellitus with other diabetic neurological complication: Secondary | ICD-10-CM

## 2023-02-01 NOTE — Telephone Encounter (Signed)
Requested medication (s) are due for refill today:   No  Requested medication (s) are on the active medication list:   Yes  Future visit scheduled:   Yes 02/05/2023   Last ordered: 01/06/2023 #30, 1 refill  Returned because a 90 day supply and a DX Code is being requested    Requested Prescriptions  Pending Prescriptions Disp Refills   glipiZIDE (GLUCOTROL XL) 5 MG 24 hr tablet [Pharmacy Med Name: GLIPIZIDE ER 5 MG TABLET] 90 tablet 1    Sig: Take 1 tablet (5 mg total) by mouth daily.     Endocrinology:  Diabetes - Sulfonylureas Passed - 01/30/2023  8:32 AM      Passed - HBA1C is between 0 and 7.9 and within 180 days    Hgb A1c MFr Bld  Date Value Ref Range Status  08/19/2022 7.3 (H) 4.8 - 5.6 % Final    Comment:             Prediabetes: 5.7 - 6.4          Diabetes: >6.4          Glycemic control for adults with diabetes: <7.0          Passed - Cr in normal range and within 360 days    Creat  Date Value Ref Range Status  07/06/2017 0.90 (H) 0.60 - 0.88 mg/dL Final    Comment:    For patients >46 years of age, the reference limit for Creatinine is approximately 13% higher for people identified as African-American. .    Creatinine, Ser  Date Value Ref Range Status  08/29/2022 0.83 0.44 - 1.00 mg/dL Final   Creatinine, POC  Date Value Ref Range Status  02/26/2017 n/a mg/dL Final         Passed - Valid encounter within last 6 months    Recent Outpatient Visits           3 weeks ago Functional diarrhea   [facility] Malva Limes, MD   4 months ago B12 deficiency   [facility] Malva Limes, MD   5 months ago Primary hypertension   [place] [facility] Malva Limes, MD   5 months ago Non-seasonal allergic rhinitis due to pollen   [facility] Sherrie Mustache, Demetrios Isaacs, MD   1 year ago Memory change   [facility] Malva Limes, MD        Future Appointments             In 4 days Fisher, Demetrios Isaacs, MD [facility]

## 2023-02-04 DIAGNOSIS — E1149 Type 2 diabetes mellitus with other diabetic neurological complication: Secondary | ICD-10-CM | POA: Diagnosis not present

## 2023-02-04 DIAGNOSIS — E039 Hypothyroidism, unspecified: Secondary | ICD-10-CM | POA: Diagnosis not present

## 2023-02-04 NOTE — Progress Notes (Signed)
I,Sulibeya S Dimas,acting as a scribe for Mila Merry, MD.,have documented all relevant documentation on the behalf of Mila Merry, MD,as directed by  Mila Merry, MD      Established patient visit   Patient: [name] [name]   DOB: May 03, 1933   89 y.o. Female  MRN: [id number] Visit Date: 02/05/2023  Today's healthcare provider: Mila Merry, MD   Chief Complaint  Patient presents with   Diabetes   Hypertension   Subjective    HPI  Patient is here today with daughter Michaell Cowing. Daughter reports that patient does not want to take medications daily.  Diabetes Mellitus Type II, follow-up  Lab Results  Component Value Date   HGBA1C 9.0 (H) 02/04/2023   HGBA1C 7.3 (H) 08/19/2022   HGBA1C 6.5 (A) 05/05/2021   Last seen for diabetes 4 weeks ago.  Management since then includes hold metformin due to diarrhea. Start Jardiance 10 mg daily.  She reports fair compliance with treatment. She is not having side effects.   Home blood sugar records: fasting range: not being checked  Episodes of hypoglycemia? No    Current insulin regiment: none Most Recent Eye Exam: will schedule  --------------------------------------------------------------------------------------------------- Hypertension, follow-up  BP Readings from Last 3 Encounters:  02/05/23 136/66  09/21/22 (!) 147/68  08/29/22 (!) 160/72   Wt Readings from Last 3 Encounters:  02/05/23 179 lb 4.8 oz (81.3 kg)  09/21/22 182 lb 12.8 oz (82.9 kg)  09/02/22 183 lb (83 kg)     She was last seen for hypertension 4 weeks ago.  BP at that visit was none. Virtual visit Management since that visit includes no changes. She reports fair compliance with treatment. She is not having side effects.   Outside blood pressures are not being checked.  --------------------------------------------------------------------------------------------------- She had extensive labs drawn yesterday as below.    Medications: Outpatient Medications Prior to Visit  Medication Sig   Accu-Chek Softclix Lancets lancets USE TO CHECK BLOOD SUGAR ONCE A DAY   Alcohol Swabs (DROPSAFE ALCOHOL PREP) 70 % PADS USE TO TEST BLOOD SUGAR EVERY DAY   ALPRAZolam (XANAX) 0.25 MG tablet Take 1 tablet (0.25 mg total) by mouth every 6 (six) hours as needed for anxiety. (Patient not taking: Reported on 01/06/2023)   amLODipine (NORVASC) 5 MG tablet TAKE 1 TABLET EVERY DAY   Blood Glucose Monitoring Suppl (ACCU-CHEK AVIVA PLUS) w/Device KIT USE TO CHECK BLOOD SUGAR ONCE A DAY   DULoxetine (CYMBALTA) 30 MG capsule TAKE 1 CAPSULE EVERY DAY   empagliflozin (JARDIANCE) 10 MG TABS tablet Take 1 tablet (10 mg total) by mouth daily before breakfast.   ezetimibe (ZETIA) 10 MG tablet TAKE 1 TABLET EVERY DAY   fluticasone (FLONASE) 50 MCG/ACT nasal spray PLACE 2 SPRAYS INTO BOTH NOSTRILS DAILY.   furosemide (LASIX) 20 MG tablet TAKE 1 TABLET EVERY DAY   gabapentin (NEURONTIN) 100 MG capsule TAKE 1 CAPSULE EVERY MORNING   gabapentin (NEURONTIN) 300 MG capsule TAKE 1 CAPSULE AT BEDTIME (IN ADDITION TO THE 100MG  CAPSULE IN THE MORNING)   glipiZIDE (GLUCOTROL XL) 5 MG 24 hr tablet TAKE 1 TABLET (5 MG TOTAL) BY MOUTH DAILY.   glucose blood (ACCU-CHEK AVIVA PLUS) test strip TEST BLOOD SUGAR ONE TIME DAILY   levothyroxine (SYNTHROID) 75 MCG tablet TAKE 1 TABLET EVERY DAY   Multiple Vitamins-Minerals (DAILY MULTIVITAMIN PO) Take 1 tablet by mouth.   oxybutynin (DITROPAN) 5 MG tablet TAKE 1 TABLET 2 TO 3 TIMES DAILY AS NEEDED FOR URINARY FREQUENCY  rivastigmine (EXELON) 4.5 MG capsule TAKE 1 CAPSULE (4.5 MG TOTAL) BY MOUTH 2 (TWO) TIMES DAILY.   tiZANidine (ZANAFLEX) 2 MG tablet Take 2 tablets (4 mg total) by mouth every 8 (eight) hours as needed for muscle spasms.   valsartan (DIOVAN) 160 MG tablet Take 1 tablet (160 mg total) by mouth daily.   No facility-administered medications prior to visit.    Review of Systems  Constitutional:   Positive for activity change and appetite change.  Respiratory: Negative.    Cardiovascular: Negative.   Neurological:  Negative for dizziness, tremors and numbness.  Psychiatric/Behavioral:  Positive for sleep disturbance.     Last CBC Lab Results  Component Value Date   WBC 7.2 08/29/2022   HGB 11.8 (L) 08/29/2022   HCT 37.7 08/29/2022   MCV 86.3 08/29/2022   MCH 27.0 08/29/2022   RDW 14.0 08/29/2022   PLT 297 08/29/2022   Last metabolic panel Lab Results  Component Value Date   GLUCOSE 232 (H) 02/04/2023   NA 143 02/04/2023   K 4.0 02/04/2023   CL 104 02/04/2023   CO2 21 02/04/2023   BUN 8 02/04/2023   CREATININE 0.87 02/04/2023   EGFR 64 02/04/2023   CALCIUM 9.7 02/04/2023   PHOS 3.2 02/04/2023   PROT 7.8 08/29/2022   ALBUMIN 4.2 02/04/2023   LABGLOB 3.5 08/19/2022   AGRATIO 1.1 (L) 08/19/2022   BILITOT 0.9 08/29/2022   ALKPHOS 50 08/29/2022   AST 28 08/29/2022   ALT 22 08/29/2022   ANIONGAP 10 08/29/2022   Last lipids Lab Results  Component Value Date   CHOL 195 08/19/2022   HDL 39 (L) 08/19/2022   LDLCALC 133 (H) 08/19/2022   TRIG 126 08/19/2022   CHOLHDL 5.0 (H) 08/19/2022   Last thyroid functions Lab Results  Component Value Date   TSH 0.281 (L) 02/04/2023   Last vitamin D Lab Results  Component Value Date   VD25OH 37.2 08/19/2022   Last vitamin B12 and Folate Lab Results  Component Value Date   VITAMINB12 344 08/19/2022       Objective    BP 136/66 (BP Location: Left Arm, Patient Position: Sitting, Cuff Size: Large)   Pulse 73   Temp 98 F (36.7 C) (Temporal)   Resp 20   Wt 179 lb 4.8 oz (81.3 kg)   SpO2 98%   BMI 29.84 kg/m    Physical Exam   General: Appearance:    Well developed, well nourished female in no acute distress. Affect is flat  Eyes:    PERRL, conjunctiva/corneas clear, EOM's intact       Lungs:     Clear to auscultation bilaterally, respirations unlabored  Heart:    Normal heart rate. Normal rhythm. No  murmurs, rubs, or gallops.    MS:   All extremities are intact.    Neurologic:   Awake, alert, oriented x 3. No apparent focal neurological defect.         Assessment & Plan     1. Moderate dementia with other behavioral disturbance, unspecified dementia type (HCC) Will increase exelon to 6mg  BID with next refill.   2. Diabetes mellitus type 2 with neurological manifestations (HCC) Stomach better since change from metformin to Jardiance, but A1c is up considerably, although her daughter doesn't think patient is taking medications consistently. Will increase Jardiance to 25mg  with next refill.   3. Depression, major, single episode, moderate (HCC) Will increase duloxetine to 60mg  with next refill.   4. Hypersomnia Likely  sleep apnea as her daughter reports very restless abnormal breathing with sleep .   5. Primary hypertension Well controlled.  Continue current medications.    6. Hyperlipidemia, unspecified hyperlipidemia type Continue ezetimibe.   7. Hypothyroidism, unspecified type Clinically euthyroid.       The entirety of the information documented in the History of Present Illness, Review of Systems and Physical Exam were personally obtained by me. Portions of this information were initially documented by the CMA and reviewed by me for thoroughness and accuracy.     Mila Merry, MD  [facility] [phone] (phone) [phone] (fax)  [facility]

## 2023-02-05 ENCOUNTER — Ambulatory Visit (INDEPENDENT_AMBULATORY_CARE_PROVIDER_SITE_OTHER): Payer: Medicare HMO | Admitting: Family Medicine

## 2023-02-05 ENCOUNTER — Encounter: Payer: Self-pay | Admitting: Family Medicine

## 2023-02-05 VITALS — BP 136/66 | HR 73 | Temp 98.0°F | Resp 20 | Wt 179.3 lb

## 2023-02-05 DIAGNOSIS — G4710 Hypersomnia, unspecified: Secondary | ICD-10-CM

## 2023-02-05 DIAGNOSIS — E785 Hyperlipidemia, unspecified: Secondary | ICD-10-CM

## 2023-02-05 DIAGNOSIS — F03B18 Unspecified dementia, moderate, with other behavioral disturbance: Secondary | ICD-10-CM | POA: Diagnosis not present

## 2023-02-05 DIAGNOSIS — F321 Major depressive disorder, single episode, moderate: Secondary | ICD-10-CM | POA: Diagnosis not present

## 2023-02-05 DIAGNOSIS — E039 Hypothyroidism, unspecified: Secondary | ICD-10-CM

## 2023-02-05 DIAGNOSIS — I10 Essential (primary) hypertension: Secondary | ICD-10-CM

## 2023-02-05 DIAGNOSIS — E1149 Type 2 diabetes mellitus with other diabetic neurological complication: Secondary | ICD-10-CM

## 2023-02-05 LAB — RENAL FUNCTION PANEL
Albumin: 4.2 g/dL (ref 3.7–4.7)
BUN/Creatinine Ratio: 9 — ABNORMAL LOW (ref 12–28)
BUN: 8 mg/dL (ref 8–27)
CO2: 21 mmol/L (ref 20–29)
Calcium: 9.7 mg/dL (ref 8.7–10.3)
Chloride: 104 mmol/L (ref 96–106)
Creatinine, Ser: 0.87 mg/dL (ref 0.57–1.00)
Glucose: 232 mg/dL — ABNORMAL HIGH (ref 70–99)
Phosphorus: 3.2 mg/dL (ref 3.0–4.3)
Potassium: 4 mmol/L (ref 3.5–5.2)
Sodium: 143 mmol/L (ref 134–144)
eGFR: 64 mL/min/{1.73_m2} (ref >59)

## 2023-02-05 LAB — TSH: TSH: 0.281 u[IU]/mL — ABNORMAL LOW (ref 0.450–4.500)

## 2023-02-05 LAB — HEMOGLOBIN A1C
Est. average glucose Bld gHb Est-mCnc: 212 mg/dL
Hgb A1c MFr Bld: 9 % — ABNORMAL HIGH (ref 4.8–5.6)

## 2023-02-05 LAB — T4, FREE: Free T4: 1.37 ng/dL (ref 0.82–1.77)

## 2023-02-05 NOTE — Patient Instructions (Signed)
.   Please review the attached list of medications and notify my office if there are any errors.   . Please bring all of your medications to every appointment so we can make sure that our medication list is the same as yours.   

## 2023-02-05 NOTE — Addendum Note (Signed)
Addended by: Malva Limes on: 02/05/2023 05:02 PM   Modules accepted: Orders

## 2023-02-09 ENCOUNTER — Telehealth: Payer: Self-pay

## 2023-02-09 DIAGNOSIS — F03B18 Unspecified dementia, moderate, with other behavioral disturbance: Secondary | ICD-10-CM

## 2023-02-09 DIAGNOSIS — E1149 Type 2 diabetes mellitus with other diabetic neurological complication: Secondary | ICD-10-CM

## 2023-02-09 DIAGNOSIS — M5136 Other intervertebral disc degeneration, lumbar region: Secondary | ICD-10-CM

## 2023-02-09 DIAGNOSIS — R252 Cramp and spasm: Secondary | ICD-10-CM

## 2023-02-09 DIAGNOSIS — M159 Polyosteoarthritis, unspecified: Secondary | ICD-10-CM

## 2023-02-09 NOTE — Telephone Encounter (Signed)
Copied from CRM [id number]. Topic: General - Other >> Feb 09, 2023 10:41 AM Franchot Heidelberg wrote: Reason for CRM: Pt' daughter called to report that the patient is missing medication following her recent visit with her PCP. Pt's daughter is requesting to speak to PCP's nurse, has questions.  She says that she is unable to see the AVS as well which is atypical

## 2023-02-10 MED ORDER — RIVASTIGMINE TARTRATE 6 MG PO CAPS: 6.0000 mg | ORAL_CAPSULE | Freq: Two times a day (BID) | ORAL | 3 refills | Status: DC

## 2023-02-10 MED ORDER — DULOXETINE HCL 60 MG PO CPEP: 60.0000 mg | ORAL_CAPSULE | Freq: Every day | ORAL | 3 refills | Status: DC

## 2023-02-10 MED ORDER — EMPAGLIFLOZIN 25 MG PO TABS: 25.0000 mg | ORAL_TABLET | Freq: Every day | ORAL | 5 refills | Status: DC

## 2023-02-10 NOTE — Addendum Note (Signed)
Addended by: Malva Limes on: 02/10/2023 03:52 PM   Modules accepted: Orders

## 2023-02-12 ENCOUNTER — Other Ambulatory Visit: Payer: Self-pay | Admitting: Family Medicine

## 2023-02-16 ENCOUNTER — Other Ambulatory Visit: Payer: Self-pay | Admitting: Family Medicine

## 2023-02-16 NOTE — Telephone Encounter (Signed)
Unable to refill per protocol, Rx request is too soon. Last refill 02/12/23 for 30 and 3 refills.  Requested Prescriptions  Pending Prescriptions Disp Refills   levothyroxine (SYNTHROID) 75 MCG tablet [Pharmacy Med Name: LEVOTHYROXINE 75 MCG TABLET] 30 tablet 3    Sig: TAKE 1 TABLET BY MOUTH EVERY DAY     Endocrinology:  Hypothyroid Agents Failed - 02/16/2023 12:27 PM      Failed - TSH in normal range and within 360 days    TSH  Date Value Ref Range Status  02/04/2023 0.281 (L) 0.450 - 4.500 uIU/mL Final         Passed - Valid encounter within last 12 months    Recent Outpatient Visits           1 week ago Moderate dementia with other behavioral disturbance, unspecified dementia type [facility])   [facility] Malva Limes, MD   1 month ago Functional diarrhea   [place] Malva Limes, MD   4 months ago B12 deficiency   [facility] Malva Limes, MD   6 months ago Primary hypertension   [facility] Malva Limes, MD   6 months ago Non-seasonal allergic rhinitis due to pollen   [facility] Sherrie Mustache, Demetrios Isaacs, MD       Future Appointments             In 2 months Fisher, Demetrios Isaacs, MD [facility]

## 2023-02-19 ENCOUNTER — Other Ambulatory Visit: Payer: Self-pay | Admitting: Family Medicine

## 2023-02-19 NOTE — Telephone Encounter (Signed)
Requested Prescriptions  Pending Prescriptions Disp Refills   oxybutynin (DITROPAN) 5 MG tablet [Pharmacy Med Name: OXYBUTYNIN CHLORIDE 5 MG Tablet] 270 tablet 3    Sig: TAKE 1 TABLET 2 TO 3 TIMES DAILY AS NEEDED FOR URINARY FREQUENCY     Urology:  Bladder Agents Passed - 02/19/2023  1:48 AM      Passed - Valid encounter within last 12 months    Recent Outpatient Visits           2 weeks ago Moderate dementia with other behavioral disturbance, unspecified dementia type (HCC)   [facility] Malva Limes, MD   1 month ago Functional diarrhea   [facility] Malva Limes, MD   5 months ago B12 deficiency   [facility] Malva Limes, MD   6 months ago Primary hypertension   [facility] Malva Limes, MD   6 months ago Non-seasonal allergic rhinitis due to pollen   [facility] Sherrie Mustache, Demetrios Isaacs, MD       Future Appointments             In 2 months Fisher, Demetrios Isaacs, MD [facility]             ezetimibe (ZETIA) 10 MG tablet [Pharmacy Med Name: EZETIMIBE 10 MG Tablet] 90 tablet 3    Sig: TAKE 1 TABLET EVERY DAY     Cardiovascular:  Antilipid - Sterol Transport Inhibitors Failed - 02/19/2023  1:48 AM      Failed - Lipid Panel in normal range within the last 12 months    Cholesterol, Total  Date Value Ref Range Status  08/19/2022 195 100 - 199 mg/dL Final   LDL Chol Calc (NIH)  Date Value Ref Range Status  08/19/2022 133 (H) 0 - 99 mg/dL Final   HDL  Date Value Ref Range Status  08/19/2022 39 (L) >39 mg/dL Final   Triglycerides  Date Value Ref Range Status  08/19/2022 126 0 - 149 mg/dL Final         Passed - AST in normal range and within 360 days    AST  Date Value Ref Range Status  08/29/2022 28 15 - 41 U/L Final         Passed - ALT in normal range and within 360 days    ALT  Date Value  Ref Range Status  08/29/2022 22 0 - 44 U/L Final         Passed - Patient is not pregnant      Passed - Valid encounter within last 12 months    Recent Outpatient Visits           2 weeks ago Moderate dementia with other behavioral disturbance, unspecified dementia type [facility])   [facility] Malva Limes, MD   1 month ago Functional diarrhea   [facility] Malva Limes, MD   5 months ago B12 deficiency   [facility] Malva Limes, MD   6 months ago Primary hypertension   [place] [facility] Malva Limes, MD   6 months ago Non-seasonal allergic rhinitis due to pollen   [facility] Sherrie Mustache, Demetrios Isaacs, MD       Future Appointments             In 2 months  Malva Limes, MD [facility]

## 2023-03-11 ENCOUNTER — Other Ambulatory Visit: Payer: Self-pay | Admitting: Family Medicine

## 2023-03-11 DIAGNOSIS — I10 Essential (primary) hypertension: Secondary | ICD-10-CM

## 2023-03-12 NOTE — Telephone Encounter (Signed)
Requested Prescriptions  Pending Prescriptions Disp Refills   valsartan (DIOVAN) 160 MG tablet [Pharmacy Med Name: VALSARTAN 160 MG TABLET] 90 tablet 1    Sig: TAKE 1 TABLET BY MOUTH EVERY DAY     Cardiovascular:  Angiotensin Receptor Blockers Passed - 03/11/2023  2:40 AM      Passed - Cr in normal range and within 180 days    Creat  Date Value Ref Range Status  07/06/2017 0.90 (H) 0.60 - 0.88 mg/dL Final    Comment:    For patients >87 years of age, the reference limit for Creatinine is approximately 13% higher for people identified as African-American. .    Creatinine, Ser  Date Value Ref Range Status  02/04/2023 0.87 0.57 - 1.00 mg/dL Final   Creatinine, POC  Date Value Ref Range Status  02/26/2017 n/a mg/dL Final         Passed - K in normal range and within 180 days    Potassium  Date Value Ref Range Status  02/04/2023 4.0 3.5 - 5.2 mmol/L Final         Passed - Patient is not pregnant      Passed - Last BP in normal range    BP Readings from Last 1 Encounters:  02/05/23 136/66         Passed - Valid encounter within last 6 months    Recent Outpatient Visits           1 month ago Moderate dementia with other behavioral disturbance, unspecified dementia type [facility])   [facility] Malva Limes, MD   2 months ago Functional diarrhea   [facility] Malva Limes, MD   5 months ago B12 deficiency   [facility] Malva Limes, MD   6 months ago Primary hypertension   [place] [facility] Malva Limes, MD   6 months ago Non-seasonal allergic rhinitis due to pollen   [facility] Sherrie Mustache, Demetrios Isaacs, MD       Future Appointments             In 1 month Fisher, Demetrios Isaacs, MD [facility]

## 2023-03-19 ENCOUNTER — Telehealth: Payer: Self-pay | Admitting: Family Medicine

## 2023-03-19 DIAGNOSIS — E1149 Type 2 diabetes mellitus with other diabetic neurological complication: Secondary | ICD-10-CM

## 2023-03-19 DIAGNOSIS — I10 Essential (primary) hypertension: Secondary | ICD-10-CM

## 2023-03-19 MED ORDER — VALSARTAN 160 MG PO TABS: 160.0000 mg | ORAL_TABLET | Freq: Every day | ORAL | 1 refills | Status: DC

## 2023-03-19 MED ORDER — GLIPIZIDE ER 5 MG PO TB24: 5.0000 mg | ORAL_TABLET | Freq: Every day | ORAL | 1 refills | Status: DC

## 2023-03-19 NOTE — Telephone Encounter (Signed)
[facility] faxed refill request for the following medications:   valsartan (DIOVAN) 160 MG tablet    glipiZIDE (GLUCOTROL XL) 5 MG 24 hr tablet    Please advise.

## 2023-04-06 ENCOUNTER — Other Ambulatory Visit: Payer: Self-pay | Admitting: Family Medicine

## 2023-04-06 DIAGNOSIS — I10 Essential (primary) hypertension: Secondary | ICD-10-CM

## 2023-04-09 MED ORDER — FUROSEMIDE 20 MG PO TABS: 20.0000 mg | ORAL_TABLET | Freq: Every day | ORAL | 1 refills | Status: DC | PRN

## 2023-04-09 NOTE — Telephone Encounter (Signed)
Medication refill for Furosemide 20 mg  last ov 01/2023, upcoming ov no up coming visit . Please advise

## 2023-04-19 ENCOUNTER — Telehealth: Payer: Self-pay | Admitting: Family Medicine

## 2023-04-19 DIAGNOSIS — F03B18 Unspecified dementia, moderate, with other behavioral disturbance: Secondary | ICD-10-CM

## 2023-04-19 NOTE — Telephone Encounter (Signed)
[facility] called and spoke to [name] [name], [facility] about the refill(s) rivastigmine and empagliflozin requested. Advised they were sent on 02/10/23. She says both are showing available and the rivastigmine will be available tomorrow, the empagliflozin today.

## 2023-04-19 NOTE — Telephone Encounter (Signed)
Medication Refill - Medication: rivastigmine (EXELON) 6 MG capsule , empagliflozin (JARDIANCE) 25 MG TABS tablet   Has the patient contacted their pharmacy? Yes.     Preferred Pharmacy (with phone number or street name):  [id number] [place] [address] Phone: [phone]  Fax: [phone]      Has the patient been seen for an appointment in the last year OR does the patient have an upcoming appointment? Yes.    The patient never picked up the 6 mg of the rivastigmine because of the cost getting them at 90 days. She would like both of these medications called in with only a 30 day supply at this time. Please assist patient further

## 2023-04-20 ENCOUNTER — Other Ambulatory Visit: Payer: Self-pay | Admitting: Family Medicine

## 2023-04-20 DIAGNOSIS — E1149 Type 2 diabetes mellitus with other diabetic neurological complication: Secondary | ICD-10-CM

## 2023-04-20 MED ORDER — EMPAGLIFLOZIN 25 MG PO TABS
25.0000 mg | ORAL_TABLET | Freq: Every day | ORAL | 3 refills | Status: AC
Start: 2023-04-20 — End: ?

## 2023-04-25 ENCOUNTER — Other Ambulatory Visit: Payer: Self-pay | Admitting: Family Medicine

## 2023-04-25 DIAGNOSIS — E1149 Type 2 diabetes mellitus with other diabetic neurological complication: Secondary | ICD-10-CM

## 2023-04-27 NOTE — Telephone Encounter (Signed)
Request is too soon for refill. Last refill 03/19/23 for 90 and 1 refill.  Requested Prescriptions  Pending Prescriptions Disp Refills   glipiZIDE (GLUCOTROL XL) 5 MG 24 hr tablet [Pharmacy Med Name: GLIPIZIDE ER 5 MG TABLET] 90 tablet 1    Sig: TAKE 1 TABLET (5 MG TOTAL) BY MOUTH DAILY.     Endocrinology:  Diabetes - Sulfonylureas Failed - 04/25/2023 11:31 AM      Failed - HBA1C is between 0 and 7.9 and within 180 days    Hgb A1c MFr Bld  Date Value Ref Range Status  02/04/2023 9.0 (H) 4.8 - 5.6 % Final    Comment:             Prediabetes: 5.7 - 6.4          Diabetes: >6.4          Glycemic control for adults with diabetes: <7.0          Passed - Cr in normal range and within 360 days    Creat  Date Value Ref Range Status  07/06/2017 0.90 (H) 0.60 - 0.88 mg/dL Final    Comment:    For patients >90 years of age, the reference limit for Creatinine is approximately 13% higher for people identified as African-American. .    Creatinine, Ser  Date Value Ref Range Status  02/04/2023 0.87 0.57 - 1.00 mg/dL Final   Creatinine, POC  Date Value Ref Range Status  02/26/2017 n/a mg/dL Final         Passed - Valid encounter within last 6 months    Recent Outpatient Visits           2 months ago Moderate dementia with other behavioral disturbance, unspecified dementia type [facility])   [place] [facility] Malva Limes, MD   3 months ago Functional diarrhea   [place] [facility] Malva Limes, MD   7 months ago B12 deficiency   [facility] Malva Limes, MD   8 months ago Primary hypertension   [place] [facility] Malva Limes, MD   8 months ago Non-seasonal allergic rhinitis due to pollen   [facility] Sherrie Mustache, Demetrios Isaacs, MD       Future Appointments             In 1 week Fisher, Demetrios Isaacs, MD [facility]

## 2023-05-10 ENCOUNTER — Telehealth (INDEPENDENT_AMBULATORY_CARE_PROVIDER_SITE_OTHER): Payer: Medicare HMO | Admitting: Family Medicine

## 2023-05-10 ENCOUNTER — Ambulatory Visit: Payer: Medicare HMO | Admitting: Family Medicine

## 2023-05-10 VITALS — BP 175/79 | HR 73

## 2023-05-10 DIAGNOSIS — I10 Essential (primary) hypertension: Secondary | ICD-10-CM

## 2023-05-10 DIAGNOSIS — E1149 Type 2 diabetes mellitus with other diabetic neurological complication: Secondary | ICD-10-CM | POA: Diagnosis not present

## 2023-05-10 DIAGNOSIS — E039 Hypothyroidism, unspecified: Secondary | ICD-10-CM | POA: Diagnosis not present

## 2023-05-10 DIAGNOSIS — E785 Hyperlipidemia, unspecified: Secondary | ICD-10-CM

## 2023-05-10 DIAGNOSIS — R3589 Other polyuria: Secondary | ICD-10-CM

## 2023-05-10 DIAGNOSIS — F03B0 Unspecified dementia, moderate, without behavioral disturbance, psychotic disturbance, mood disturbance, and anxiety: Secondary | ICD-10-CM

## 2023-05-10 DIAGNOSIS — E538 Deficiency of other specified B group vitamins: Secondary | ICD-10-CM | POA: Diagnosis not present

## 2023-05-10 MED ORDER — DEXCOM G7 SENSOR MISC: 3 refills | Status: AC

## 2023-05-10 MED ORDER — DEXCOM G7 RECEIVER DEVI
3 refills | Status: AC
Start: 2023-05-10 — End: ?

## 2023-05-10 NOTE — Progress Notes (Addendum)
MyChart Video Visit    Virtual Visit via Video Note   This format is felt to be most appropriate for this patient at this time. Physical exam was limited by quality of the video and audio technology used for the visit.   Patient location: home Provider location: [facility]  I discussed the limitations of evaluation and management by telemedicine and the availability of in person appointments. The patient expressed understanding and agreed to proceed.      Discussed the use of AI scribe software for clinical note transcription with the patient, who gave verbal consent to proceed.  History of Present Illness   [name], a patient with a history of diabetes, hypertension, and dementia, presents for a follow-up visit. The primary concern is the difficulty in monitoring her blood sugar levels due to her progressing dementia. The patient's caregiver has expressed interest in obtaining a continuous glucose monitor to aid in this process.  Additionally, the patient has been experiencing an increase in urinary frequency despite being on oxybutynin. This medication had been effective for a long period, but its efficacy seems to have decreased in the past three months. The patient's caregiver has been trying to manage her fluid intake, particularly limiting her consumption of sodas, which the patient prefers over water. However, the caregiver is finding it increasingly challenging to control the patient's soda intake due to her dementia.  The patient's mobility has also been declining, with the caregiver noting that she is not walking as well as she used to. This has led to difficulties in bringing the patient to the clinic for her appointments. The caregiver has been managing the patient's medication renewals through the pharmacy, and there have been no reported issues with this process.     Lab Results  Component Value Date   HGBA1C 9.0 (H) 02/04/2023   HGBA1C 7.3 (H) 08/19/2022   HGBA1C 6.5 (A)  05/05/2021   Lab Results  Component Value Date   NA 143 02/04/2023   K 4.0 02/04/2023   CREATININE 0.87 02/04/2023   EGFR 64 02/04/2023   GLUCOSE 232 (H) 02/04/2023   Lab Results  Component Value Date   CHOL 195 08/19/2022   HDL 39 (L) 08/19/2022   LDLCALC 133 (H) 08/19/2022   TRIG 126 08/19/2022   CHOLHDL 5.0 (H) 08/19/2022   Lab Results  Component Value Date   TSH 0.281 (L) 02/04/2023     Medications: Outpatient Medications Prior to Visit  Medication Sig   Accu-Chek Softclix Lancets lancets USE TO CHECK BLOOD SUGAR ONCE A DAY   Alcohol Swabs (DROPSAFE ALCOHOL PREP) 70 % PADS USE TO TEST BLOOD SUGAR EVERY DAY   amLODipine (NORVASC) 5 MG tablet TAKE 1 TABLET EVERY DAY   Blood Glucose Monitoring Suppl (ACCU-CHEK AVIVA PLUS) w/Device KIT USE TO CHECK BLOOD SUGAR ONCE A DAY   DULoxetine (CYMBALTA) 60 MG capsule Take 1 capsule (60 mg total) by mouth daily.   empagliflozin (JARDIANCE) 25 MG TABS tablet Take 1 tablet (25 mg total) by mouth daily before breakfast.   ezetimibe (ZETIA) 10 MG tablet TAKE 1 TABLET EVERY DAY   fluticasone (FLONASE) 50 MCG/ACT nasal spray PLACE 2 SPRAYS INTO BOTH NOSTRILS DAILY.   furosemide (LASIX) 20 MG tablet Take 1 tablet (20 mg total) by mouth daily as needed.   gabapentin (NEURONTIN) 100 MG capsule TAKE 1 CAPSULE EVERY MORNING   gabapentin (NEURONTIN) 300 MG capsule TAKE 1 CAPSULE AT BEDTIME (IN ADDITION TO THE 100MG  CAPSULE IN THE  MORNING)   glipiZIDE (GLUCOTROL XL) 5 MG 24 hr tablet Take 1 tablet (5 mg total) by mouth daily.   glucose blood (ACCU-CHEK AVIVA PLUS) test strip TEST BLOOD SUGAR ONE TIME DAILY   levothyroxine (SYNTHROID) 75 MCG tablet TAKE 1 TABLET BY MOUTH EVERY DAY   Multiple Vitamins-Minerals (DAILY MULTIVITAMIN PO) Take 1 tablet by mouth.   oxybutynin (DITROPAN) 5 MG tablet TAKE 1 TABLET 2 TO 3 TIMES DAILY AS NEEDED FOR URINARY FREQUENCY   rivastigmine (EXELON) 6 MG capsule Take 1 capsule (6 mg total) by mouth 2 (two) times  daily.   tiZANidine (ZANAFLEX) 2 MG tablet Take 2 tablets (4 mg total) by mouth every 8 (eight) hours as needed for muscle spasms.   valsartan (DIOVAN) 160 MG tablet Take 1 tablet (160 mg total) by mouth daily.   [DISCONTINUED] ALPRAZolam (XANAX) 0.25 MG tablet Take 1 tablet (0.25 mg total) by mouth every 6 (six) hours as needed for anxiety. (Patient not taking: Reported on 01/06/2023)   No facility-administered medications prior to visit.        Objective    BP (!) 175/79   Pulse 73   SpO2 96%   Physical Exam  Awake, alert, oriented x 3. In no apparent distress   Assessment & Plan        Diabetes Mellitus Difficulty in monitoring blood glucose due to dementia. Discussed the possibility of a continuous glucose monitor. -Submit prescription for continuous glucose monitor to [facility].  Urinary Frequency Increased urination despite Oxybutynin use. Possible influence of high sugar intake. -Continue Oxybutynin. -Encourage reduction in soda intake.  Hypertension, hypothyroid, hyperlipidemia, B12 deficiency lab orders placed, her daughter plans on bringing her into labs sometime within the next week to get blood drawn.    Dementia- - Continue rivastigmine   -Follow-up in -office in November or December.    No follow-ups on file.    I discussed the assessment and treatment plan with the patient. The patient was provided an opportunity to ask questions and all were answered. The patient agreed with the plan and demonstrated an understanding of the instructions.   The patient was advised to call back or seek an in-person evaluation if the symptoms worsen or if the condition fails to improve as anticipated.  I provided 10 minutes of non-face-to-face time during this encounter.     Mila Merry, MD  [facility] [phone] (phone) [phone] (fax)  [facility]

## 2023-05-11 DIAGNOSIS — Z0279 Encounter for issue of other medical certificate: Secondary | ICD-10-CM

## 2023-05-12 DIAGNOSIS — Z0279 Encounter for issue of other medical certificate: Secondary | ICD-10-CM

## 2023-05-19 ENCOUNTER — Telehealth: Payer: Self-pay | Admitting: Family Medicine

## 2023-05-19 NOTE — Telephone Encounter (Signed)
[facility] is requesting prior authorization Key: [id number] Name: [facility] G7 Sensor

## 2023-05-19 NOTE — Patient Instructions (Signed)
.   Please review the attached list of medications and notify my office if there are any errors.   . Please bring all of your medications to every appointment so we can make sure that our medication list is the same as yours.   

## 2023-05-21 NOTE — Telephone Encounter (Signed)
PA initiated

## 2023-05-24 NOTE — Telephone Encounter (Addendum)
FYI Outcome Approved on September 7 by [facility] NCPDP 2017 PA Case: [id number], Status: Approved, Coverage Starts on: 09/14/2022 12:00:00 AM, Coverage Ends on: 09/14/2023 12:00:00 AM. Questions? Contact [phone]. Authorization Expiration Date: 09/13/2023 Drug Dexcom G7 Sensor  Called [facility] pharmacy to let know. Per Barnabas Harries even with the prior authorization  The Dexcom G7 Sensor for 3/30 days is $70.45 out of pocket and the Receiver 1 for 90 is $57

## 2023-05-29 NOTE — Addendum Note (Signed)
Addended by: Malva Limes on: 05/29/2023 09:29 AM   Modules accepted: Level of Service

## 2023-05-30 ENCOUNTER — Other Ambulatory Visit: Payer: Self-pay | Admitting: Family Medicine

## 2023-06-01 ENCOUNTER — Encounter: Payer: Self-pay | Admitting: Family Medicine

## 2023-06-23 ENCOUNTER — Ambulatory Visit (INDEPENDENT_AMBULATORY_CARE_PROVIDER_SITE_OTHER): Payer: Medicare HMO

## 2023-06-23 VITALS — Ht 65.0 in | Wt 179.0 lb

## 2023-06-23 DIAGNOSIS — Z0000 Encounter for general adult medical examination without abnormal findings: Secondary | ICD-10-CM | POA: Diagnosis not present

## 2023-06-23 DIAGNOSIS — G8929 Other chronic pain: Secondary | ICD-10-CM

## 2023-06-23 NOTE — Progress Notes (Signed)
Subjective:   [name] [name] is a 89 y.o. female who presents for Medicare Annual (Subsequent) preventive examination.  Visit Complete: Virtual I connected with  [name] [name] on 06/23/23 by a audio enabled telemedicine application and verified that I am speaking with the correct person using two identifiers.  Patient Location: Home  Provider Location: Home Office  I discussed the limitations of evaluation and management by telemedicine. The patient expressed understanding and agreed to proceed.  Vital Signs: Because this visit was a virtual/telehealth visit, some criteria may be missing or patient reported. Any vitals not documented were not able to be obtained and vitals that have been documented are patient reported.  Patient Medicare AWV questionnaire was completed by the patient on 06/22/23; I have confirmed that all information answered by patient is correct and no changes since this date.  Cardiac Risk Factors include: advanced age (>71men, >55 women);dyslipidemia;hypertension;sedentary lifestyle     Objective:    Today's Vitals   06/23/23 1440  Weight: 179 lb (81.2 kg)  Height: 5\' 5"  (1.651 m)   Body mass index is 29.79 kg/m.     06/23/2023    2:56 PM 09/02/2022    9:07 AM 08/29/2022    2:05 PM 09/01/2021    8:36 AM 08/26/2020    8:38 AM 11/08/2017   10:42 AM 07/06/2017    9:58 AM  Advanced Directives  Does Patient Have a Medical Advance Directive? No No No No Yes No No  Type of Agricultural consultant;Living will    Copy of Healthcare Power of Attorney in Chart?     No - copy requested    Would patient like information on creating a medical advance directive?  No - Patient declined No - Patient declined No - Patient declined  Yes (MAU/Ambulatory/Procedural Areas - Information given)     Current Medications (verified) Outpatient Encounter Medications as of 06/23/2023  Medication Sig   Accu-Chek Softclix Lancets lancets USE TO  CHECK BLOOD SUGAR ONCE A DAY   Alcohol Swabs (DROPSAFE ALCOHOL PREP) 70 % PADS USE AS DIRECTED EVERY DAY   amLODipine (NORVASC) 5 MG tablet TAKE 1 TABLET EVERY DAY   Blood Glucose Monitoring Suppl (ACCU-CHEK AVIVA PLUS) w/Device KIT USE TO CHECK BLOOD SUGAR ONCE A DAY   Continuous Glucose Receiver (DEXCOM G7 RECEIVER) DEVI Use to check blood sugar as needed for insulin dependent type 2 diabetes   Continuous Glucose Sensor (DEXCOM G7 SENSOR) MISC Use to check blood sugar as needed for insulin dependent type 2 diabetes   DULoxetine (CYMBALTA) 60 MG capsule Take 1 capsule (60 mg total) by mouth daily.   empagliflozin (JARDIANCE) 25 MG TABS tablet Take 1 tablet (25 mg total) by mouth daily before breakfast.   ezetimibe (ZETIA) 10 MG tablet TAKE 1 TABLET EVERY DAY   fluticasone (FLONASE) 50 MCG/ACT nasal spray PLACE 2 SPRAYS INTO BOTH NOSTRILS DAILY.   furosemide (LASIX) 20 MG tablet Take 1 tablet (20 mg total) by mouth daily as needed.   gabapentin (NEURONTIN) 100 MG capsule TAKE 1 CAPSULE EVERY MORNING   gabapentin (NEURONTIN) 300 MG capsule TAKE 1 CAPSULE AT BEDTIME (IN ADDITION TO THE 100MG  CAPSULE IN THE MORNING)   glipiZIDE (GLUCOTROL XL) 5 MG 24 hr tablet Take 1 tablet (5 mg total) by mouth daily.   glucose blood (ACCU-CHEK AVIVA PLUS) test strip TEST BLOOD SUGAR ONE TIME DAILY   levothyroxine (SYNTHROID) 75 MCG tablet TAKE 1 TABLET BY MOUTH  EVERY DAY   Multiple Vitamins-Minerals (DAILY MULTIVITAMIN PO) Take 1 tablet by mouth.   oxybutynin (DITROPAN) 5 MG tablet TAKE 1 TABLET 2 TO 3 TIMES DAILY AS NEEDED FOR URINARY FREQUENCY   rivastigmine (EXELON) 6 MG capsule Take 1 capsule (6 mg total) by mouth 2 (two) times daily.   tiZANidine (ZANAFLEX) 2 MG tablet Take 2 tablets (4 mg total) by mouth every 8 (eight) hours as needed for muscle spasms.   valsartan (DIOVAN) 160 MG tablet Take 1 tablet (160 mg total) by mouth daily.   No facility-administered encounter medications on file as of 06/23/2023.     Allergies (verified) Aricept [donepezil], Lovastatin, Pravastatin sodium, and Simvastatin   History: Past Medical History:  Diagnosis Date   Allergy    Arthritis    Cancer (HCC)    thryoid   Cataract    Depression 02/2022   Diabetes mellitus without complication (HCC)    GERD (gastroesophageal reflux disease)    Hyperlipidemia    Hypertension    Thyroid disease    Past Surgical History:  Procedure Laterality Date   ABDOMINAL HYSTERECTOMY     BREAST BIOPSY Right ?   benign   CATARACT EXTRACTION     EYE SURGERY     THYROIDECTOMY, PARTIAL  1994   TONSILLECTOMY     Family History  Problem Relation Age of Onset   Cancer Daughter        breast   Asthma Daughter    Diabetes Son    ADD / ADHD Son    Alcohol abuse Son    Drug abuse Son    Social History   Socioeconomic History   Marital status: Married    Spouse name: Not on file   Number of children: 3   Years of education: Not on file   Highest education level: 10th grade  Occupational History   Occupation: Retired   Tobacco Use   Smoking status: Never   Smokeless tobacco: Never  Vaping Use   Vaping status: Never Used  Substance and Sexual Activity   Alcohol use: No   Drug use: No   Sexual activity: Not Currently  Other Topics Concern   Not on file  Social History Narrative   Not on file   Social Determinants of Health   Financial Resource Strain: High Risk (06/22/2023)   Overall Financial Resource Strain (CARDIA)    Difficulty of Paying Living Expenses: Hard  Food Insecurity: Food Insecurity Present (06/22/2023)   Hunger Vital Sign    Worried About Running Out of Food in the Last Year: Sometimes true    Ran Out of Food in the Last Year: Sometimes true  Transportation Needs: No Transportation Needs (06/22/2023)   PRAPARE - Administrator, Civil Service (Medical): No    Lack of Transportation (Non-Medical): No  Physical Activity: Inactive (06/22/2023)   Exercise Vital Sign    Days of  Exercise per Week: 0 days    Minutes of Exercise per Session: 0 min  Stress: Stress Concern Present (06/22/2023)   [employer] of Occupational Health - Occupational Stress Questionnaire    Feeling of Stress : Rather much  Social Connections: Moderately Isolated (06/22/2023)   Social Connection and Isolation Panel [NHANES]    Frequency of Communication with Friends and Family: Once a week    Frequency of Social Gatherings with Friends and Family: Never    Attends Religious Services: Never    Database administrator or Organizations: Yes  Attends Banker Meetings: Never    Marital Status: Married    Tobacco Counseling Counseling given: Not Answered   Clinical Intake:  Pre-visit preparation completed: Yes  Pain : No/denies pain     BMI - recorded: 29.79 Nutritional Status: BMI 25 -29 Overweight Diabetes: Yes CBG done?: Yes (BS 123 this am at home) CBG resulted in Enter/ Edit results?: No Did pt. bring in CBG monitor from home?: No  How often do you need to have someone help you when you read instructions, pamphlets, or other written materials from your doctor or pharmacy?: 5 - Always  Interpreter Needed?: No  Comments: lives with daughter Information entered by :: B.Kristoffer Bala,LPN   Activities of Daily Living    06/22/2023    4:54 PM 02/05/2023    1:29 PM  In your present state of health, do you have any difficulty performing the following activities:  Hearing? 0 0  Vision? 0 0  Difficulty concentrating or making decisions? 1 1  Walking or climbing stairs? 1 1  Dressing or bathing? 1 1  Doing errands, shopping? 1 1  Preparing Food and eating ? Y   Using the Toilet? N   In the past six months, have you accidently leaked urine? Y   Do you have problems with loss of bowel control? Y   Managing your Medications? Y   Managing your Finances? Y   Housekeeping or managing your Housekeeping? Y     Patient Care Team: Malva Limes, MD as PCP -  General (Family Medicine) Merri Ray, MD as Referring Physician (Physical Medicine and Rehabilitation) Wynell Balloon, MD as Consulting Physician (Ophthalmology)  Indicate any recent Medical Services you may have received from other than [facility] providers in the past year (date may be approximate).     Assessment:   This is a routine wellness examination for [name].  Hearing/Vision screen Hearing Screening - Comments:: Pt says hears good Vision Screening - Comments:: Pt says her vision is alright:she watches TV and reads [facility] [place]   Goals Addressed   None    Depression Screen    06/23/2023    2:52 PM 02/05/2023    1:29 PM 09/02/2022    9:05 AM 09/01/2021    8:32 AM 05/05/2021   11:35 AM 12/03/2020   10:04 AM 11/05/2020   11:04 AM  PHQ 2/9 Scores  PHQ - 2 Score 2 6 2 2 2 2  0  PHQ- 9 Score 8 16 4 3 5 5 2     Fall Risk    06/22/2023    4:54 PM 02/05/2023    1:29 PM 09/02/2022    9:08 AM 09/01/2021    8:38 AM 05/23/2021    2:02 PM  Fall Risk   Falls in the past year? 1 1 1 1 1   Number falls in past yr: 1 1 1 1 1   Injury with Fall? 1 1 1  0 0  Risk for fall due to : No Fall Risks History of fall(s) Impaired balance/gait History of fall(s);Mental status change History of fall(s)  Follow up Education provided;Falls prevention discussed Falls evaluation completed Falls evaluation completed;Falls prevention discussed Falls prevention discussed Falls prevention discussed    MEDICARE RISK AT HOME: Medicare Risk at Home Any stairs in or around the home?: Yes If so, are there any without handrails?: No Home free of loose throw rugs in walkways, pet beds, electrical cords, etc?: Yes Adequate lighting in your home to reduce risk of falls?: Yes Life  alert?: No Use of a cane, walker or w/c?: Yes Grab bars in the bathroom?: Yes Shower chair or bench in shower?: Yes Elevated toilet seat or a handicapped toilet?: No  TIMED UP AND GO:  Was the test  performed?  No    Cognitive Function:    06/23/2023    3:12 PM 09/21/2022    2:54 PM  MMSE - Mini Mental State Exam  Not completed: Unable to complete   Orientation to time  3  Orientation to Place  4  Registration  3  Attention/ Calculation  0  Recall  0  Language- name 2 objects  2  Language- repeat  0  Language- follow 3 step command  3  Language- read & follow direction  1  Write a sentence  0  Copy design  1  Total score  17        08/25/2016   10:16 AM  6CIT Screen  What Year? 0 points  What month? 0 points  What time? 0 points  Count back from 20 0 points  Months in reverse 0 points  Repeat phrase 4 points  Total Score 4 points    Immunizations Immunization History  Administered Date(s) Administered   Fluad Quad(high Dose 65+) 06/14/2019, 06/21/2020, 08/19/2022   Influenza, High Dose Seasonal PF 05/27/2015, 06/24/2016, 07/15/2017, 07/30/2018   PFIZER Comirnaty(Gray Top)Covid-19 Tri-Sucrose Vaccine 08/19/2022   PFIZER(Purple Top)SARS-COV-2 Vaccination 10/28/2019, 11/21/2019, 07/11/2020   Pneumococcal Conjugate-13 08/07/2014   Pneumococcal Polysaccharide-23 12/31/2000   Td 02/19/1998   Tdap 06/20/2011, 08/29/2022   Zoster, Live 12/13/2007    TDAP status: Up to date  Flu Vaccine status: Due, Education has been provided regarding the importance of this vaccine. Advised may receive this vaccine at local pharmacy or Health Dept. Aware to provide a copy of the vaccination record if obtained from local pharmacy or Health Dept. Verbalized acceptance and understanding.  Pneumococcal vaccine status: Up to date  Covid-19 vaccine status: Completed vaccines  Qualifies for Shingles Vaccine? Yes   Zostavax completed No   Shingrix Completed?: No.    Education has been provided regarding the importance of this vaccine. Patient has been advised to call insurance company to determine out of pocket expense if they have not yet received this vaccine. Advised may also receive  vaccine at local pharmacy or Health Dept. Verbalized acceptance and understanding.  Screening Tests Health Maintenance  Topic Date Due   Zoster Vaccines- Shingrix (1 of 2) 10/25/1951   MAMMOGRAM  08/30/2019   DEXA SCAN  04/21/2020   OPHTHALMOLOGY EXAM  04/01/2021   COVID-19 Vaccine (5 - 2023-24 season) 05/16/2023   INFLUENZA VACCINE  12/13/2023 (Originally 04/15/2023)   HEMOGLOBIN A1C  08/07/2023   Medicare Annual Wellness (AWV)  06/22/2024   DTaP/Tdap/Td (4 - Td or Tdap) 08/29/2032   Pneumonia Vaccine 42+ Years old  Completed   HPV VACCINES  Aged Out    Health Maintenance  Health Maintenance Due  Topic Date Due   Zoster Vaccines- Shingrix (1 of 2) 10/25/1951   MAMMOGRAM  08/30/2019   DEXA SCAN  04/21/2020   OPHTHALMOLOGY EXAM  04/01/2021   COVID-19 Vaccine (5 - 2023-24 season) 05/16/2023    Colorectal cancer screening: No longer required.   Mammogram status: No longer required due to age.  Bone Density status: Completed 04/21/20. Results reflect: Bone density results: NORMAL. Repeat every 5 years.  Lung Cancer Screening: (Low Dose CT Chest recommended if Age 43-80 years, 20 pack-year currently smoking OR have quit w/in  15years.) does not qualify.   Lung Cancer Screening Referral: no  Additional Screening:  Hepatitis C Screening: does not qualify; Completed no  Vision Screening: Recommended annual ophthalmology exams for early detection of glaucoma and other disorders of the eye. Is the patient up to date with their annual eye exam?  Yes  Who is the provider or what is the name of the office in which the patient attends annual eye exams? [facility] If pt is not established with a provider, would they like to be referred to a provider to establish care? No .   Dental Screening: Recommended annual dental exams for proper oral hygiene  Diabetic Foot Exam: Diabetic Foot Exam: Overdue, Pt has been advised about the importance in completing this exam. Pt  is scheduled for diabetic foot exam on next PCP appt.  Community Resource Referral / Chronic Care Management: CRR required this visit?  Yes   CCM required this visit?  No PT REFUSES    Plan:     I have personally reviewed and noted the following in the patient's chart:   Medical and social history Use of alcohol, tobacco or illicit drugs  Current medications and supplements including opioid prescriptions. Patient is not currently taking opioid prescriptions. Functional ability and status Nutritional status Physical activity Advanced directives List of other physicians Hospitalizations, surgeries, and ER visits in previous 12 months Vitals Screenings to include cognitive, depression, and falls Referrals and appointments  In addition, I have reviewed and discussed with patient certain preventive protocols, quality metrics, and best practice recommendations. A written personalized care plan for preventive services as well as general preventive health recommendations were provided to patient.    Sue Lush, LPN   16/09/958   After Visit Summary: (MyChart) Due to this being a telephonic visit, the after visit summary with patients personalized plan was offered to patient via MyChart   Nurse Notes: Pt daughter Rosey Bath [name]/POA) who says pt is reportedly (by [facility]) that pt is having some vaginal bleeding. Daughter says she is unable to bring to appts as pts refuses.She relays she becomes combative when trying to get her to go or do what she does not want to do/go.Pt daughter relays a lot of caregiver stress and need for help caring and managing pt care.

## 2023-06-23 NOTE — Patient Instructions (Addendum)
Ms. Lavalle , Thank you for taking time to come for your Medicare Wellness Visit. I appreciate your ongoing commitment to your health goals. Please review the following plan we discussed and let me know if I can assist you in the future.   Referrals/Orders/Follow-Ups/Clinician Recommendations:   A referral was placed for you today for community resources and care management. This will check to see what community resources are available for you for anything we discussed during your wellness visit today that you may be having difficulty with such a obtaining food, paying for utilities, or transportation needs. Someone will contact you in a few days. If you haven't heard from someone within the next month, please call the number below  [facility] Concierge Line  [phone]   This is a list of the screening recommended for you and due dates:  Health Maintenance  Topic Date Due   Zoster (Shingles) Vaccine (1 of 2) 10/25/1951   Mammogram  08/30/2019   DEXA scan (bone density measurement)  04/21/2020   Eye exam for diabetics  04/01/2021   COVID-19 Vaccine (5 - 2023-24 season) 05/16/2023   Flu Shot  12/13/2023*   Hemoglobin A1C  08/07/2023   Medicare Annual Wellness Visit  06/22/2024   DTaP/Tdap/Td vaccine (4 - Td or Tdap) 08/29/2032   Pneumonia Vaccine  Completed   HPV Vaccine  Aged Out  *Topic was postponed. The date shown is not the original due date.    Advanced directives: (ACP Link)Information on Advanced Care Planning can be found at [facility] of [facility] Directives Advance Health Care Directives ([email or web address]   Next Medicare Annual Wellness Visit scheduled for next year: Yes 06/28/24 @1 :40pm telephone

## 2023-06-24 ENCOUNTER — Telehealth: Payer: Self-pay | Admitting: *Deleted

## 2023-06-24 NOTE — Progress Notes (Signed)
  Care Coordination   Note   06/24/2023 Name: [name] [name] MRN: [id number] DOB: 1933/02/22  [name] [name] is a 89 y.o. year old female who sees Fisher, Demetrios Isaacs, MD for primary care. I reached out to [name] [name] by phone today to offer care coordination services.  [name] was given information about Care Coordination services today including:   The Care Coordination services include support from the care team which includes your Nurse Coordinator, Clinical Social Worker, or Pharmacist.  The Care Coordination team is here to help remove barriers to the health concerns and goals most important to you. Care Coordination services are voluntary, and the patient may decline or stop services at any time by request to their care team member.   Care Coordination Consent Status: Patient agreed to services and verbal consent obtained.   Follow up plan:  Telephone appointment with care coordination team member scheduled for:  06/25/2023 and 06/28/2023  Encounter Outcome:  Patient Scheduled from referral   Burman Nieves, [facility] Care Coordination Care Guide Direct Dial: [phone]

## 2023-06-25 ENCOUNTER — Encounter: Payer: Self-pay | Admitting: *Deleted

## 2023-06-25 ENCOUNTER — Ambulatory Visit: Payer: Self-pay | Admitting: Licensed Clinical Social Worker

## 2023-06-25 NOTE — Patient Outreach (Signed)
  Care Coordination   Initial Visit Note   06/25/2023 Name: [name] [name] MRN: [id number] DOB: 08-02-1933  [name] [name] is a 89 y.o. year old female who sees Fisher, Demetrios Isaacs, MD for primary care. I spoke with  [name] [name] by phone today.  What matters to the patients health and wellness today?  LCSW A. Felton Clinton spk with caregiver Michaell Cowing for emotional and caregiver support. [name] [name] requested assistance with in home medical care due to difficulty getting [name] [name] to in-office medical appointments. LCSW A Yuan Gann provided [name] [name] with contact information to [facility] to inquire and/or begin at home medical visits.    Goals Addressed             This Visit's Progress    Care Coordination       Activities and task to complete in order to accomplish goals.   Self Support options  (Practice caregiver self care techniques such prioritizing rest, consider respite options  ) LCSW A Felton Clinton provided information to [facility] to consider in home medical appts. [name] [name] reports difficulty getting Ms. [name] [name] to in-office medical appts.    LCSW A Harlon Ditty will research additional options to assist [name] [name] with caregiver support.         SDOH assessments and interventions completed:  Yes  SDOH Interventions Today    Flowsheet Row Most Recent Value  SDOH Interventions   Food Insecurity Interventions Intervention Not Indicated  Housing Interventions Intervention Not Indicated  Transportation Interventions Intervention Not Indicated        Care Coordination Interventions:  Yes, provided   Interventions Today    Flowsheet Row Most Recent Value  Chronic Disease   Chronic disease during today's visit Other  [[name] [name] reports mother [name] [name] has dementia.]  General Interventions   General Interventions Discussed/Reviewed General Interventions Discussed, Doctor Visits  Doctor Visits Discussed/Reviewed Doctor Visits Discussed  [[name] [name]  reports difficulty with getting [name] [name] to in-office medical appts due to dementia. LCSW A. Felton Clinton provided [name] [name] with contact information to [facility].]  Safety Interventions   Safety Discussed/Reviewed Fall Risk  [[name] [name] reports [name] [name] is ambulatory and utilizes a walker. [name] [name] reports [name] [name] falls out the bed because she prefers to sleep on the edge of the bed. LCSW A. Felton Clinton urged [name] [name] to consider using bedrails for safety.]       Follow up plan: Follow up call scheduled for 10 /29/2024   Encounter Outcome:  Patient Visit Completed

## 2023-07-01 ENCOUNTER — Ambulatory Visit: Payer: Self-pay | Admitting: *Deleted

## 2023-07-01 NOTE — Patient Outreach (Signed)
  Care Coordination   Follow Up Visit Note   07/01/2023 Name: [name] [name] MRN: [id number] DOB: 01-24-1933  [name] [name] is a 89 y.o. year old female who sees Fisher, Demetrios Isaacs, MD for primary care. I spoke with Rosey Bath, daughter of [name] [name] by phone today.  What matters to the patients health and wellness today?  Patient lives with daughter, who has been having a hard time getting patient to be compliant with MD appointments.  She is needing more resources in the home, SW referred patient to [facility].  Per Ocie Bob, they are working to assign patient's case.     Goals Addressed             This Visit's Progress    Effective management of chronic conditions       Interventions Today    Flowsheet Row Most Recent Value  Chronic Disease   Chronic disease during today's visit Other, Diabetes, Hypertension (HTN)  [Dementia]  General Interventions   General Interventions Discussed/Reviewed General Interventions Reviewed, [facility], Doctor Visits, Communication with  Doctor Visits Discussed/Reviewed Doctor Visits Discussed, PCP  Communication with --  [facility] to follow up on referral, currently being assigned]  Education Interventions   Education Provided Provided Education  Provided Verbal Education On Nutrition, Blood Sugar Monitoring, Medication, When to see the doctor, [employer] out of pocket for CNA to come for weekly baths]  Nutrition Interventions   Nutrition Discussed/Reviewed Nutrition Reviewed, Increasing proteins, Decreasing sugar intake, Decreasing salt              SDOH assessments and interventions completed:  No     Care Coordination Interventions:  Yes, provided   Follow up plan: Follow up call scheduled for 10/31    Encounter Outcome:  Patient Visit Completed   Kemper Durie RN, MSN, CCM [place]  [facility], [facility] RN Care Coordinator Direct Dial: [phone]  / Main [phone] Fax [phone] Email: [email or web address] Website: [email or web address]

## 2023-07-12 DIAGNOSIS — F0390 Unspecified dementia without behavioral disturbance: Secondary | ICD-10-CM | POA: Diagnosis not present

## 2023-07-12 DIAGNOSIS — I10 Essential (primary) hypertension: Secondary | ICD-10-CM | POA: Diagnosis not present

## 2023-07-12 DIAGNOSIS — N939 Abnormal uterine and vaginal bleeding, unspecified: Secondary | ICD-10-CM | POA: Diagnosis not present

## 2023-07-12 DIAGNOSIS — E1165 Type 2 diabetes mellitus with hyperglycemia: Secondary | ICD-10-CM | POA: Diagnosis not present

## 2023-07-12 DIAGNOSIS — E1142 Type 2 diabetes mellitus with diabetic polyneuropathy: Secondary | ICD-10-CM | POA: Diagnosis not present

## 2023-07-12 DIAGNOSIS — Z23 Encounter for immunization: Secondary | ICD-10-CM | POA: Diagnosis not present

## 2023-07-13 ENCOUNTER — Ambulatory Visit: Payer: Self-pay | Admitting: Licensed Clinical Social Worker

## 2023-07-14 NOTE — Patient Outreach (Signed)
  Care Coordination   Follow Up Visit Note   07/14/2023 Name: [name] [name] MRN: [id number] DOB: 1932-10-26  [name] [name] is a 89 y.o. year old female who sees Fisher, Demetrios Isaacs, MD for primary care. I spoke with  [name] [name] by phone today.  What matters to the patients health and wellness today?  LCSW A Leeanne Butters spoke with [name] [name] daughter [name] [name]. [name] [name] reports [facility] has started in home medical care approximately 07/01/2023. [name] [name] reports she does not have any further questions or immediate needs.     Goals Addressed             This Visit's Progress    COMPLETED: Care Coordination       Activities and task to complete in order to accomplish goals.   Self Support options  (Practice caregiver self care techniques such prioritizing rest, consider respite options  ) LCSW A Felton Clinton provided information to [facility] to consider in home medical appts. [name] [name] reports difficulty getting [name] [name] to in-office medical appts.    LCSW A Harlon Ditty will research additional options to assist [name] [name] with caregiver support.         SDOH assessments and interventions completed:  Yes     Care Coordination Interventions:  Yes, provided   Follow up plan: No further intervention required.   Encounter Outcome:  Patient Visit Completed   [name] [name] MSW, LCSW Licensed Clinical Social Worker  [facility], Population Health Direct Dial: [phone]  Fax: [phone]

## 2023-07-15 ENCOUNTER — Ambulatory Visit: Payer: Self-pay | Admitting: *Deleted

## 2023-07-15 ENCOUNTER — Other Ambulatory Visit: Payer: Self-pay | Admitting: Family Medicine

## 2023-07-15 NOTE — Patient Outreach (Signed)
  Care Coordination   Follow Up Visit Note   07/15/2023 Name: [place] M Malczewski MRN: [id number] DOB: 1933/05/02  Cy[place] M Palm is a 89 y.o. year old female who sees Fisher, Demetrios Isaacs, MD for primary care. I spoke with Rosey Bath, daughter of Cyprus M Saville by phone today.  What matters to the patients health and wellness today?  Daughter confirms [facility] has started in the home, will revisit in 2 weeks to complete initial case workup.  She received covid and flu vaccinations.  She is asking for virtual options for caregiver support groups as the ones she was provided are in person.  Denies any urgent concerns, encouraged to contact this care manager with questions.      Goals Addressed             This Visit's Progress    Effective management of chronic conditions   On track      Interventions Today    Flowsheet Row Most Recent Value  Chronic Disease   Chronic disease during today's visit Diabetes, Hypertension (HTN), Other  [Dementia]  General Interventions   General Interventions Discussed/Reviewed General Interventions Reviewed, [facility], Communication with  Doctor Visits Discussed/Reviewed Doctor Visits Reviewed, PCP  [facility] has not started with patient's primary care]  PCP/Specialist Visits Compliance with follow-up visit  Communication with Social Work  Radio broadcast assistant with CSW for caregiver resources]  Education Interventions   Education Provided Provided Education  Provided Verbal Education On [facility], When to see the doctor              SDOH assessments and interventions completed:  No     Care Coordination Interventions:  Yes, provided   Follow up plan: Follow up call scheduled for 11/26    Encounter Outcome:  Patient Visit Completed   Kemper Durie RN, MSN, CCM [place]  [facility], [facility] RN Care Coordinator Direct Dial: [phone] / Main [phone] Fax [phone] Email:  [email or web address] Website: [email or web address]

## 2023-07-15 NOTE — Telephone Encounter (Signed)
Requested Prescriptions  Pending Prescriptions Disp Refills   amLODipine (NORVASC) 5 MG tablet [Pharmacy Med Name: amLODIPine Besylate Oral Tablet 5 MG] 90 tablet 3    Sig: TAKE 1 TABLET EVERY DAY     Cardiovascular: Calcium Channel Blockers 2 Failed - 07/15/2023  2:09 AM      Failed - Last BP in normal range    BP Readings from Last 1 Encounters:  05/10/23 (!) 175/79         Passed - Last Heart Rate in normal range    Pulse Readings from Last 1 Encounters:  05/10/23 73         Passed - Valid encounter within last 6 months    Recent Outpatient Visits           2 months ago Diabetes mellitus type 2 with neurological manifestations [facility])   [place] [facility]va Limes, [facility]   5 months ago Moderate dementia with other behavioral disturbance, unspecified dementia type [facility])   [place] [facility] Malva Limes, MD   6 months ago Functional diarrhea   [facility] L[facility], [facility]   9 months ago B12 deficiency   [facility]va L[facility], MD   11 months ago Primary hypertension   [facility] Malva Limes, MD

## 2023-07-26 DIAGNOSIS — I10 Essential (primary) hypertension: Secondary | ICD-10-CM | POA: Diagnosis not present

## 2023-07-26 DIAGNOSIS — E785 Hyperlipidemia, unspecified: Secondary | ICD-10-CM | POA: Diagnosis not present

## 2023-07-26 DIAGNOSIS — E079 Disorder of thyroid, unspecified: Secondary | ICD-10-CM | POA: Diagnosis not present

## 2023-07-26 DIAGNOSIS — E119 Type 2 diabetes mellitus without complications: Secondary | ICD-10-CM | POA: Diagnosis not present

## 2023-07-26 DIAGNOSIS — F0390 Unspecified dementia without behavioral disturbance: Secondary | ICD-10-CM | POA: Diagnosis not present

## 2023-07-26 DIAGNOSIS — E118 Type 2 diabetes mellitus with unspecified complications: Secondary | ICD-10-CM | POA: Diagnosis not present

## 2023-08-10 ENCOUNTER — Ambulatory Visit: Payer: Self-pay | Admitting: *Deleted

## 2023-08-10 NOTE — Patient Outreach (Signed)
  Care Coordination   Follow Up Visit Note   08/10/2023 Name: [name] Adkins MRN: [id number] DOB: 1933-07-06  [name] [name] is a 89 y.o. year old female who sees Fisher, Demetrios Isaacs, MD for primary care. I spoke with Rosey Bath, daughter of [name] Defina by phone today.  What matters to the patients health and wellness today?  Working to continue adequate care for patient.      Goals Addressed             This Visit's Progress    Effective management of chronic conditions   On track    Interventions Today    Flowsheet Row Most Recent Value  Chronic Disease   Chronic disease during today's visit Other  [dementia]  General Interventions   General Interventions Discussed/Reviewed General Interventions Reviewed, [facility], Doctor Visits, Labs  [Discussed dementia caregiver support groups, state she is now active with [employer] support group.  Given the name of a book to also help with learning about dementia (The 36 hour day)]  Labs Hgb A1c every 3 months  [A1C currently 8.3]  Doctor Visits Discussed/Reviewed Doctor Visits Reviewed, PCP  [Completely transitioned now to [facility] for primary care]  PCP/Specialist Visits Compliance with follow-up visit  Education Interventions   Education Provided Provided Education  Provided Verbal Education On Blood Sugar Monitoring, Nutrition, Medication, When to see the doctor, Labs  [Working on getting medications changed from [facility] to [facility]]  Labs Reviewed Hgb A1c  Nutrition Interventions   Nutrition Discussed/Reviewed Nutrition Reviewed, Carbohydrate meal planning, Adding fruits and vegetables, Decreasing sugar intake  [Daughter has been able to decrease soda intake but working to decrease more of the sugars in her diet]              SDOH assessments and interventions completed:  No     Care Coordination Interventions:  Yes, provided   Follow up plan: Follow up call scheduled for 1/8     Encounter Outcome:  Patient Visit Completed   Rodney Langton, RN, MSN, CCM [place]  [facility], [facility] Health RN Care Coordinator Direct Dial: [phone] / Main [phone] Fax [phone] Email: [email or web address] Website: [email or web address]

## 2023-08-11 DIAGNOSIS — E785 Hyperlipidemia, unspecified: Secondary | ICD-10-CM | POA: Diagnosis not present

## 2023-08-11 DIAGNOSIS — I10 Essential (primary) hypertension: Secondary | ICD-10-CM | POA: Diagnosis not present

## 2023-08-11 DIAGNOSIS — E1142 Type 2 diabetes mellitus with diabetic polyneuropathy: Secondary | ICD-10-CM | POA: Diagnosis not present

## 2023-08-11 DIAGNOSIS — F0390 Unspecified dementia without behavioral disturbance: Secondary | ICD-10-CM | POA: Diagnosis not present

## 2023-08-16 ENCOUNTER — Telehealth: Payer: Self-pay

## 2023-08-16 NOTE — Telephone Encounter (Signed)
Copied from CRM [id number]. Topic: General - Other >> Aug 16, 2023  9:50 AM Phill Myron wrote: Abby with [facility] will be faxing over a request for a blood glucose monitor and last appointment

## 2023-08-18 ENCOUNTER — Telehealth: Payer: Self-pay | Admitting: *Deleted

## 2023-08-18 NOTE — Patient Outreach (Signed)
  Care Coordination   Follow Up Visit Note   08/18/2023 Name: [name] Ourada MRN: [id number] DOB: [birth year]  [name] [name] is a 89 y.o. year old female who sees Fisher, Demetrios Isaacs, MD for primary care. I spoke with Rosey Bath, daughter of [name] [name] by phone today.  What matters to the patients health and wellness today?  Incoming call from daughter stating patient was having a bad day, ready to explore other options for management of patient.  She is open to having conversation with CSW.     Goals Addressed             This Visit's Progress    Effective management of chronic conditions   On track    Interventions Today    Flowsheet Row Most Recent Value  Chronic Disease   Chronic disease during today's visit Other  [dementia]  General Interventions   General Interventions Discussed/Reviewed Level of Care, Communication with  Communication with Social Work  [CSW advised that daughter is now interested in other options for care of patient's dementia]  Level of Care Personal Care Services, Skilled Nursing Facility, Assisted Living, Adult Daycare  [Discussed options for patients with Dementia with daughter, CSW will discuss further]              SDOH assessments and interventions completed:  No     Care Coordination Interventions:  Yes, provided   Follow up plan: Follow up call scheduled for 1/8. CSW to call tomorrow morning    Encounter Outcome:  Patient Visit Completed   Rodney Langton, RN, MSN, CCM [place]  [facility], [facility] Health RN Care Coordinator Direct Dial: [phone] / Main [phone] Fax [phone] Email: [email or web address] Website: [email or web address]

## 2023-08-19 ENCOUNTER — Ambulatory Visit: Payer: Self-pay | Admitting: *Deleted

## 2023-08-20 NOTE — Patient Instructions (Signed)
Visit Information  Thank you for taking time to visit with me today. Please don't hesitate to contact me if I can be of assistance to you.   Following are the goals we discussed today:   Goals Addressed             This Visit's Progress    Adult Day Care Resources       Activities and task to complete in order to accomplish goals.   LEVELS OF CARE TASK Review information provided regarding local Adult Day Programs[facility] CSW to place a referral for respite care through [facility] [phone]         Our next appointment is by telephone on 09/02/23 at 9am  Please call the care guide team at [phone] if you need to cancel or reschedule your appointment.   If you are experiencing a Mental Health or Behavioral Health Crisis or need someone to talk to, please call the [facility]: [phone] call 911   Patient verbalizes understanding of instructions and care plan provided today and agrees to view in MyChart. Active MyChart status and patient understanding of how to access instructions and care plan via MyChart confirmed with patient.     Telephone follow up appointment with care management team member scheduled for: 09/02/23  Verna Czech, LCSW [place]  [facility], [facility] Licensed Clinical Social Worker Care Coordinator  Direct Dial: [phone]

## 2023-08-20 NOTE — Patient Outreach (Signed)
  Care Coordination   Initial Visit Note   08/20/2023 Name: Cyprus M Gentile MRN: [id number] DOB: 10/27/1932  Cyprus M Stettner is a 89 y.o. year old female who sees Fisher, Demetrios Isaacs, MD for primary care. I spoke with  Cyprus M Molzahn 's daughter Rosey Bath  by phone on 08/19/23  What matters to the patients health and wellness today?  Patient's daughter requesting resources for respite care and  adult day programs for assistance with patient's daily care.   Goals Addressed             This Visit's Progress    Adult Day Care Resources       Activities and task to complete in order to accomplish goals.   LEVELS OF CARE TASK Review information provided regarding local Adult [facility] CSW to place a referral for respite care through [facility] [phone]         SDOH assessments and interventions completed:  Yes  SDOH Interventions Today    Flowsheet Row Most Recent Value  SDOH Interventions   Food Insecurity Interventions Intervention Not Indicated  Housing Interventions Intervention Not Indicated  Transportation Interventions Intervention Not Indicated  Utilities Interventions Intervention Not Indicated  Social Connections Interventions Intervention Not Indicated        Care Coordination Interventions:  Yes, provided  Interventions Today    Flowsheet Row Most Recent Value  Chronic Disease   Chronic disease during today's visit Other  [dementia]  General Interventions   General Interventions Discussed/Reviewed General Interventions Discussed, [facility], Level of Care, Communication with  [employer] assessment completed, pt's daughter discussed behavioral concerns, increased episodes of agitation disorganized in thoughts, beginning to wander-behavior seems to fluctuate]  Communication with --  [senior resources of [place] respite care-confirmed waiting list of 40 people, recommended contacting [facility] out of [place]]   Level of Care Adult Daycare, Assisted Living, Skilled Nursing Facility, Personal Care Services  [discussed available options for care-additional information to be emailed to patient's daughter to review-pt currently refusing above options, however daughter will continue to consider if needed in the near future]  Education Interventions   Education Provided Provided Education  Provided Verbal Education On [facility]  [facility placement and day program enrollment process]  Mental Health Interventions   Mental Health Discussed/Reviewed Mental Health Discussed  Teacher, English as a foreign language stress acknowledged, emotional support provided, self care emphasized]       Follow up plan: Follow up call scheduled for 09/02/23    Encounter Outcome:  Patient Visit Completed

## 2023-08-20 NOTE — Telephone Encounter (Signed)
[name] [name] with [facility] checking on status of glucose monitor for pt. Please call back

## 2023-08-23 DIAGNOSIS — F03B11 Unspecified dementia, moderate, with agitation: Secondary | ICD-10-CM | POA: Diagnosis not present

## 2023-08-23 DIAGNOSIS — E119 Type 2 diabetes mellitus without complications: Secondary | ICD-10-CM | POA: Diagnosis not present

## 2023-08-23 DIAGNOSIS — I10 Essential (primary) hypertension: Secondary | ICD-10-CM | POA: Diagnosis not present

## 2023-08-25 ENCOUNTER — Other Ambulatory Visit: Payer: Self-pay | Admitting: Family Medicine

## 2023-08-25 DIAGNOSIS — E1149 Type 2 diabetes mellitus with other diabetic neurological complication: Secondary | ICD-10-CM

## 2023-09-01 DIAGNOSIS — F0390 Unspecified dementia without behavioral disturbance: Secondary | ICD-10-CM | POA: Diagnosis not present

## 2023-09-01 DIAGNOSIS — E785 Hyperlipidemia, unspecified: Secondary | ICD-10-CM | POA: Diagnosis not present

## 2023-09-01 DIAGNOSIS — I10 Essential (primary) hypertension: Secondary | ICD-10-CM | POA: Diagnosis not present

## 2023-09-01 DIAGNOSIS — E1142 Type 2 diabetes mellitus with diabetic polyneuropathy: Secondary | ICD-10-CM | POA: Diagnosis not present

## 2023-09-02 ENCOUNTER — Other Ambulatory Visit: Payer: Self-pay | Admitting: Family Medicine

## 2023-09-02 ENCOUNTER — Ambulatory Visit: Payer: Self-pay | Admitting: *Deleted

## 2023-09-02 DIAGNOSIS — E1149 Type 2 diabetes mellitus with other diabetic neurological complication: Secondary | ICD-10-CM

## 2023-09-02 DIAGNOSIS — I10 Essential (primary) hypertension: Secondary | ICD-10-CM

## 2023-09-02 NOTE — Patient Instructions (Signed)
Visit Information  Thank you for taking time to visit with me today. Please don't hesitate to contact me if I can be of assistance to you.   Following are the goals we discussed today:   Goals Addressed             This Visit's Progress    Adult Day Care Resources       Activities and task to complete in order to accomplish goals.   LEVELS OF CARE TASK Continue to review information provided regarding local Adult [facility] Programs[facility] CSW to place a referral for respite care through [facility] [phone]-please expect a call from them to enroll Please contact the Alzheimer's Association [phone] for additional commmunity resources and support         Our next appointment is by telephone on 09/27/23 at 9am  Please call the care guide team at [phone] if you need to cancel or reschedule your appointment.   If you are experiencing a Mental Health or Behavioral Health Crisis or need someone to talk to, please call 911   Patient verbalizes understanding of instructions and care plan provided today and agrees to view in MyChart. Active MyChart status and patient understanding of how to access instructions and care plan via MyChart confirmed with patient.     Telephone follow up appointment with care management team member scheduled for: 09/27/23  Verna Czech, LCSW [place]  [facility], [facility] Health Licensed Clinical Social Worker Care Coordinator  Direct Dial: [phone]

## 2023-09-02 NOTE — Telephone Encounter (Signed)
Requested Prescriptions  Pending Prescriptions Disp Refills   furosemide (LASIX) 20 MG tablet [Pharmacy Med Name: Furosemide Oral Tablet 20 MG] 90 tablet 0    Sig: TAKE 1 TABLET EVERY DAY AS NEEDED     Cardiovascular:  Diuretics - Loop Failed - 09/02/2023  9:47 AM      Failed - K in normal range and within 180 days    Potassium  Date Value Ref Range Status  02/04/2023 4.0 3.5 - 5.2 mmol/L Final         Failed - Ca in normal range and within 180 days    Calcium  Date Value Ref Range Status  02/04/2023 9.7 8.7 - 10.3 mg/dL Final         Failed - Na in normal range and within 180 days    Sodium  Date Value Ref Range Status  02/04/2023 143 134 - 144 mmol/L Final         Failed - Cr in normal range and within 180 days    Creat  Date Value Ref Range Status  07/06/2017 0.90 (H) 0.60 - 0.88 mg/dL Final    Comment:    For patients >35 years of age, the reference limit for Creatinine is approximately 13% higher for people identified as African-American. .    Creatinine, Ser  Date Value Ref Range Status  02/04/2023 0.87 0.57 - 1.00 mg/dL Final   Creatinine, POC  Date Value Ref Range Status  02/26/2017 n/a mg/dL Final         Failed - Cl in normal range and within 180 days    Chloride  Date Value Ref Range Status  02/04/2023 104 96 - 106 mmol/L Final         Failed - Mg Level in normal range and within 180 days    No results found for: "MG"       Failed - Last BP in normal range    BP Readings from Last 1 Encounters:  05/10/23 (!) 175/79         Passed - Valid encounter within last 6 months    Recent Outpatient Visits           3 months ago Diabetes mellitus type 2 with neurological manifestations (HCC)   [place] [facility] Malva Limes, MD   6 months ago Moderate dementia with other behavioral disturbance, unspecified dementia type (HCC)   [place] [facility] Limes, MD   7 months ago Functional diarrhea    [facility] Limes, MD   11 months ago B12 deficiency   [facility] L[facility], MD   1 year ago Primary hypertension   [place] [facility] Malva Limes, MD               glipiZIDE (GLUCOTROL XL) 5 MG 24 hr tablet [Pharmacy Med Name: glipiZIDE ER Oral Tablet Extended Release 24 Hour 5 MG] 90 tablet 0    Sig: TAKE 1 TABLET EVERY DAY     Endocrinology:  Diabetes - Sulfonylureas Failed - 09/02/2023  9:47 AM      Failed - HBA1C is between 0 and 7.9 and within 180 days    Hgb A1c MFr Bld  Date Value Ref Range Status  02/04/2023 9.0 (H) 4.8 - 5.6 % Final    Comment:             Prediabetes: 5.7 - 6.4  Diabetes: >6.4          Glycemic control for adults with diabetes: <7.0          Passed - Cr in normal range and within 360 days    Creat  Date Value Ref Range Status  07/06/2017 0.90 (H) 0.60 - 0.88 mg/dL Final    Comment:    For patients >39 years of age, the reference limit for Creatinine is approximately 13% higher for people identified as African-American. .    Creatinine, Ser  Date Value Ref Range Status  02/04/2023 0.87 0.57 - 1.00 mg/dL Final   Creatinine, POC  Date Value Ref Range Status  02/26/2017 n/a mg/dL Final         Passed - Valid encounter within last 6 months    Recent Outpatient Visits           3 months ago Diabetes mellitus type 2 with neurological manifestations [facility])   [place] [facility] Malva Limes, MD   6 months ago Moderate dementia with other behavioral disturbance, unspecified dementia type [facility])   [facility] Malva Limes, MD   7 months ago Functional diarrhea   [facility] L[facility], MD   11 months ago B12 deficiency   [facility] Malva L[facility], MD   1 year ago Primary hypertension   [place] [facility] Malva Limes, MD

## 2023-09-02 NOTE — Patient Outreach (Addendum)
  Care Coordination   Follow Up Visit Note   09/02/2023 Name: [name] [name] MRN: [id number] DOB: 1933-02-24  [name] [name] is a 89 y.o. year old female who sees Fisher, Demetrios Isaacs, MD for primary care. I spoke with  [name] [name] by phone today.  What matters to the patients health and wellness today?  Patient's  daughter continues to be the primary caregiver for patient and patient's spouse. Daughter requesting resources for respite care ,adult day programs  and any other additional support for assistance with patient's daily care.    Goals Addressed             This Visit's Progress    Adult Day Care Resources       Activities and task to complete in order to accomplish goals.   LEVELS OF CARE TASK Continue to review information provided regarding local [facility] CSW to place a referral for respite care through [facility] [phone]-please expect a call from them to enroll Please contact the Alzheimer's Association [phone] for additional commmunity resources and support         SDOH assessments and interventions completed:  No     Care Coordination Interventions:  Yes, provided  Interventions Today    Flowsheet Row Most Recent Value  Chronic Disease   Chronic disease during today's visit Other  [dementia]  General Interventions   General Interventions Discussed/Reviewed General Interventions Reviewed, Doctor Visits, Community Resources, Level of Care  [follow up phone call to patient's daughter  regarding community resources previously provided-confirmed that patient's condition continues to progress and additional services are needed in the home]  Doctor Visits Discussed/Reviewed Doctor Visits Discussed  [Currently followed by [facility]-last visit 08/23/23-will return after the holiday]  Level of Care --  [Pt's daughter encouraged to follow up on Adult Day Care Programs-Referral completed for respite care  with [facility]]  Education Interventions   Education Provided Provided Education  Provided Verbal Education On Community Resources  [employer] Program requires payment upfront to be reimbursed]       Follow up plan: Follow up call scheduled for 09/26/22    Encounter Outcome:  Patient Visit Completed

## 2023-09-22 ENCOUNTER — Ambulatory Visit: Payer: Self-pay | Admitting: *Deleted

## 2023-09-22 NOTE — Patient Outreach (Signed)
  Care Coordination   Follow Up Visit Note   09/22/2023 Name: [name] [name]  GRACELYN COVENTRY MRN: [id number] DOB: 03-27-1933  [name] [name]  M [name] is a 89 y.o. year old female who sees Fisher, Nancyann BRAVO, MD for primary care. I spoke with [name] [name] [name], daughter of [name] [name]  FEIGE [name] by phone today.  What matters to the patients health and wellness today?  Dementia is progressing, daughter working on additional support, particularly respite.  Will discuss disease progression with provider tomorrow.     Goals Addressed             This Visit's Progress    Effective management of chronic conditions   On track    Interventions Today    Flowsheet Row Most Recent Value  Chronic Disease   Chronic disease during today's visit Other  [dementia]  General Interventions   General Interventions Discussed/Reviewed Level of Care, Communication with  Communication with Social Work  [CSW advised that daughter is now interested in other options for care of patient's dementia]  Level of Care Personal Care Services, Skilled Nursing Facility, Assisted Living, Adult Daycare  [Discussed options for patients with Dementia with daughter, CSW will discuss further]              SDOH assessments and interventions completed:  No     Care Coordination Interventions:  Yes, provided   Follow up plan: Follow up call scheduled for 2/12    Encounter Outcome:  Patient Visit Completed   Odella Ku, RN, MSN, CCM [place]  [facility], [facility] Health RN Care Coordinator Direct Dial: [phone] / Main [phone] Fax [phone] Email: [email or web address] Website: [email or web address]

## 2023-09-23 DIAGNOSIS — F03918 Unspecified dementia, unspecified severity, with other behavioral disturbance: Secondary | ICD-10-CM | POA: Diagnosis not present

## 2023-09-23 DIAGNOSIS — I10 Essential (primary) hypertension: Secondary | ICD-10-CM | POA: Diagnosis not present

## 2023-09-23 DIAGNOSIS — E119 Type 2 diabetes mellitus without complications: Secondary | ICD-10-CM | POA: Diagnosis not present

## 2023-09-23 DIAGNOSIS — Z9183 Wandering in diseases classified elsewhere: Secondary | ICD-10-CM | POA: Diagnosis not present

## 2023-09-27 ENCOUNTER — Ambulatory Visit: Payer: Self-pay | Admitting: *Deleted

## 2023-09-27 DIAGNOSIS — E785 Hyperlipidemia, unspecified: Secondary | ICD-10-CM | POA: Diagnosis not present

## 2023-09-27 DIAGNOSIS — F0390 Unspecified dementia without behavioral disturbance: Secondary | ICD-10-CM | POA: Diagnosis not present

## 2023-09-27 DIAGNOSIS — E1142 Type 2 diabetes mellitus with diabetic polyneuropathy: Secondary | ICD-10-CM | POA: Diagnosis not present

## 2023-09-27 DIAGNOSIS — I10 Essential (primary) hypertension: Secondary | ICD-10-CM | POA: Diagnosis not present

## 2023-09-27 NOTE — Patient Instructions (Signed)
 Visit Information  Thank you for taking time to visit with me today. Please don't hesitate to contact me if I can be of assistance to you.   Following are the goals we discussed today:   Goals Addressed             This Visit's Progress    Community  Resource Needs       Activities and task to complete in order to accomplish goals.   LEVELS OF CARE TASK Please follow up with application process for respite care through [facility] [phone] Please contact the Alzheimer's Association [phone] for additional commmunity resources and support         Our next appointment is by telephone on 10/28/23 at 9am  Please call the care guide team at [phone] if you need to cancel or reschedule your appointment.   If you are experiencing a Mental Health or Behavioral Health Crisis or need someone to talk to, please call 911   Patient verbalizes understanding of instructions and care plan provided today and agrees to view in MyChart. Active MyChart status and patient understanding of how to access instructions and care plan via MyChart confirmed with patient.     Telephone follow up appointment with care management team member scheduled for: 10/28/23  Lenn Mean, LCSW [place]  [facility] Institute, [facility] Health Licensed Clinical Social Worker Care Coordinator  Direct Dial: [phone]

## 2023-09-27 NOTE — Patient Outreach (Signed)
  Care Coordination   Follow Up Visit Note   09/27/2023 Name: [name] [name]  SYLVI RYBOLT MRN: [id number] DOB: 09-23-1932  [name]  M [name] is a 89 y.o. year old female who sees Fisher, Nancyann BRAVO, MD for primary care. I spoke with  Ronell  BRYNDLE CORREDOR 's daughter Verneita Kitty by phone today.  What matters to the patients health and wellness today?  Respite care options for patient and patient's spouse    Goals Addressed             This Visit's Progress    Community  Resource Needs       Activities and task to complete in order to accomplish goals.   LEVELS OF CARE TASK Please follow up with application process for respite care through [facility] [phone] Please contact the Alzheimer's [employer] [phone] for additional commmunity resources and support         SDOH assessments and interventions completed:  No     Care Coordination Interventions:  Yes, provided  Interventions Today    Flowsheet Row Most Recent Value  Chronic Disease   Chronic disease during today's visit Other  [dementia]  General Interventions   General Interventions Discussed/Reviewed --  [follow up v]  Doctor Visits Discussed/Reviewed Doctor Visits Discussed, PCP  [[facility]-home medical care-last visit 09/23/23]  PCP/Specialist Visits Compliance with follow-up visit  Level of Care Adult Daycare  [Adult Day Care options discussed, confirmed receiving phone call from [facility] respite program-program application process discussed-[facility] will  contact daughter regarding status of application]  Education Interventions   Education Provided Provided Education  Provided Verbal Education On [employer]  [discussed follow upwith the Alzheimer's Association [phone] for additional commmunity resources and support]       Follow up plan: Follow up call scheduled for 10/28/23    Encounter Outcome:  Patient Visit Completed

## 2023-10-21 DIAGNOSIS — R195 Other fecal abnormalities: Secondary | ICD-10-CM | POA: Diagnosis not present

## 2023-10-21 DIAGNOSIS — F0390 Unspecified dementia without behavioral disturbance: Secondary | ICD-10-CM | POA: Diagnosis not present

## 2023-10-22 DIAGNOSIS — K562 Volvulus: Secondary | ICD-10-CM | POA: Diagnosis not present

## 2023-10-22 DIAGNOSIS — R195 Other fecal abnormalities: Secondary | ICD-10-CM | POA: Diagnosis not present

## 2023-10-27 ENCOUNTER — Ambulatory Visit: Payer: Self-pay | Admitting: *Deleted

## 2023-10-27 DIAGNOSIS — E1142 Type 2 diabetes mellitus with diabetic polyneuropathy: Secondary | ICD-10-CM | POA: Diagnosis not present

## 2023-10-27 DIAGNOSIS — E785 Hyperlipidemia, unspecified: Secondary | ICD-10-CM | POA: Diagnosis not present

## 2023-10-27 DIAGNOSIS — F0390 Unspecified dementia without behavioral disturbance: Secondary | ICD-10-CM | POA: Diagnosis not present

## 2023-10-27 DIAGNOSIS — I10 Essential (primary) hypertension: Secondary | ICD-10-CM | POA: Diagnosis not present

## 2023-10-27 NOTE — Patient Outreach (Signed)
  Care Coordination   Follow Up Visit Note   10/27/2023 Name: [name] [name] MRN: [id number] DOB: 16-Nov-1932  [name] [name] is a 89 y.o. year old female who sees Fisher, Demetrios Isaacs, MD for primary care. I spoke with Rosey Bath, daughter of [name] [name] by phone today.  What matters to the patients health and wellness today?  Daughter state patient is stable, concern remains ability to have access to respite care.  Her community options are now halted with the hold on federal funding, but she remains in contact with them and LCSW for alternatives.     Goals Addressed             This Visit's Progress    Effective management of chronic conditions   On track    Interventions Today    Flowsheet Row Most Recent Value  Chronic Disease   Chronic disease during today's visit Diabetes, Hypertension (HTN), Other  [dementia]  General Interventions   General Interventions Discussed/Reviewed General Interventions Reviewed, Doctor Visits  Doctor Visits Discussed/Reviewed PCP  PCP/Specialist Visits Compliance with follow-up visit  [reviewed [place]: CSW tomorrow, has routine monthly visits by PCP team at [facility]]  Education Interventions   Education Provided Provided Education  Provided Verbal Education On Blood Sugar Monitoring, Medication, When to see the doctor  [facility] reviewed, daughter continues to look for respite care, however her options are now limited with hold on federal funding.]              SDOH assessments and interventions completed:  No     Care Coordination Interventions:  Yes, provided   Follow up plan: Follow up call scheduled for 5/5    Encounter Outcome:  Patient Visit Completed   Rodney Langton, RN, MSN, CCM [place]  [facility], [facility] RN Care Coordinator Direct Dial: [phone] / Main [phone] Fax [phone] Email: [email or web address]: [email or web address]

## 2023-10-28 ENCOUNTER — Ambulatory Visit: Payer: Self-pay | Admitting: *Deleted

## 2023-10-28 NOTE — Patient Outreach (Signed)
  Care Coordination   Follow Up Visit Note   10/28/2023 Name: [name] [name] MRN: [id number] DOB: 12/31/1932  [name] [name] is a 89 y.o. year old female who sees Fisher, Demetrios Isaacs, MD for primary care. I spoke with  [name] [name]'s daughter Michaell Cowing by phone today.  What matters to the patients health and wellness today?  Caregiver support resources    Goals Addressed             This Visit's Progress    Community  Resource Needs       Activities and task to complete in order to accomplish goals.   LEVELS OF CARE TASK Please follow up with application process for respite care through [facility] [phone] Please contact the Alzheimer's Association [phone] for additional commmunity resources and support Please follow up with status of Medicaid application  Please continue to consider caregiver support groups through [facility] care [phone] or [facility] [phone]         SDOH assessments and interventions completed:  No     Care Coordination Interventions:  Yes, provided  Interventions Today    Flowsheet Row Most Recent Value  Chronic Disease   Chronic disease during today's visit Diabetes, Hypertension (HTN), Other  [dementia, caregiver stress]  General Interventions   General Interventions Discussed/Reviewed General Interventions Reviewed, Community Resources, Level of Care  [Follow up with patient's daughter regarding available caregiver support programs]  Doctor Visits Discussed/Reviewed Doctor Visits Reviewed  [continues to be followed by [facility]]  PCP/Specialist Visits Compliance with follow-up visit  Level of Care Applications  Applications Medicaid, Other  [confirmed that medicaid applicatoin is pending for CAP services-daughter agreeable to follow up with respite care referral through [facility]]  Education Interventions   Applications Medicaid, Other  [confirmed that medicaid applicatoin is pending for CAP  services-daughter agreeable to follow up with respite care referral through [facility]]  Mental Health Interventions   Mental Health Discussed/Reviewed Mental Health Reviewed, Coping Strategies  [caregiver stress continues to be acknowledged-self care emphasized-pt's daughter encouraged to contact [facility] to f/u on respite care referral-contact to Alzheimer's Association also provided-support group options also discussed and encouraged]  Safety Interventions   Safety Discussed/Reviewed Safety Discussed  [Has alarm system for safety]  Advanced Directive Interventions   Advanced Directives Discussed/Reviewed Advanced Directives Discussed  [daughter confirms Advanced Directives are now in place]       Follow up plan: No further intervention required.   Encounter Outcome:  Patient Visit Completed

## 2023-10-28 NOTE — Patient Instructions (Signed)
Visit Information  Thank you for taking time to visit with me today. Please don't hesitate to contact me if I can be of assistance to you.   Following are the goals we discussed today:   Goals Addressed             This Visit's Progress    Community  Resource Needs       Activities and task to complete in order to accomplish goals.   LEVELS OF CARE TASK Please follow up with application process for respite care through [facility] [phone] Please contact the Alzheimer's Association [phone] for additional commmunity resources and support Please follow up with status of Medicaid application  Please continue to consider caregiver support groups through [facility] [phone] or [facility] [phone]          If you are experiencing a Mental Health or Behavioral Health Crisis or need someone to talk to, please call 911   Patient verbalizes understanding of instructions and care plan provided today and agrees to view in MyChart. Active MyChart status and patient understanding of how to access instructions and care plan via MyChart confirmed with patient.     No further follow up required: Please contact this Child psychotherapist with any additional community resource needs for caregiver support.  Miki Blank, LCSW [place]  [facility], [facility] Licensed Clinical Social Worker Care Coordinator  Direct Dial: [phone]

## 2023-11-14 ENCOUNTER — Other Ambulatory Visit: Payer: Self-pay | Admitting: Family Medicine

## 2023-11-14 DIAGNOSIS — E1149 Type 2 diabetes mellitus with other diabetic neurological complication: Secondary | ICD-10-CM

## 2023-11-14 DIAGNOSIS — I10 Essential (primary) hypertension: Secondary | ICD-10-CM

## 2023-12-01 ENCOUNTER — Other Ambulatory Visit: Payer: Self-pay | Admitting: Family Medicine

## 2023-12-01 DIAGNOSIS — M159 Polyosteoarthritis, unspecified: Secondary | ICD-10-CM

## 2023-12-01 DIAGNOSIS — R252 Cramp and spasm: Secondary | ICD-10-CM

## 2023-12-01 DIAGNOSIS — M51369 Other intervertebral disc degeneration, lumbar region without mention of lumbar back pain or lower extremity pain: Secondary | ICD-10-CM

## 2023-12-07 ENCOUNTER — Telehealth: Payer: Self-pay | Admitting: Family Medicine

## 2023-12-07 DIAGNOSIS — I10 Essential (primary) hypertension: Secondary | ICD-10-CM

## 2023-12-07 NOTE — Telephone Encounter (Signed)
 [facility] is requesting refill valsartan (DIOVAN) 160 MG tablet  Please advise

## 2023-12-08 MED ORDER — VALSARTAN 160 MG PO TABS
160.0000 mg | ORAL_TABLET | Freq: Every day | ORAL | 1 refills | Status: AC
Start: 2023-12-08 — End: ?

## 2023-12-09 ENCOUNTER — Other Ambulatory Visit: Payer: Self-pay | Admitting: Family Medicine

## 2023-12-22 ENCOUNTER — Telehealth: Payer: Self-pay | Admitting: *Deleted

## 2023-12-22 NOTE — Patient Outreach (Signed)
 Complex Care Management   Visit Note  12/22/2023  Name:  [name] [name] MRN: [id number] DOB: [birth year]  Situation: Referral received for Complex Care Management related to Dementia I obtained verbal consent from daughter.  Visit completed with daughter  on the phone  Background:   Past Medical History:  Diagnosis Date   Allergy    Arthritis    Cancer (HCC)    thryoid   Cataract    Depression 02/2022   Diabetes mellitus without complication (HCC)    GERD (gastroesophageal reflux disease)    Hyperlipidemia    Hypertension    Thyroid disease     Assessment: Received call from daughter, state she is ready to pursue hospice, preference is [facility].  Call placed to [facility] to request referral be sent.  Patient Reported Symptoms:  Cognitive Struggling with memory recall, Confused or disoriented  Neurological Other: Dementia  HEENT Not assessed    Cardiovascular Not assessed    Respiratory Not assesed    Endocrine Not assessed    Gastrointestinal Not assessed    Genitourinary Not assessed    Integumentary Not assessed    Musculoskeletal Not assessed    Psychosocial Not assessed     There were no vitals filed for this visit.  Medications Reviewed Today   Medications were not reviewed in this encounter     Recommendation:   Referral to [facility] for hospice  Follow Up Plan:   Telephone follow up appointment date/time:  4/18 at [facility], RN, MSN, CCM [facility], [facility] RN Care Coordinator Direct Dial: [phone] / Main [phone] Fax [phone] Email: [email or web address] Website: [email or web address]

## 2023-12-31 ENCOUNTER — Encounter: Payer: Self-pay | Admitting: *Deleted

## 2023-12-31 ENCOUNTER — Telehealth: Payer: Self-pay | Admitting: *Deleted

## 2023-12-31 NOTE — Patient Outreach (Signed)
 Outreach made to daughter, no answer, HIPAA compliant voice message left.  Call placed to [facility], confirmed patient became active with hospice on 4/11.  Will close case at this time.   Odella Ku, RN, MSN, CCM [facility], [facility] RN Care Coordinator Direct Dial: [phone] / Main [phone] Fax [phone] Email: [email or web address] Website: [email or web address]

## 2024-01-17 ENCOUNTER — Encounter: Payer: Medicare HMO | Admitting: *Deleted

## 2024-01-28 ENCOUNTER — Encounter: Payer: Self-pay | Admitting: *Deleted

## 2024-02-13 ENCOUNTER — Other Ambulatory Visit: Payer: Self-pay | Admitting: Family Medicine

## 2024-02-13 DIAGNOSIS — F03B18 Unspecified dementia, moderate, with other behavioral disturbance: Secondary | ICD-10-CM

## 2024-03-19 ENCOUNTER — Other Ambulatory Visit: Payer: Self-pay | Admitting: Family Medicine

## 2024-05-03 ENCOUNTER — Other Ambulatory Visit: Payer: Self-pay | Admitting: Family Medicine

## 2024-06-24 ENCOUNTER — Other Ambulatory Visit: Payer: Self-pay | Admitting: Family Medicine

## 2024-06-28 ENCOUNTER — Encounter: Payer: Self-pay | Admitting: Emergency Medicine
